# Patient Record
Sex: Female | Born: 1940
Health system: Southern US, Community
[De-identification: ages and names within clinical notes are randomized; demographics above are authoritative.]

## PROBLEM LIST (undated history)

## (undated) DIAGNOSIS — F32A Depression, unspecified: Secondary | ICD-10-CM

## (undated) DIAGNOSIS — Z973 Presence of spectacles and contact lenses: Secondary | ICD-10-CM

## (undated) DIAGNOSIS — D126 Benign neoplasm of colon, unspecified: Secondary | ICD-10-CM

## (undated) DIAGNOSIS — E210 Primary hyperparathyroidism: Secondary | ICD-10-CM

## (undated) DIAGNOSIS — K760 Fatty (change of) liver, not elsewhere classified: Secondary | ICD-10-CM

## (undated) DIAGNOSIS — T4145XA Adverse effect of unspecified anesthetic, initial encounter: Secondary | ICD-10-CM

## (undated) DIAGNOSIS — I10 Essential (primary) hypertension: Secondary | ICD-10-CM

## (undated) DIAGNOSIS — N189 Chronic kidney disease, unspecified: Secondary | ICD-10-CM

## (undated) DIAGNOSIS — Z972 Presence of dental prosthetic device (complete) (partial): Secondary | ICD-10-CM

## (undated) DIAGNOSIS — T8859XA Other complications of anesthesia, initial encounter: Secondary | ICD-10-CM

## (undated) DIAGNOSIS — M1990 Unspecified osteoarthritis, unspecified site: Secondary | ICD-10-CM

## (undated) DIAGNOSIS — K219 Gastro-esophageal reflux disease without esophagitis: Secondary | ICD-10-CM

## (undated) DIAGNOSIS — E785 Hyperlipidemia, unspecified: Secondary | ICD-10-CM

## (undated) DIAGNOSIS — E119 Type 2 diabetes mellitus without complications: Secondary | ICD-10-CM

## (undated) DIAGNOSIS — R55 Syncope and collapse: Secondary | ICD-10-CM

## (undated) DIAGNOSIS — K589 Irritable bowel syndrome without diarrhea: Secondary | ICD-10-CM

## (undated) DIAGNOSIS — K648 Other hemorrhoids: Secondary | ICD-10-CM

## (undated) DIAGNOSIS — F329 Major depressive disorder, single episode, unspecified: Secondary | ICD-10-CM

## (undated) DIAGNOSIS — F419 Anxiety disorder, unspecified: Secondary | ICD-10-CM

## (undated) HISTORY — PX: OTHER SURGICAL HISTORY: SHX169

## (undated) HISTORY — DX: Depression, unspecified: F32.A

## (undated) HISTORY — DX: Benign neoplasm of colon, unspecified: D12.6

## (undated) HISTORY — PX: ANKLE FRACTURE SURGERY: SHX122

## (undated) HISTORY — DX: Unspecified osteoarthritis, unspecified site: M19.90

## (undated) HISTORY — DX: Gastro-esophageal reflux disease without esophagitis: K21.9

## (undated) HISTORY — DX: Hyperlipidemia, unspecified: E78.5

## (undated) HISTORY — DX: Chronic kidney disease, unspecified: N18.9

## (undated) HISTORY — DX: Type 2 diabetes mellitus without complications: E11.9

## (undated) HISTORY — DX: Major depressive disorder, single episode, unspecified: F32.9

## (undated) HISTORY — PX: TONSILLECTOMY: SUR1361

## (undated) HISTORY — DX: Irritable bowel syndrome, unspecified: K58.9

## (undated) HISTORY — DX: Other hemorrhoids: K64.8

## (undated) HISTORY — DX: Essential (primary) hypertension: I10

## (undated) HISTORY — DX: Fatty (change of) liver, not elsewhere classified: K76.0

## (undated) HISTORY — DX: Anxiety disorder, unspecified: F41.9

## (undated) HISTORY — PX: COLONOSCOPY W/ BIOPSIES AND POLYPECTOMY: SHX1376

---

## 1962-08-08 HISTORY — PX: VAGINAL HYSTERECTOMY: SUR661

## 1997-08-08 DIAGNOSIS — D126 Benign neoplasm of colon, unspecified: Secondary | ICD-10-CM

## 1997-08-08 HISTORY — DX: Benign neoplasm of colon, unspecified: D12.6

## 1998-03-19 ENCOUNTER — Other Ambulatory Visit: Admission: RE | Admit: 1998-03-19 | Discharge: 1998-03-19 | Payer: Self-pay | Admitting: Internal Medicine

## 2005-05-11 ENCOUNTER — Ambulatory Visit: Payer: Self-pay | Admitting: Internal Medicine

## 2006-11-11 ENCOUNTER — Encounter: Admission: RE | Admit: 2006-11-11 | Discharge: 2006-11-11 | Payer: Self-pay | Admitting: Orthopedic Surgery

## 2006-12-07 ENCOUNTER — Encounter: Admission: RE | Admit: 2006-12-07 | Discharge: 2006-12-07 | Payer: Self-pay | Admitting: Orthopedic Surgery

## 2007-01-04 ENCOUNTER — Ambulatory Visit: Payer: Self-pay | Admitting: Internal Medicine

## 2007-01-18 ENCOUNTER — Ambulatory Visit: Payer: Self-pay | Admitting: Internal Medicine

## 2007-01-18 ENCOUNTER — Encounter: Payer: Self-pay | Admitting: Internal Medicine

## 2007-08-09 HISTORY — PX: LAMINECTOMY: SHX219

## 2007-10-29 ENCOUNTER — Encounter: Admission: RE | Admit: 2007-10-29 | Discharge: 2007-10-29 | Payer: Self-pay | Admitting: Orthopedic Surgery

## 2007-12-25 ENCOUNTER — Ambulatory Visit: Payer: Self-pay | Admitting: Cardiology

## 2008-03-19 ENCOUNTER — Inpatient Hospital Stay (HOSPITAL_COMMUNITY): Admission: RE | Admit: 2008-03-19 | Discharge: 2008-03-21 | Payer: Self-pay | Admitting: Orthopedic Surgery

## 2009-03-19 ENCOUNTER — Encounter: Admission: RE | Admit: 2009-03-19 | Discharge: 2009-03-19 | Payer: Self-pay | Admitting: Family Medicine

## 2010-04-19 ENCOUNTER — Encounter: Admission: RE | Admit: 2010-04-19 | Discharge: 2010-04-19 | Payer: Self-pay | Admitting: Family Medicine

## 2010-08-30 ENCOUNTER — Encounter: Payer: Self-pay | Admitting: Family Medicine

## 2010-12-21 NOTE — Op Note (Signed)
NAME:  Carolyn [name], Carolyn [name]                 ACCOUNT NO.:  [id number]   MEDICAL RECORD NO.:  [id number]          PATIENT TYPE:  INP   LOCATION:  0005                         FACILITY:  [facility]   PHYSICIAN:  Georges Lynch. Gioffre, M.D.DATE OF BIRTH:  [birth year]   DATE OF PROCEDURE:  03/19/2008  DATE OF DISCHARGE:                               OPERATIVE REPORT   SURGEON:  Georges Lynch. Darrelyn Hillock, M.D.   ASSISTANT:  Marlowe Kays, M.D.   PREOPERATIVE DIAGNOSES:  1. Severe spinal stenosis at L4-5.  2. Questionable herniated disk at L4-5 on the left.  3. Foraminal stenosis at L4-5 for the L4 root and L5 root bilaterally.   POSTOPERATIVE DIAGNOSES:  1. Severe spinal stenosis at L4-5.  2. Questionable herniated disk at L4-5 on the left.  3. Foraminal stenosis at L4-5 for the L4 root and L5 root bilaterally.   OPERATION:  1. Complete decompression of L4 spinous process and the lamina.  2. Hemilaminectomy bilaterally of L5.  3. Foraminotomies for the L5 root and the L4 root bilaterally.   PROCEDURE IN DETAIL:  Under general anesthesia with the patient on  spinal frame, routine orthopedic prep and draping of the lower back was  carried out.  She had 2 g of IV Ancef preop.  At this time two needles  were placed in the back for localization purposes.  X-ray was taken to  verify the position.  Incision then was made over the L4-5 interspace  and extended distally.  Also extended the incision somewhat proximal.  I  then stripped the muscle from the spinous process and lamina  bilaterally.  Another x-ray was taken to verify our position.  Several x-  rays were taken to verify the exact L4-5 position.  At this time I then  removed the spinous process of L4, part of L3.  I did a complete  decompression by removing the complete lamina of L4.  I then went down  and did a hemilaminectomy bilaterally of L5.  A microscope was brought  in at this time and we identified the ligamentum flavum.  We removed  that with  great care not to injure the underlying dura.  Note she had  rather severe thickening of the ligamentum flavum at the L4-5 space.  When the decompression was carried out proximally and distally we  utilized a hockey-stick to go proximally and distally to make sure there  was no further blockage proximally or distally.  We completed the  procedure and it was noted the dura now was free.  We then went out  laterally and decompressed the foramina bilaterally.  We then examined  the L4-5 space.  She had a pseudo spondylolisthesis at L4-5.  An  instrument was placed at the space and we took an x-ray to verify this  position.  Note there was no soft disk herniation.  This was hard thick  bone and ligamentum flavum that was causing the indentation on the left  side.  We had the roots decompressed thoroughly bilaterally.  The dura  now was thoroughly decompressed.  We thoroughly irrigated out  the area,  removed the fluid and then loosely applied some FloSeal.  Following that  I then utilized some thrombin-soaked  Gelfoam out laterally.  The wound then was closed in layers in the usual  fashion except I left a small deep distal part of the wound open for  drainage purposes.  Subcu was closed with 0 Vicryl, skin with metal  staples.  A sterile Neosporin dressing was applied.           ______________________________  Georges Lynch Darrelyn Hillock, M.D.     RAG/MEDQ  D:  03/19/2008  T:  03/19/2008  Job:  [id number]   cc:   Donzetta Sprung  Fax: [phone]   Jonelle Sidle, MD  [phone] [address]  [place], [place] [address]

## 2010-12-21 NOTE — Assessment & Plan Note (Signed)
[facility]                          [facility] OFFICE NOTE   Carolyn, [name]                        MRN:          [id number]  DATE:12/25/2007                            DOB:          03/09/1941    REFERRING PHYSICIAN:  Georges Lynch. Darrelyn Hillock, M.D.   REASON FOR VISIT:  Preoperative evaluation.   HISTORY OF PRESENT ILLNESS:  Carolyn [name] is a pleasant 70 year old woman  with a history of hypertension.  She has evidence of spinal stenosis  with bilateral leg pain and is being considered for surgical  decompression and microdiskectomy sometime in July.  She is referred for  a preoperative evaluation.  Speaking with her regarding symptoms, she  denies any typical exertional angina or limiting breathlessness beyond  NYHA class II.  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  She is mainly limited by back and leg pain.  She does  state that she has decreased energy at times, although no chest pain,  and notes that her heart rate has been relatively slow, down in the  40s.  She is on metoprolol 100 mg daily, and has been on this medicine  for many years.  She has had no sense of dizziness, palpitations or  syncope, however.  Her electrocardiogram today shows sinus bradycardia  at 45 beats per minute; otherwise normal.  There is no old tracing for  comparison.   ALLERGIES:  NO KNOWN DRUG ALLERGIES.   PRESENT MEDICATIONS:  1. Benicar HCT 40/12.5 mg p.o. daily.  2. Metoprolol 100 mg p.o. daily.  3. Citalopram 20 mg p.o. daily.  4. Celebrex 10 mg p.o. daily.  5. Nexium 40 mg p.o. daily.  6. Vitamin D daily.  7. Premarin 0.3 mg p.o. every other day.  8. Vicodin p.r.n.  9. Alprazolam p.r.n.   PAST MEDICAL HISTORY:  As outlined above.  No history of diabetes  mellitus or significant hyperlipidemia.  Thyroid status has been normal  with TSH of 1.42 back in 2005 based on available records.  She is status  post hysterectomy and  has history of previous ankle fracture.   SOCIAL HISTORY:  The patient is married.  She denies any recent tobacco  use.  She quit smoking greater than 20 years ago.  No history of regular  alcohol use.  No regular exercise.   FAMILY HISTORY:  Family history is reviewed.  Significant for type 2  diabetes mellitus.  No premature cardiovascular disease noted.   REVIEW OF SYSTEMS:  As outlined above.  Otherwise negative.   PHYSICAL EXAMINATION:  VITAL SIGNS:  Blood pressure is 135/78, heart  rate is 48, weight is 186 pounds.  GENERAL:  A normally nourished woman in no acute distress.  HEENT:  Conjunctivae normal.  Oropharynx is clear.  NECK:  Supple.  No elevated jugular venous pressure, no loud bruits, no  thyromegaly.  LUNGS:  Clear without labored breathing.  CARDIAC:  Regular rate and rhythm.  No significant murmur or S3 gallop.  No pericardial rub.  ABDOMEN:  Soft, nontender.  Normoactive  bowel sounds.  EXTREMITIES:  Significant for no frank pitting edema.  Distal pulses are  2+.  SKIN:  Warm and dry.  MUSCULOSKELETAL:  No kyphosis noted.  NEUROPSYCHIATRIC:  The patient is alert and oriented x3.  Affect is  appropriate.   IMPRESSION AND RECOMMENDATIONS:  Preoperative evaluation in a 66-year-  old woman with a history of hypertension and relative bradycardia.  She  has had no dizziness or syncope, but I suspect some of her decrease in  energy and exercise tolerance is related to this.  She is not reporting  any angina or heart failure symptoms, and her electrocardiogram  otherwise is reassuring.  I would anticipate that she should be able to  proceed with planned spine surgery under general anesthesia with an  acceptable perioperative cardiac risk.  I would like her to decrease her  metoprolol dose to 50 mg daily.  She plans to see Dr. Reuel Boom for a  routine visit also in the near future.  I have asked her to keep an eye  on her heart rate going forward in case she needs further  medication  adjustments.  We can certainly see her in the perioperative setting as  needed.     Jonelle Sidle, MD  Electronically Signed    SGM/MedQ  DD: 12/25/2007  DT: 12/25/2007  Job #: [id number]   cc:   Georges Lynch. Darrelyn Hillock, M.D.  Donzetta Sprung

## 2010-12-24 NOTE — H&P (Signed)
NAME:  Carolyn, [name]                 ACCOUNT NO.:  [id number]   MEDICAL RECORD NO.:  [id number]         PATIENT TYPE:  LINP   LOCATION:                               FACILITY:  [facility]   PHYSICIAN:  Georges Lynch. Gioffre, M.D.DATE OF BIRTH:  Jul 03, 1941   DATE OF ADMISSION:  03/19/2008  DATE OF DISCHARGE:                              HISTORY & PHYSICAL   CHIEF COMPLAINT:  Lower back, bilateral leg pain.   HISTORY OF PRESENT ILLNESS:  [name] is a 70 year old female here  today for history and physical for admission due to lower back and  bilateral leg pain.  Patient has had this issue going on for while, she  has had evaluations which include CT myelogram.  The patient was found  to have spinal stenosis L4-5 with a herniated disk at L4-5 on the left.  The patient has elected to proceed with central decompression at L4-5  with a microdiskectomy at L4-5 on the left.   ALLERGIES:  No known drug allergies.   CURRENT MEDICATIONS:  1. Benicar 40/12.5 mg once a day.  2. Nexium 40 mg once a day.  3. Celebrex 200 mg once a day.  4. Premarin 0.3 mg once a day.  5. Celexa 20 mg once a day.  6. Ultram p.r.n.  7. Alprazolam 1/2 tablet of a 0.25 mg p.r.n.   PAST MEDICAL HISTORY:  1. Hypertension.  2. Reflux disease.  3. Hemorrhoids.  4. Depression.  5. Spinal stenosis of her lumbar spine with an HNP L4-5.  6. Upper dentures.   PRIMARY CARE PHYSICIAN:  Dr. Donzetta Sprung has evaluated the patient and  cleared for this upcoming surgical procedure.  His office is in [place],  [place].   PAST SURGICAL HISTORY:  1. Broken ankle.  2. Hysterectomy without any complications from anesthesia.   FAMILY MEDICAL HISTORY:  Father is deceased at the age of 8 from  suicide.  Mother is deceased at 63 from Alzheimer's.   SOCIAL HISTORY:  The patient is married.  No history of alcohol or drug  use.  She has smoked in the past.  She has got three grown children.  She lives in a [place] house.   REVIEW OF SYSTEMS:  Negative for any other neurologic issues other than  some well-controlled depression.  She denies any respiratory issues.  No  cardiovascular issues.  No GI or GU issues.  Negative for endocrine.  Negative for hematologic.   PHYSICAL EXAMINATION:  VITALS:  Height is 5 feet 7.  Weight is 180  pounds.  Blood pressure is 120/78, pulse of 70 and regular, respirations  12 nonlabored, patient is afebrile.  GENERAL:  This is a healthy-appearing, well-developed female conscious,  alert and appropriately, appears to be a good historian.  Easily gets on  and off the exam table.  She is in no distress.  HEENT:  Head was normocephalic.  Pupils equal, round and reactive.  Gross hearing is intact.  NECK:  Supple.  No palpable lymphadenopathy.  Excellent range of motion.  CHEST:  Lung sounds clear and equal bilaterally.  HEART:  Regular rate and rhythm.  ABDOMEN:  Soft, nontender.  No CVA region tenderness.  EXTREMITIES:  Upper extremities had excellent range of motion with good  motor strength.  Lower extremities:  She had full range of motion of  both hips, knees and ankles today without any difficulty.  NEUROLOGIC:  Examination of her back, she had minimal hyperextension due  to increased lower back and buttocks and posterior thigh pain.  She was  able to flex forward with decreased pain to about 70 degrees, bending  right and left did cause lower back soreness.  She has intact light  touch sensation and motor strength in bilateral lower extremities.  PERIPHERAL VASCULAR:  Carotid pulses were 2+ no bruits.  Radial pulses  2+, dorsalis pedis pulses 2+.  She had no lower extremity dermatologic  changes or venostasis changes.  BREAST, RECTAL AND GU:  Exams were deferred at this time.   IMPRESSION:  1. Spinal stenosis L4-5 with a herniated disk L4-5 on the left.  2. Hypertension.  3. Depression.  4. Reflux disease.  5. Hemorrhoids.  6. Dentures, upper.   PLAN:  The patient  will undergo all routine labs and tests prior to  having a central decompression at the L4-5 level with L4-5 left  microdiskectomy.      Jamelle Rushing, P.A.    ______________________________  Georges Lynch Darrelyn Hillock, M.D.    RWK/MEDQ  D:  02/28/2008  T:  02/28/2008  Job:  [id number]

## 2010-12-24 NOTE — Discharge Summary (Signed)
NAMEMarland Kitchen  Carolyn [name], Carolyn [name]                 ACCOUNT NO.:  [id number]   MEDICAL RECORD NO.:  [id number]          PATIENT TYPE:  INP   LOCATION:  1535                         FACILITY:  [facility]   PHYSICIAN:  Georges Lynch. Gioffre, M.D.DATE OF BIRTH:  1941/07/13   DATE OF ADMISSION:  03/19/2008  DATE OF DISCHARGE:  03/21/2008                               DISCHARGE SUMMARY   ADMISSION DIAGNOSES:  1. Spinal stenosis of L4-L5 with herniated disk at L4-5 on the left.  2. Hypertension.  3. Depression.  4. Reflux disease.  5. Hemorrhoids.  6. Upper dentures.   DISCHARGE DIAGNOSES:  1. Decompression and lumbar laminectomy at L4, complete,      hemilaminectomy at L5 with foraminotomies bilaterally, two levels.  2. Hypertension.  3. Depression.  4. Reflux.  5. Hemorrhoids.  6. Dentures.   HISTORY OF PRESENT ILLNESS:  A 70 year old female with a history of  lower back and bilateral leg pain.  The patient was evaluated with a CT  myelogram that showed she had spinal stenosis at L5 and a herniated disk  at L5 on the left.  The patient elected to proceed with surgical  intervention.   ALLERGIES:  NO KNOWN DRUG ALLERGIES.   MEDICATIONS ON ADMISSION:  1. Benicar 40/12.5 mg once a day.  2. Nexium 40 mg a day.  3. Celebrex 200 mg a day.  4. Premarin 0.3 mg once a day.  5. Celexa 20 mg once a day.  6. __________ p.r.n.  7. Alprazolam 0.125 mg p.r.n.   SURGICAL PROCEDURE:  On March 19, 2008 the patient was taken to the OR  by Windy Fast A. Darrelyn Hillock, M.D. and assisted by Marlowe Kays, M.D.  Under  general anesthesia the patient underwent a complete decompressive lumbar  laminectomy at L4 with a hemilaminectomy at L5 with foraminotomies  bilaterally at two levels.  The patient tolerated the procedure well  without complications.  Estimated blood loss was 50 mL.  The patient was  transferred to the recovery room and then to the orthopedic floor in  good condition.   CONSULTS:  The following consults  were requested with physical therapy  and case management.   HOSPITAL COURSE:  On March 19, 2008 the patient was admitted to [facility] and taken to the operating room for the complete  decompressive lumbar laminectomy performed at L4 with a hemilaminectomy  at L5 with foraminotomies bilaterally at two levels.  The patient  tolerated the procedure and was transferred to the recovery room and  then to the orthopedic floor in good condition.  __________.  The  patient was admitted to __________ postoperatively __________ very well  and she had some lower back soreness when she was up and about.  Her  legs were neurologically intact.  She was able to void.  She was able to  participate in physical therapy.  On postop day #2, the patient was  orthopedically and medically stable and ready for discharge home.  The  patient was discharged home with the follow up instructions.   LABORATORY DATA:  CBC preop WBC at 4.2,  hemoglobin __________, platelets  258.  Discharge hemoglobin __________.  __________.  Glucose 156.  __________.  Urinalysis was normal.  EKG showed sinus rhythm, 60 beats  per minute.  Chest x-ray on admission showed no active disease.   DISCHARGE INSTRUCTIONS:  1. Diet:  No restrictions.  2. Activity:  The patient is to ambulate with the use of a walker.  3. Wound care:  The patient is to change her dressing daily.  4. Follow up:  The patient is to follow up with Windy Fast A. Gioffre,      M.D. in 2 weeks from date of discharge.  The patient is to follow      up with __________ follow up appointment.   MEDICATIONS:  1. Percocet 10/650 one tablet every 4-6 hours for pain as needed.  2. Robaxin 500 mg one tablet every 6 hours for muscle spasms if      needed.  3. Benicar/hydrochlorothiazide 40/12.5 mg once a day.  4. Nexium 40 mg a day.  5. Premarin 0.3 mg once a day.  6. Celebrex 200 mg once a day.  7. Citalopram 20 mg once a day.  8. Alprazolam 0.25 mg every 8  hours as needed.  9. Tramadol HCl 50 mg on hold until complete Percocet.  10.Vitamin D 1000 mg in the a.m. if needed.  11.Omega III capsule 2 tablets a day.   The patient's condition upon discharge home is improved and good.      Jamelle Rushing, P.A.    ______________________________  Georges Lynch Darrelyn Hillock, M.D.    RWK/MEDQ  D:  04/16/2008  T:  04/16/2008  Job:  [id number]

## 2011-04-04 ENCOUNTER — Other Ambulatory Visit: Payer: Self-pay | Admitting: Orthopedic Surgery

## 2011-04-04 ENCOUNTER — Ambulatory Visit (HOSPITAL_COMMUNITY)
Admission: RE | Admit: 2011-04-04 | Discharge: 2011-04-04 | Disposition: A | Discharge status: Home / Self Care | Payer: Medicare Other | Source: Ambulatory Visit | Attending: Orthopedic Surgery | Admitting: Orthopedic Surgery

## 2011-04-04 ENCOUNTER — Other Ambulatory Visit (HOSPITAL_COMMUNITY): Payer: Self-pay | Admitting: Orthopedic Surgery

## 2011-04-04 ENCOUNTER — Encounter (HOSPITAL_COMMUNITY): Payer: Medicare Other

## 2011-04-04 DIAGNOSIS — M5126 Other intervertebral disc displacement, lumbar region: Secondary | ICD-10-CM

## 2011-04-04 DIAGNOSIS — I498 Other specified cardiac arrhythmias: Secondary | ICD-10-CM | POA: Insufficient documentation

## 2011-04-04 DIAGNOSIS — Z01818 Encounter for other preprocedural examination: Secondary | ICD-10-CM | POA: Insufficient documentation

## 2011-04-04 DIAGNOSIS — Z01812 Encounter for preprocedural laboratory examination: Secondary | ICD-10-CM | POA: Insufficient documentation

## 2011-04-04 DIAGNOSIS — Q762 Congenital spondylolisthesis: Secondary | ICD-10-CM | POA: Insufficient documentation

## 2011-04-04 DIAGNOSIS — I4510 Unspecified right bundle-branch block: Secondary | ICD-10-CM | POA: Insufficient documentation

## 2011-04-04 DIAGNOSIS — M47817 Spondylosis without myelopathy or radiculopathy, lumbosacral region: Secondary | ICD-10-CM | POA: Insufficient documentation

## 2011-04-04 DIAGNOSIS — Z01810 Encounter for preprocedural cardiovascular examination: Secondary | ICD-10-CM | POA: Insufficient documentation

## 2011-04-04 DIAGNOSIS — I10 Essential (primary) hypertension: Secondary | ICD-10-CM | POA: Insufficient documentation

## 2011-04-04 LAB — CBC
HCT: 38.9 % (ref 36.0–46.0)
Hemoglobin: 13.2 g/dL (ref 12.0–15.0)
MCH: 30.3 pg (ref 26.0–34.0)
MCHC: 33.9 g/dL (ref 30.0–36.0)
MCV: 89.4 fL (ref 78.0–100.0)
Platelets: 227 10*3/uL (ref 150–400)
RBC: 4.35 MIL/uL (ref 3.87–5.11)
RDW: 12.2 % (ref 11.5–15.5)
WBC: 5.2 10*3/uL (ref 4.0–10.5)

## 2011-04-04 LAB — URINALYSIS, ROUTINE W REFLEX MICROSCOPIC
Bilirubin Urine: NEGATIVE
Glucose, UA: NEGATIVE mg/dL
Hgb urine dipstick: NEGATIVE
Ketones, ur: NEGATIVE mg/dL
Leukocytes, UA: NEGATIVE
Nitrite: NEGATIVE
Protein, ur: NEGATIVE mg/dL
Specific Gravity, Urine: 1.018 (ref 1.005–1.030)
Urobilinogen, UA: 0.2 mg/dL (ref 0.0–1.0)
pH: 6 (ref 5.0–8.0)

## 2011-04-04 LAB — COMPREHENSIVE METABOLIC PANEL
ALT: 35 U/L (ref 0–35)
AST: 30 U/L (ref 0–37)
Albumin: 3.7 g/dL (ref 3.5–5.2)
Alkaline Phosphatase: 86 U/L (ref 39–117)
BUN: 15 mg/dL (ref 6–23)
CO2: 30 mEq/L (ref 19–32)
Calcium: 11 mg/dL — ABNORMAL HIGH (ref 8.4–10.5)
Chloride: 97 mEq/L (ref 96–112)
Creatinine, Ser: 0.83 mg/dL (ref 0.50–1.10)
GFR calc Af Amer: >60 mL/min (ref >60)
GFR calc non Af Amer: >60 mL/min (ref >60)
Glucose, Bld: 199 mg/dL — ABNORMAL HIGH (ref 70–99)
Potassium: 4 mEq/L (ref 3.5–5.1)
Sodium: 132 mEq/L — ABNORMAL LOW (ref 135–145)
Total Bilirubin: 0.5 mg/dL (ref 0.3–1.2)
Total Protein: 7.1 g/dL (ref 6.0–8.3)

## 2011-04-04 LAB — SURGICAL PCR SCREEN
MRSA, PCR: NEGATIVE
Staphylococcus aureus: NEGATIVE

## 2011-04-04 LAB — DIFFERENTIAL
Basophils Absolute: 0 10*3/uL (ref 0.0–0.1)
Basophils Relative: 0 % (ref 0–1)
Eosinophils Absolute: 0.2 10*3/uL (ref 0.0–0.7)
Eosinophils Relative: 3 % (ref 0–5)
Lymphocytes Relative: 27 % (ref 12–46)
Lymphs Abs: 1.4 10*3/uL (ref 0.7–4.0)
Monocytes Absolute: 0.5 10*3/uL (ref 0.1–1.0)
Monocytes Relative: 10 % (ref 3–12)
Neutro Abs: 3.1 10*3/uL (ref 1.7–7.7)
Neutrophils Relative %: 60 % (ref 43–77)

## 2011-04-04 LAB — PROTIME-INR
INR: 0.95 (ref 0.00–1.49)
Prothrombin Time: 12.9 seconds (ref 11.6–15.2)

## 2011-04-04 LAB — APTT: aPTT: 29 seconds (ref 24–37)

## 2011-04-09 HISTORY — PX: BACK SURGERY: SHX140

## 2011-04-13 ENCOUNTER — Inpatient Hospital Stay (HOSPITAL_COMMUNITY): Payer: Medicare Other

## 2011-04-13 ENCOUNTER — Ambulatory Visit (HOSPITAL_COMMUNITY)
Admission: RE | Admit: 2011-04-13 | Discharge: 2011-04-14 | Disposition: A | Discharge status: Home / Self Care | Payer: Medicare Other | Source: Ambulatory Visit | Attending: Orthopedic Surgery | Admitting: Orthopedic Surgery

## 2011-04-13 ENCOUNTER — Other Ambulatory Visit: Payer: Self-pay | Admitting: Orthopedic Surgery

## 2011-04-13 DIAGNOSIS — Z79899 Other long term (current) drug therapy: Secondary | ICD-10-CM | POA: Insufficient documentation

## 2011-04-13 DIAGNOSIS — Z01818 Encounter for other preprocedural examination: Secondary | ICD-10-CM | POA: Insufficient documentation

## 2011-04-13 DIAGNOSIS — I10 Essential (primary) hypertension: Secondary | ICD-10-CM | POA: Insufficient documentation

## 2011-04-13 DIAGNOSIS — M5126 Other intervertebral disc displacement, lumbar region: Principal | ICD-10-CM | POA: Insufficient documentation

## 2011-04-13 DIAGNOSIS — Z01810 Encounter for preprocedural cardiovascular examination: Secondary | ICD-10-CM | POA: Insufficient documentation

## 2011-04-13 DIAGNOSIS — Z01812 Encounter for preprocedural laboratory examination: Secondary | ICD-10-CM | POA: Insufficient documentation

## 2011-04-13 DIAGNOSIS — K219 Gastro-esophageal reflux disease without esophagitis: Secondary | ICD-10-CM | POA: Insufficient documentation

## 2011-04-13 DIAGNOSIS — M79609 Pain in unspecified limb: Secondary | ICD-10-CM | POA: Insufficient documentation

## 2011-04-13 HISTORY — PX: LUMBAR DISC SURGERY: SHX700

## 2011-04-13 LAB — TYPE AND SCREEN
ABO/RH(D): O POS
Antibody Screen: NEGATIVE

## 2011-04-13 LAB — ABO/RH: ABO/RH(D): O POS

## 2011-04-15 NOTE — Discharge Summary (Signed)
  [name], Carolyn [name]                 ACCOUNT NO.:  [id number]  MEDICAL RECORD NO.:  [id number]  LOCATION:  1615                         FACILITY:  [facility]  PHYSICIAN:  Georges Lynch. Naome Brigandi, M.D.DATE OF BIRTH:  1940-12-22  DATE OF ADMISSION:  04/13/2011 DATE OF DISCHARGE:  04/14/2011                              DISCHARGE SUMMARY   The patient was taken to surgery on April 13, 2011, at which time I did a decompressive lumbar laminectomy and a microdiskectomy as explained in my operative note.  She had significant radicular pain preop.  Postop day 1, she had no complaints of back pain or leg pain and she felt totally free of her discomfort.  She has voided.  She can up ambulating.  PERTINENT LABORATORY STUDIES:  The screening test for MRSA and Staph were negative.  Platelet count 227, white count 5200, hemoglobin was 13.2, and hematocrit 38.9 with a normal differential.  Her sodium was 132; potassium 4; chloride 97; glucose 199, was elevated; BUN 15, creatinine 0.83; and calcium 11.0, which is slightly elevated.  The SGOT was normal, SGPT normal, alkaline phosphatase normal, and bilirubin 0.5. INR was 0.95.  The PT was 12.9, PTT was 29.  The urinalysis was within the normal limits.  She had a chest x-ray that shows no active disease. She had lumbar spine x-ray that showed flat lumbar vertebrae with a pseudospondylolisthesis of L4 and L5.  Her electrocardiogram showed a sinus bradycardia, incomplete right bundle-branch block.  DISCHARGE CONDITION:  Improved.  DISCHARGE DIET:  She will resume her preop discharge diet.  DISCHARGE MEDICATIONS:  All listed on the discharge manager.  DISCHARGE CONDITION:  I said was improved.  DISCHARGE DIAGNOSIS:  A spinal stenosis at L3-4 with a large herniated disk at L3-4 on the right.  All her pain was in the right lower preoperatively.  DISCHARGE INSTRUCTIONS:  See me office in 2 weeks or prior to any problem and also given the proper discharge  instructions on the care of the wound.          ______________________________ Georges Lynch Darrelyn Hillock, M.D.     RAG/MEDQ  D:  04/14/2011  T:  04/14/2011  Job:  [id number]  Electronically Signed by Ranee Gosselin M.D. on 04/15/2011 08:27:08 AM

## 2011-04-15 NOTE — Op Note (Signed)
Carolyn [name], Carolyn [name]                 ACCOUNT NO.:  [id number]  MEDICAL RECORD NO.:  [id number]  LOCATION:  1615                         FACILITY:  [facility]  PHYSICIAN:  Georges Lynch. Diamantina Edinger, M.D.DATE OF BIRTH:  1941/01/09  DATE OF PROCEDURE:  04/13/2011 DATE OF DISCHARGE:                              OPERATIVE REPORT   SURGEON:  Georges Lynch. Darrelyn Hillock, M.D.  ASSISTANT:  Jene Every, M.D.  PREOPERATIVE DIAGNOSES: 1. Severe spinal stenosis at L3-L4. 2. Large central into the right herniated disk at L3-L4.  POSTOPERATIVE DIAGNOSES: 1. Severe spinal stenosis at L3-L4. 2. Large central into the right herniated disk at L3-L4.  OPERATION: 1. A complete decompressive lumbar laminectomy at L3-L4 for severe     spinal stenosis. 2. Microdiskectomy at L3-L4 central into the right for a large     herniated disk.  Note preop, her main issue was severe pain in the right leg, but when she ambulated she had obvious intermittent claudication.  The procedure under general anesthesia, routine orthopedic prep and draping of the lower back was carried out.  Appropriate timeout was carried out.  Note, the timeout was carried out prior to inserting the needles or any incisions.  I also marked the appropriate right side of the back preop in the holding area because she would mainly have right leg pain.  PROCEDURE IN DETAIL:  Under general anesthesia, routine orthopedic prep and drape was carried out.  The patient was on spinal frame.  This time, 2 needles were placed at the back after the timeout was carried out.  X- ray was taken to verify the position.  Incision then was made over L3- L4, L4-L5 spaces.  Note, the bleeder was identified and cauterized.  The muscle was separated from the lamina and spinous process bilaterally. Self-retaining retractors were inserted, identified what I felt was the L3 spinous process.  Another x-ray was taken with the instrument in place.  Following that, I then carried  out my central decompressive lumbar laminectomy in the usual fashion.  She had previous surgery on April 5, she had a lot of scar tissue there distally, we had to dissect that off.  We then identified clearly the spinous process and lamina of L3.  I then went down in the usual fashion to do our decompressive lumbar laminectomy.  We went far out laterally in both recesses and decompressed both recesses.  We then went over more to the right side since the disk was central to the right, and identified the root on this side, the L4 root, gently retracted it, cauterized the lateral recess veins.  We then went up and did a foraminotomy for the three root above and a four root below as well.  At this particular time, we made sure, the root was free with a large herniated disk noted.  Cruciate incision was made at this space.  This was placed in the disk space, another x- ray was taken to verify the L3-L4 position.  We then removed multiple pieces of disk that mainly were subligamentous.  We utilized a nerve hook and the Epstein curettes to go across the midline, decompressed the disk into the disk space.  We went proximally and distally and laterally as well as medially, and removed all the disk fragments as well as from the space.  Space was narrow.  After the procedure was completed, we did send the disk material as a specimen to Pathology.  Also we noted the dura now was easily movable without any question at all as well as the nerve root of L4, as well as the foramina now was wide open for both the L3 and L4 root.  We thoroughly irrigated out the area.  We had a nice decompression.  We also went distally somewhat in the midline and removed the underlying scar tissue as well.  We then thoroughly irrigated out the wound.  I loosely applied some thrombin-soaked Gelfoam, closed the wound layers in usual fashion, except I left a small deep distal and proximal part of the wound open for drainage  purposes. Subcu was closed with 0 Vicryl.  The skin was closed with metal staples and sterile Neosporin dressing was applied.  She did have 2 g of IV Ancef preop.          ______________________________ Georges Lynch. Darrelyn Hillock, M.D.     RAG/MEDQ  D:  04/13/2011  T:  04/13/2011  Job:  [id number]  cc:   Jene Every, M.D. Fax: [phone]  Electronically Signed by Ranee Gosselin M.D. on 04/15/2011 08:27:06 AM

## 2011-04-23 NOTE — H&P (Signed)
NAMEMarland Kitchen  Carolyn, [name] NO.:  [id number]  MEDICAL RECORD NO.:  [id number]  LOCATION:                               FACILITY:  [facility]  PHYSICIAN:  Jaquelyn Bitter. Chabon, P.A.DATE OF BIRTH:  12-23-1940  DATE OF ADMISSION:  04/13/2011 DATE OF DISCHARGE:                             HISTORY & PHYSICAL   DATE OF SURGERY:  April 13, 2011  ADMISSION DIAGNOSIS:  Herniated disk and spinal stenosis, lumbar spine.  HISTORY OF PRESENT ILLNESS:  This is a 70 year old lady with a long time history of a bulging disk at L3-4 with previous decompression.  She has now an extrusion at L3-4 on the right with right-sided symptoms documented on MRI scan.  At this time, the patient has failed conservative management and is now scheduled for lumbar decompression. Surgery, risks, benefits, and aftercare were discussed with the patient with Dr. Darrelyn Hillock, questions invited and answered, and surgery to go ahead as scheduled. PAST MEDICAL HISTORY:  DRUG ALLERGY TO SULFA WITH HIVES.  CURRENT MEDICATIONS:  Omeprazole 20 mg daily, losartan and hydrochlorothiazide 100/12.5 one daily, citalopram HBr 20 mg daily, Celebrex 200 mg daily, alprazolam 0.25 mg p.r.n., glucosamine daily. She is finishing currently a prednisone dose pack and takes hydrocodone 5/325 one q.6 p.r.n. pain.  PREVIOUS SURGERIES:  Include hysterectomy, open reduction internal fixation of the left ankle, and previous lumbar surgery.  SERIOUS MEDICAL ILLNESSES:  Include hypertension and reflux.  FAMILY HISTORY:  Positive for cancer, diabetes, and coronary artery disease.  SOCIAL HISTORY:  The patient is married.  She is retired.  She lives at home.  She does not smoke and does not drink and is returning home after surgery.  REVIEW OF SYSTEMS:  CENTRAL NERVOUS SYSTEM:  Negative for headache, blurred vision, or dizziness.  PULMONARY: Negative for shortness breath, PND, orthopnea.  CARDIOVASCULAR:  Negative for chest pain,  palpitation. GI: Negative for ulcers, hepatitis.  GU: Negative for urinary tract difficulty.  MUSCULOSKELETAL: Positive as in HPI.  PHYSICAL EXAMINATION:  VITAL SIGNS:  BP 150/96, respirations 16, pulse 78 and regular. GENERAL APPEARANCE:  This is a well-developed well-nourished lady in no acute distress. HEENT: Head normocephalic.  Nose patent.  Ears patent.  Pupils equal, round, and reactive to light.  Throat without injection. NECK:  Supple without adenopathy.  Carotids 2+ without bruit. CHEST:  Clear to auscultation.  No rales or rhonchi. HEART:  Regular rate and rhythm at 78 beats per minute without murmur. ABDOMEN: Soft with active bowel sounds.  No masses or organomegaly. NEUROLOGIC:  The patient is alert and oriented to time, place, and person.  Cranial nerves II-XII grossly intact.  Her DTRs in lower extremity shows DTRs in the patella 2+ and symmetrical.  She has a trace right Achilles and 2+ left Achilles.  Neurovascular status is normal in lower extremity, otherwise negative straight leg raise, decreased LS range of motion with pain.  IMPRESSION:  Herniated nucleus pulposus with extrusion L3-4 lumbar spine.  PLAN:  Decompression LS spine.     Jaquelyn Bitter. Ernestene Kiel.     SJC/MEDQ  D:  03/22/2011  T:  03/23/2011  Job:  [id number]  Electronically Signed by  STEPHEN CHABON P.A. on 04/19/2011 08:57:04 AM Electronically Signed by Ranee Gosselin M.D. on 04/23/2011 07:57:37 AM

## 2011-05-04 ENCOUNTER — Ambulatory Visit: Payer: Medicare Other | Attending: Orthopedic Surgery | Admitting: Physical Therapy

## 2011-05-04 DIAGNOSIS — IMO0001 Reserved for inherently not codable concepts without codable children: Secondary | ICD-10-CM | POA: Insufficient documentation

## 2011-05-04 DIAGNOSIS — M545 Low back pain, unspecified: Secondary | ICD-10-CM | POA: Insufficient documentation

## 2011-05-04 DIAGNOSIS — R5381 Other malaise: Secondary | ICD-10-CM | POA: Insufficient documentation

## 2011-05-04 DIAGNOSIS — R293 Abnormal posture: Secondary | ICD-10-CM | POA: Insufficient documentation

## 2011-05-06 ENCOUNTER — Ambulatory Visit: Payer: Medicare Other | Admitting: *Deleted

## 2011-05-06 LAB — COMPREHENSIVE METABOLIC PANEL
ALT: 29
AST: 29
Albumin: 4
Alkaline Phosphatase: 65
BUN: 9
CO2: 31
Calcium: 10.4
Chloride: 100
Creatinine, Ser: 1.06
GFR calc Af Amer: >60
GFR calc non Af Amer: 52 — ABNORMAL LOW
Glucose, Bld: 136 — ABNORMAL HIGH
Potassium: 4.1
Sodium: 137
Total Bilirubin: 0.8
Total Protein: 6.8

## 2011-05-06 LAB — URINALYSIS, ROUTINE W REFLEX MICROSCOPIC
Bilirubin Urine: NEGATIVE
Glucose, UA: NEGATIVE
Hgb urine dipstick: NEGATIVE
Ketones, ur: NEGATIVE
Nitrite: NEGATIVE
Protein, ur: NEGATIVE
Specific Gravity, Urine: 1.01
Urobilinogen, UA: 0.2
pH: 5.5

## 2011-05-06 LAB — HEMOGLOBIN AND HEMATOCRIT, BLOOD
HCT: 31.7 — ABNORMAL LOW
HCT: 34.3 — ABNORMAL LOW
HCT: 35.2 — ABNORMAL LOW
Hemoglobin: 10.9 — ABNORMAL LOW
Hemoglobin: 11.8 — ABNORMAL LOW
Hemoglobin: 12.1

## 2011-05-06 LAB — CBC
HCT: 39.3
Hemoglobin: 13.6
MCHC: 34.6
MCV: 91.2
Platelets: 238
RBC: 4.3
RDW: 12.2
WBC: 4.2

## 2011-05-06 LAB — DIFFERENTIAL
Basophils Absolute: 0
Basophils Relative: 0
Eosinophils Absolute: 0.1
Eosinophils Relative: 3
Lymphocytes Relative: 29
Lymphs Abs: 1.2
Monocytes Absolute: 0.4
Monocytes Relative: 10
Neutro Abs: 2.5
Neutrophils Relative %: 59

## 2011-05-06 LAB — PROTIME-INR
INR: 1
Prothrombin Time: 13

## 2011-05-06 LAB — APTT: aPTT: 28

## 2011-05-16 ENCOUNTER — Ambulatory Visit: Payer: Medicare Other | Attending: Orthopedic Surgery | Admitting: Physical Therapy

## 2011-05-16 DIAGNOSIS — M545 Low back pain, unspecified: Secondary | ICD-10-CM | POA: Insufficient documentation

## 2011-05-16 DIAGNOSIS — R5381 Other malaise: Secondary | ICD-10-CM | POA: Insufficient documentation

## 2011-05-16 DIAGNOSIS — IMO0001 Reserved for inherently not codable concepts without codable children: Secondary | ICD-10-CM | POA: Insufficient documentation

## 2011-05-16 DIAGNOSIS — R293 Abnormal posture: Secondary | ICD-10-CM | POA: Insufficient documentation

## 2011-10-19 ENCOUNTER — Encounter: Payer: Self-pay | Admitting: Internal Medicine

## 2011-12-05 ENCOUNTER — Encounter: Payer: Self-pay | Admitting: Internal Medicine

## 2012-01-10 ENCOUNTER — Encounter: Payer: Self-pay | Admitting: Internal Medicine

## 2012-01-10 ENCOUNTER — Ambulatory Visit (AMBULATORY_SURGERY_CENTER): Payer: Medicare Other | Admitting: *Deleted

## 2012-01-10 VITALS — Ht 66.0 in | Wt 181.7 lb

## 2012-01-10 DIAGNOSIS — Z1211 Encounter for screening for malignant neoplasm of colon: Secondary | ICD-10-CM

## 2012-01-10 MED ORDER — PEG-KCL-NACL-NASULF-NA ASC-C 100 G PO SOLR: ORAL | Status: DC

## 2012-01-24 ENCOUNTER — Ambulatory Visit (AMBULATORY_SURGERY_CENTER): Payer: Medicare Other | Admitting: Internal Medicine

## 2012-01-24 ENCOUNTER — Encounter: Payer: Self-pay | Admitting: Internal Medicine

## 2012-01-24 VITALS — BP 154/93 | HR 63 | Temp 96.0°F | Resp 14 | Ht 66.0 in | Wt 181.0 lb

## 2012-01-24 DIAGNOSIS — Z860100 Personal history of colon polyps, unspecified: Secondary | ICD-10-CM

## 2012-01-24 DIAGNOSIS — Z1211 Encounter for screening for malignant neoplasm of colon: Secondary | ICD-10-CM

## 2012-01-24 MED ORDER — SODIUM CHLORIDE 0.9 % IV SOLN: 500.0000 mL | INTRAVENOUS | Status: DC

## 2012-01-24 NOTE — Progress Notes (Signed)
Patient did not have preoperative order for IV antibiotic SSI prophylaxis. (G8918)  Patient did not experience any of the following events: a burn prior to discharge; a fall within the facility; wrong site/side/patient/procedure/implant event; or a hospital transfer or hospital admission upon discharge from the facility. (G8907)  

## 2012-01-24 NOTE — Op Note (Signed)
[facility] [address] [place], [place]  [address]  COLONOSCOPY PROCEDURE REPORT  PATIENT:  Carolyn [name], Carolyn [name]  MR#:  [id number] BIRTHDATE:  May 22, 1941, 70 yrs. old  GENDER:  female ENDOSCOPIST:  Hedwig Morton. Juanda Chance, MD REF. BY:  Donzetta Sprung, M.D. PROCEDURE DATE:  01/24/2012 PROCEDURE:  Colonoscopy 19147 ASA CLASS:  Class II INDICATIONS:  history of pre-cancerous (adenomatous) colon polyps adenom. polyp in 1999, 2002 and 2008 MEDICATIONS:   MAC sedation, administered by CRNA, propofol (Diprivan) 150 mg  DESCRIPTION OF PROCEDURE:   After the risks and benefits and of the procedure were explained, informed consent was obtained. Digital rectal exam was performed and revealed no rectal masses. The LB CF-H180AL [id number] endoscope was introduced through the anus and advanced to the cecum, which was identified by the ileocecal valve.  The quality of the prep was good, using MoviPrep.  The instrument was then slowly withdrawn as the colon was fully examined. <<PROCEDUREIMAGES>>  FINDINGS:  Internal Hemorrhoids were found (see image5).  This was otherwise a normal examination of the colon (see image1, image2, image3, and image4).   Retroflexed views in the rectum revealed no abnormalities.    The scope was then withdrawn from the patient and the procedure completed.  COMPLICATIONS:  None ENDOSCOPIC IMPRESSION: 1) Internal hemorrhoids 2) Otherwise normal examination no recurrent polyps RECOMMENDATIONS: 1) High fiber diet.  REPEAT EXAM:  In 10 year(s) for.  ______________________________ Hedwig Morton. Juanda Chance, MD  CC:  n. eSIGNED:   Hedwig Morton. Timber Marshman at 01/24/2012 08:33 AM  Paulene Floor, [phone]

## 2012-01-24 NOTE — Progress Notes (Signed)
Per Shon Hough, CRNA clear mucous coming out of the pt's mouth.  Shon Hough, CRNA requested a towel to place under the pt's mouth.  Shon Hough, CRNA was holding the pt's airway. Maw

## 2012-01-24 NOTE — Progress Notes (Signed)
08:17 pt's heart rate dropped to 26.  Per Shon Hough, CRNA vagal reaction pt was warm and dry.  Dr. Juanda Chance was made aware.  Shon Hough, CRNA administered robinal 0.2 mg IV.  Heart rate gradually increased to 30's and 40's. Maw

## 2012-01-24 NOTE — Patient Instructions (Addendum)
YOU HAD AN ENDOSCOPIC PROCEDURE TODAY AT THE [facility]: Refer to the procedure report that was given to you for any specific questions about what was found during the examination.  If the procedure report does not answer your questions, please call your gastroenterologist to clarify.  If you requested that your care partner not be given the details of your procedure findings, then the procedure report has been included in a sealed envelope for you to review at your convenience later.  YOU SHOULD EXPECT: Some feelings of bloating in the abdomen. Passage of more gas than usual.  Walking can help get rid of the air that was put into your GI tract during the procedure and reduce the bloating. If you had a lower endoscopy (such as a colonoscopy or flexible sigmoidoscopy) you may notice spotting of blood in your stool or on the toilet paper. If you underwent a bowel prep for your procedure, then you may not have a normal bowel movement for a few days.  DIET: Your first meal following the procedure should be a light meal and then it is ok to progress to your normal diet.  A half-sandwich or bowl of soup is an example of a good first meal.  Heavy or fried foods are harder to digest and may make you feel nauseous or bloated.  Likewise meals heavy in dairy and vegetables can cause extra gas to form and this can also increase the bloating.  Drink plenty of fluids but you should avoid alcoholic beverages for 24 hours.  ACTIVITY: Your care partner should take you home directly after the procedure.  You should plan to take it easy, moving slowly for the rest of the day.  You can resume normal activity the day after the procedure however you should NOT DRIVE or use heavy machinery for 24 hours (because of the sedation medicines used during the test).    SYMPTOMS TO REPORT IMMEDIATELY: A gastroenterologist can be reached at any hour.  During normal business hours, 8:30 AM to 5:00 PM Monday through Friday,  call [phone].  After hours and on weekends, please call the GI answering service at [phone] who will take a message and have the physician on call contact you.   Following lower endoscopy (colonoscopy or flexible sigmoidoscopy):  Excessive amounts of blood in the stool  Significant tenderness or worsening of abdominal pains  Swelling of the abdomen that is new, acute  Fever of 100F or higher    FOLLOW UP: If any biopsies were taken you will be contacted by phone or by letter within the next 1-3 weeks.  Call your gastroenterologist if you have not heard about the biopsies in 3 weeks.  Our staff will call the home number listed on your records the next business day following your procedure to check on you and address any questions or concerns that you may have at that time regarding the information given to you following your procedure. This is a courtesy call and so if there is no answer at the home number and we have not heard from you through the emergency physician on call, we will assume that you have returned to your regular daily activities without incident.  SIGNATURES/CONFIDENTIALITY: You and/or your care partner have signed paperwork which will be entered into your electronic medical record.  These signatures attest to the fact that that the information above on your After Visit Summary has been reviewed and is understood.  Full responsibility of the confidentiality   of this discharge information lies with you and/or your care-partner.     

## 2012-01-25 ENCOUNTER — Telehealth: Payer: Self-pay

## 2012-01-25 NOTE — Telephone Encounter (Signed)
No answer

## 2012-02-07 ENCOUNTER — Other Ambulatory Visit: Payer: Self-pay | Admitting: Family Medicine

## 2012-02-07 DIAGNOSIS — N644 Mastodynia: Secondary | ICD-10-CM

## 2012-02-16 ENCOUNTER — Ambulatory Visit
Admission: RE | Admit: 2012-02-16 | Discharge: 2012-02-16 | Disposition: A | Discharge status: Home / Self Care | Payer: Medicare Other | Source: Ambulatory Visit | Attending: Family Medicine | Admitting: Family Medicine

## 2012-02-16 DIAGNOSIS — N644 Mastodynia: Secondary | ICD-10-CM

## 2012-04-12 ENCOUNTER — Other Ambulatory Visit: Payer: Self-pay | Admitting: Family Medicine

## 2012-04-12 DIAGNOSIS — R109 Unspecified abdominal pain: Secondary | ICD-10-CM

## 2012-04-12 DIAGNOSIS — R102 Pelvic and perineal pain: Secondary | ICD-10-CM

## 2012-04-16 ENCOUNTER — Ambulatory Visit
Admission: RE | Admit: 2012-04-16 | Discharge: 2012-04-16 | Disposition: A | Discharge status: Home / Self Care | Payer: Medicare Other | Source: Ambulatory Visit | Attending: Family Medicine | Admitting: Family Medicine

## 2012-04-16 DIAGNOSIS — R102 Pelvic and perineal pain: Secondary | ICD-10-CM

## 2012-04-16 DIAGNOSIS — R109 Unspecified abdominal pain: Secondary | ICD-10-CM

## 2012-04-16 MED ORDER — IOHEXOL 300 MG/ML  SOLN
100.0000 mL | Freq: Once | INTRAMUSCULAR | Status: AC | PRN
  Administered 2012-04-16: 100 mL via INTRAVENOUS

## 2012-05-03 ENCOUNTER — Encounter: Payer: Self-pay | Admitting: Internal Medicine

## 2012-05-03 ENCOUNTER — Telehealth: Payer: Self-pay | Admitting: Internal Medicine

## 2012-05-03 ENCOUNTER — Encounter: Payer: Self-pay | Admitting: *Deleted

## 2012-05-03 NOTE — Telephone Encounter (Signed)
I have reviewed my colonoscopy report. Please send her Anusol HC supp #12, insert 1 qhs till I see her in the office next week.

## 2012-05-03 NOTE — Telephone Encounter (Signed)
Patient calling to report for the last month, she has had a "sore stomach", States it is sore below her belly button. States when she has a bowel movement her stomach hurts. She reports she had a CT that was normal. She also reports her rectum itches and is sore. Last colonoscopy 01/24/12 - normal. Patient wants to see Dr. Juanda Chance. Scheduled on 05/08/12 at 3:45 PM.

## 2012-05-04 MED ORDER — HYDROCORTISONE ACETATE 25 MG RE SUPP: RECTAL | Status: DC

## 2012-05-04 NOTE — Telephone Encounter (Signed)
Patient notified of recommendation. Rx sent. 

## 2012-05-08 ENCOUNTER — Ambulatory Visit (INDEPENDENT_AMBULATORY_CARE_PROVIDER_SITE_OTHER): Payer: Medicare Other | Admitting: Internal Medicine

## 2012-05-08 ENCOUNTER — Encounter: Payer: Self-pay | Admitting: Internal Medicine

## 2012-05-08 VITALS — BP 146/82 | HR 64 | Ht 65.0 in | Wt 181.0 lb

## 2012-05-08 DIAGNOSIS — K648 Other hemorrhoids: Secondary | ICD-10-CM

## 2012-05-08 MED ORDER — HYDROCORTISONE ACETATE 25 MG RE SUPP: RECTAL | Status: DC

## 2012-05-08 NOTE — Patient Instructions (Addendum)
We have sent the following medications to your pharmacy for you to pick up at your convenience: Anusol CC: Dr Donzetta Sprung

## 2012-05-08 NOTE — Progress Notes (Signed)
Carolyn [name] April 27, 1941 MRN [id number]  History of Present Illness:  This is a 71 year old white female with symptomatic internal hemorrhoids. She had an exacerbation of rectal irritation recently and we put her on Anusol-HC suppositories. She had a normal colonoscopy in June 2013 except for tortuous colon and internal hemorrhoids. A CT scan of the abdomen showed her to be post hysterectomy . It also showed constipation, fatty liver and a 1.2 cm splenic artery aneurysm. She had numerous colonoscopies in the past starting in 1991, 1992, 1994, 1999, 2002, and 2008 with findings of adenomatous polyps. She has used the [facility] suppositories for the past week with complete resolution of her rectal irritation. She is not interested in hemorrhoidal band ligation. She denies diarrhea or constipation.  Past Medical History  Diagnosis Date  . Diabetes mellitus   . Hypertension   . Arthritis   . Anxiety   . Depression   . GERD (gastroesophageal reflux disease)   . Hyperlipidemia   . Hepatic steatosis   . Internal hemorrhoid   . Tubular adenoma of colon 1999    colonoscopy  . IBS (irritable bowel syndrome)    Past Surgical History  Procedure Date  . Back surgery 04/2011  . Vaginal hysterectomy 1964    reports that she has never smoked. She has never used smokeless tobacco. She reports that she does not drink alcohol or use illicit drugs. family history includes Colon polyps in her sister and Stomach cancer in her sister.  There is no history of Colon cancer. Allergies  Allergen Reactions  . Sulfa Antibiotics Rash        Review of Systems: Negative for abdominal pain or rectal bleeding The remainder of the 10 point ROS is negative except as outlined in H&P   Physical Exam: General appearance  Well developed, in no distress. Eyes- non icteric. HEENT nontraumatic, normocephalic. Mouth no lesions, tongue papillated, no cheilosis. Neck supple without adenopathy, thyroid not enlarged, no  carotid bruits, no JVD. Lungs Clear to auscultation bilaterally. Cor normal S1, normal S2, regular rhythm, no murmur,  quiet precordium. Abdomen: Soft nontender with normoactive bowel sounds. Rectal: First-degree external hemorrhoids. Normal anal canal. No prolapse of rectal tissue. No evidence of thrombosis. There is no pain or irritation on digital exam. Stool is Hemoccult negative. Extremities no pedal edema. Skin no lesions. Neurological alert and oriented x 3. Psychological normal mood and affect.  Assessment and Plan:  Problem #1 Symptomatic internal and external hemorrhoids treated medically with good response. Patient is interested in continuing conservative  rectal care and medicated suppositories. She does not to proceed  with band ligation or hemorrhoidectomy. She is satisfied with using Anusol-HC suppositories. We will refill her prescription. She will use sitz bath as necessary and stay on a high-fiber diet. Her recall colonoscopy would be in 10 years at the age of 33. She may not need a recall colonoscopy due to her age.   05/08/2012 Lina Sar

## 2012-06-29 ENCOUNTER — Ambulatory Visit: Payer: Medicare Other | Admitting: Internal Medicine

## 2012-07-26 ENCOUNTER — Other Ambulatory Visit: Payer: Self-pay | Admitting: Family Medicine

## 2012-07-26 DIAGNOSIS — Z1231 Encounter for screening mammogram for malignant neoplasm of breast: Secondary | ICD-10-CM

## 2012-08-02 ENCOUNTER — Ambulatory Visit
Admission: RE | Admit: 2012-08-02 | Discharge: 2012-08-02 | Disposition: A | Discharge status: Home / Self Care | Payer: Medicare Other | Source: Ambulatory Visit | Attending: Family Medicine | Admitting: Family Medicine

## 2012-08-02 DIAGNOSIS — Z1231 Encounter for screening mammogram for malignant neoplasm of breast: Secondary | ICD-10-CM

## 2013-08-28 ENCOUNTER — Other Ambulatory Visit: Payer: Self-pay

## 2013-08-28 DIAGNOSIS — Z1231 Encounter for screening mammogram for malignant neoplasm of breast: Secondary | ICD-10-CM

## 2013-09-19 ENCOUNTER — Ambulatory Visit: Payer: Medicare Other

## 2013-10-07 ENCOUNTER — Ambulatory Visit
Admission: RE | Admit: 2013-10-07 | Discharge: 2013-10-07 | Disposition: A | Discharge status: Home / Self Care | Payer: Self-pay | Source: Ambulatory Visit

## 2013-10-07 DIAGNOSIS — Z1231 Encounter for screening mammogram for malignant neoplasm of breast: Secondary | ICD-10-CM

## 2013-10-22 ENCOUNTER — Telehealth: Payer: Self-pay | Admitting: Internal Medicine

## 2013-10-22 NOTE — Telephone Encounter (Signed)
Spoke with patient and she states for the last few weeks, she has been having lower abdominal pain. States "it is the bottom of my stomach." Reports to pain occurs after she eats and then she has loose, urgent stools. Reports she did not make it to the bathroom on several occassions. Scheduled with Nicoletta Ba, PA on 10/23/13 at 3:00 PM.

## 2013-10-23 ENCOUNTER — Encounter: Payer: Self-pay | Admitting: Physician Assistant

## 2013-10-23 ENCOUNTER — Ambulatory Visit (INDEPENDENT_AMBULATORY_CARE_PROVIDER_SITE_OTHER): Payer: Medicare Other | Admitting: Physician Assistant

## 2013-10-23 ENCOUNTER — Other Ambulatory Visit (INDEPENDENT_AMBULATORY_CARE_PROVIDER_SITE_OTHER): Payer: Medicare Other

## 2013-10-23 VITALS — BP 142/88 | HR 68 | Ht 65.0 in | Wt 175.0 lb

## 2013-10-23 DIAGNOSIS — I10 Essential (primary) hypertension: Secondary | ICD-10-CM

## 2013-10-23 DIAGNOSIS — R109 Unspecified abdominal pain: Secondary | ICD-10-CM

## 2013-10-23 DIAGNOSIS — E119 Type 2 diabetes mellitus without complications: Secondary | ICD-10-CM

## 2013-10-23 DIAGNOSIS — R194 Change in bowel habit: Secondary | ICD-10-CM

## 2013-10-23 DIAGNOSIS — R198 Other specified symptoms and signs involving the digestive system and abdomen: Secondary | ICD-10-CM

## 2013-10-23 LAB — CBC WITH DIFFERENTIAL/PLATELET
Basophils Absolute: 0 10*3/uL (ref 0.0–0.1)
Basophils Relative: 0.3 % (ref 0.0–3.0)
Eosinophils Absolute: 0.3 10*3/uL (ref 0.0–0.7)
Eosinophils Relative: 5.6 % — ABNORMAL HIGH (ref 0.0–5.0)
HCT: 39.3 % (ref 36.0–46.0)
Hemoglobin: 13.2 g/dL (ref 12.0–15.0)
Lymphocytes Relative: 32 % (ref 12.0–46.0)
Lymphs Abs: 1.8 10*3/uL (ref 0.7–4.0)
MCHC: 33.5 g/dL (ref 30.0–36.0)
MCV: 89.6 fl (ref 78.0–100.0)
Monocytes Absolute: 0.6 10*3/uL (ref 0.1–1.0)
Monocytes Relative: 10 % (ref 3.0–12.0)
Neutro Abs: 2.9 10*3/uL (ref 1.4–7.7)
Neutrophils Relative %: 52.1 % (ref 43.0–77.0)
Platelets: 259 10*3/uL (ref 150.0–400.0)
RBC: 4.39 Mil/uL (ref 3.87–5.11)
RDW: 13.8 % (ref 11.5–14.6)
WBC: 5.5 10*3/uL (ref 4.5–10.5)

## 2013-10-23 LAB — BASIC METABOLIC PANEL
BUN: 18 mg/dL (ref 6–23)
CO2: 29 mEq/L (ref 19–32)
Calcium: 10.7 mg/dL — ABNORMAL HIGH (ref 8.4–10.5)
Chloride: 98 mEq/L (ref 96–112)
Creatinine, Ser: 1 mg/dL (ref 0.4–1.2)
GFR: 57.67 mL/min — ABNORMAL LOW (ref >60.00)
Glucose, Bld: 95 mg/dL (ref 70–99)
Potassium: 4.4 mEq/L (ref 3.5–5.1)
Sodium: 134 mEq/L — ABNORMAL LOW (ref 135–145)

## 2013-10-23 MED ORDER — LIDOCAINE-HYDROCORTISONE ACE 3-0.5 % EX CREA: 1.0000 "application " | TOPICAL_CREAM | Freq: Three times a day (TID) | CUTANEOUS | Status: DC

## 2013-10-23 NOTE — Progress Notes (Signed)
Subjective:    Patient ID: Carolyn [name], female    DOB: 1941/03/21, 73 y.o.   MRN: [id number]  HPI  Carolyn [name] is a pleasant 73 year old white female known to Dr. Delfin Edis. She last had colonoscopy in June of 2013 which was a normal exam with the exception of internal hemorrhoids. Patient has history of hypertension and adult onset diabetes mellitus otherwise has been fairly healthy. She comes in today with new complaints of lower abdominal pain which has been present over the past month. She says she has noticed the discomfort increases postprandially and she has had associated bloating. She says she has always had problems with constipation and over the past month she has now had difficulty with urgency and loose stools. Generally she just having one to 2 bowel movements per day no melena or hematochezia. No fever or chills. Appetite has been okay weight has been stable is no nausea or vomiting. She also mentions that her hemorrhoids are irritated and she has had itching and burning. She says she feels "raw".  On further history she did take 2 courses of antibiotics in February and also a course of an antiviral for shingles.    Review of Systems  Constitutional: Negative.   HENT: Negative.   Eyes: Negative.   Respiratory: Negative.   Cardiovascular: Negative.   Gastrointestinal: Positive for abdominal pain and diarrhea.  Endocrine: Negative.   Genitourinary: Negative.   Musculoskeletal: Negative.   Allergic/Immunologic: Negative.   Neurological: Negative.   Hematological: Negative.   Psychiatric/Behavioral: Negative.    Outpatient Prescriptions Prior to Visit  Medication Sig Dispense Refill  . CELEBREX 200 MG capsule Take 200 mg by mouth daily.       . citalopram (CELEXA) 20 MG tablet Take 20 mg by mouth daily.       Marland Kitchen losartan-hydrochlorothiazide (HYZAAR) 100-12.5 MG per tablet Take 1 tablet by mouth daily.       . metFORMIN (GLUCOPHAGE-XR) 500 MG 24 hr tablet Take 500 mg by mouth  3 (three) times daily.       Marland Kitchen omeprazole (PRILOSEC) 20 MG capsule Take 20 mg by mouth daily.       Angelia Mould TEST test strip       . GLUCOSAMINE HCL PO Take by mouth 2 (two) times daily.      . hydrocortisone (ANUSOL-HC) 25 MG suppository Insert one rectally every night  12 suppository  4   No facility-administered medications prior to visit.     Allergies  Allergen Reactions  . Sulfa Antibiotics Rash       Patient Active Problem List   Diagnosis Date Noted  . Diabetes 10/23/2013  . HTN (hypertension) 10/23/2013   History  Substance Use Topics  . Smoking status: Never Smoker   . Smokeless tobacco: Never Used  . Alcohol Use: No   family history includes Colon polyps in her sister; Stomach cancer in her sister. There is no history of Colon cancer.  Objective:   Physical Exam well-developed older white female in no acute distress, pleasant blood pressure 142/88 pulse 68 height 5 foot 5 weight 175. HEENT; nontraumatic normocephalic EOMI PERRLA sclera anicteric, Supple; no JVD, Cardiovascular ;regular rate and rhythm with S1-S2 no murmur or gallop, Pulmonary; clear bilaterally, Abdomen ;soft nondistended bowel sounds are active she is tender in the suprapubic area and mildly bilaterally in the lower quadrants there is no guarding or rebound no palpable mass or hepatosplenomegaly, Rectal; exam she has large external hemorrhoids which  aren't not edematous she does have some eructation of the perianal skin. Digital exam negative stool Hemoccult negative Extremities ;no clubbing cyanosis or edema skin warm and dry, Psych; mood and affect appropriate        Assessment & Plan:  #41  73 year old white female with one month history of mid lower abdominal  discomfort exacerbated by po   intake and change in bowel habits with urgency and loose stools. Patient did have 2 recent courses of antibiotics and will rule out C. difficile, rule out postinfectious IBS, rule out occult inflammatory  process #2 status post hysterectomy and bilateral move rectum he #3 adult-onset diabetes mellitus #4 GERD #5 hypertension  Plan; start a daily probiotic, patient was given samples of restora   today Start Robinul Forte 2 mg twice daily as needed for cramping and urgency Check stool for C. difficile by PCR CBC and BMET Schedule for CT scan of the abdomen and pelvis with contrast Further plans pending results of above

## 2013-10-23 NOTE — Patient Instructions (Addendum)
Please go to the basement level to have your labs drawn and a stool study.  We have given you samples of Restora probioitc.  You can get this over the counter. Take for 1 month.   Also get Balmex cream for external skin irritation.  We sent a prescription to [facility] in [place] , [place] for Licocaine cream. Use as directed,  Rectal area 3-4 times daily.    You have been scheduled for a CT scan of the abdomen and pelvis at [place] ([address]---this is in the same building as Press photographer).   You are scheduled on 10-28-2013 at 2:30 PM . You should arrive at 2:15 PM  prior to your appointment time for registration. Please follow the written instructions below on the day of your exam:  WARNING: IF YOU ARE ALLERGIC TO IODINE/X-RAY DYE, PLEASE NOTIFY RADIOLOGY IMMEDIATELY AT [phone]! YOU WILL BE GIVEN A 13 HOUR PREMEDICATION PREP.  1) Do not eat or drink anything after 10:30 am (4 hours prior to your test) 2) You have been given 2 bottles of oral contrast to drink. The solution may taste better if refrigerated, but do NOT add ice or any other liquid to this solution. Shake well before drinking.    Drink 1 bottle of contrast @ 12:30 PM  (2 hours prior to your exam)  Drink 1 bottle of contrast @ 1:30 PM  (1 hour prior to your exam)  You may take any medications as prescribed with a small amount of water except for the following: Metformin, Glucophage, Glucovance, Avandamet, Riomet, Fortamet, Actoplus Met, Janumet, Glumetza or Metaglip. The above medications must be held the day of the exam AND 48 hours after the exam.  The purpose of you drinking the oral contrast is to aid in the visualization of your intestinal tract. The contrast solution may cause some diarrhea. Before your exam is started, you will be given a small amount of fluid to drink. Depending on your individual set of symptoms, you may also receive an intravenous injection of x-ray contrast/dye. Plan  on being at [facility] for 30 minutes or long, depending on the type of exam you are having performed.  If you have any questions regarding your exam or if you need to reschedule, you may call the CT department at [phone] between the hours of 8:00 am and 5:00 pm, Monday-Friday.  ________________________________________________________________________

## 2013-10-23 NOTE — Progress Notes (Signed)
Reviewed and agree.

## 2013-10-25 ENCOUNTER — Other Ambulatory Visit: Payer: Medicare Other

## 2013-10-25 DIAGNOSIS — R109 Unspecified abdominal pain: Secondary | ICD-10-CM

## 2013-10-25 DIAGNOSIS — R194 Change in bowel habit: Secondary | ICD-10-CM

## 2013-10-28 ENCOUNTER — Ambulatory Visit (INDEPENDENT_AMBULATORY_CARE_PROVIDER_SITE_OTHER)
Admission: RE | Admit: 2013-10-28 | Discharge: 2013-10-28 | Disposition: A | Discharge status: Home / Self Care | Payer: Medicare Other | Source: Ambulatory Visit | Attending: Physician Assistant | Admitting: Physician Assistant

## 2013-10-28 DIAGNOSIS — R198 Other specified symptoms and signs involving the digestive system and abdomen: Secondary | ICD-10-CM

## 2013-10-28 DIAGNOSIS — R194 Change in bowel habit: Secondary | ICD-10-CM

## 2013-10-28 DIAGNOSIS — R109 Unspecified abdominal pain: Secondary | ICD-10-CM

## 2013-10-28 LAB — CLOSTRIDIUM DIFFICILE BY PCR: Toxigenic C. Difficile by PCR: NOT DETECTED

## 2013-10-28 MED ORDER — IOHEXOL 300 MG/ML  SOLN
100.0000 mL | Freq: Once | INTRAMUSCULAR | Status: AC | PRN
  Administered 2013-10-28: 100 mL via INTRAVENOUS

## 2014-09-10 ENCOUNTER — Other Ambulatory Visit: Payer: Self-pay

## 2014-09-10 DIAGNOSIS — Z1231 Encounter for screening mammogram for malignant neoplasm of breast: Secondary | ICD-10-CM

## 2014-09-25 DIAGNOSIS — I10 Essential (primary) hypertension: Secondary | ICD-10-CM | POA: Diagnosis not present

## 2014-09-25 DIAGNOSIS — E782 Mixed hyperlipidemia: Secondary | ICD-10-CM | POA: Diagnosis not present

## 2014-09-25 DIAGNOSIS — E1129 Type 2 diabetes mellitus with other diabetic kidney complication: Secondary | ICD-10-CM | POA: Diagnosis not present

## 2014-09-25 DIAGNOSIS — N183 Chronic kidney disease, stage 3 (moderate): Secondary | ICD-10-CM | POA: Diagnosis not present

## 2014-10-03 DIAGNOSIS — Z0001 Encounter for general adult medical examination with abnormal findings: Secondary | ICD-10-CM | POA: Diagnosis not present

## 2014-10-03 DIAGNOSIS — N183 Chronic kidney disease, stage 3 (moderate): Secondary | ICD-10-CM | POA: Diagnosis not present

## 2014-10-03 DIAGNOSIS — Z1389 Encounter for screening for other disorder: Secondary | ICD-10-CM | POA: Diagnosis not present

## 2014-10-09 ENCOUNTER — Ambulatory Visit
Admission: RE | Admit: 2014-10-09 | Discharge: 2014-10-09 | Disposition: A | Discharge status: Home / Self Care | Payer: Medicare Other | Source: Ambulatory Visit

## 2014-10-09 DIAGNOSIS — Z1231 Encounter for screening mammogram for malignant neoplasm of breast: Secondary | ICD-10-CM | POA: Diagnosis not present

## 2014-10-16 DIAGNOSIS — H00029 Hordeolum internum unspecified eye, unspecified eyelid: Secondary | ICD-10-CM | POA: Diagnosis not present

## 2014-10-16 DIAGNOSIS — E119 Type 2 diabetes mellitus without complications: Secondary | ICD-10-CM | POA: Diagnosis not present

## 2014-10-16 DIAGNOSIS — H2513 Age-related nuclear cataract, bilateral: Secondary | ICD-10-CM | POA: Diagnosis not present

## 2014-10-16 DIAGNOSIS — H5203 Hypermetropia, bilateral: Secondary | ICD-10-CM | POA: Diagnosis not present

## 2014-10-20 DIAGNOSIS — M1711 Unilateral primary osteoarthritis, right knee: Secondary | ICD-10-CM | POA: Diagnosis not present

## 2014-10-20 DIAGNOSIS — J301 Allergic rhinitis due to pollen: Secondary | ICD-10-CM | POA: Diagnosis not present

## 2014-11-12 DIAGNOSIS — R05 Cough: Secondary | ICD-10-CM | POA: Diagnosis not present

## 2014-12-17 DIAGNOSIS — M1712 Unilateral primary osteoarthritis, left knee: Secondary | ICD-10-CM | POA: Diagnosis not present

## 2014-12-17 DIAGNOSIS — M1711 Unilateral primary osteoarthritis, right knee: Secondary | ICD-10-CM | POA: Diagnosis not present

## 2014-12-23 ENCOUNTER — Encounter (HOSPITAL_COMMUNITY): Payer: Self-pay

## 2014-12-23 ENCOUNTER — Emergency Department (HOSPITAL_COMMUNITY): Payer: Medicare Other

## 2014-12-23 ENCOUNTER — Observation Stay (HOSPITAL_COMMUNITY)
Admission: EM | Admit: 2014-12-23 | Discharge: 2014-12-24 | Disposition: A | Discharge status: Home / Self Care | Payer: Medicare Other | Attending: Internal Medicine | Admitting: Internal Medicine

## 2014-12-23 DIAGNOSIS — E119 Type 2 diabetes mellitus without complications: Secondary | ICD-10-CM | POA: Diagnosis not present

## 2014-12-23 DIAGNOSIS — R079 Chest pain, unspecified: Secondary | ICD-10-CM | POA: Diagnosis not present

## 2014-12-23 DIAGNOSIS — R0789 Other chest pain: Secondary | ICD-10-CM | POA: Diagnosis not present

## 2014-12-23 DIAGNOSIS — K219 Gastro-esophageal reflux disease without esophagitis: Secondary | ICD-10-CM | POA: Diagnosis not present

## 2014-12-23 DIAGNOSIS — Z86018 Personal history of other benign neoplasm: Secondary | ICD-10-CM | POA: Diagnosis not present

## 2014-12-23 DIAGNOSIS — R072 Precordial pain: Principal | ICD-10-CM | POA: Diagnosis present

## 2014-12-23 DIAGNOSIS — K648 Other hemorrhoids: Secondary | ICD-10-CM | POA: Insufficient documentation

## 2014-12-23 DIAGNOSIS — R05 Cough: Secondary | ICD-10-CM | POA: Diagnosis not present

## 2014-12-23 DIAGNOSIS — F329 Major depressive disorder, single episode, unspecified: Secondary | ICD-10-CM | POA: Insufficient documentation

## 2014-12-23 DIAGNOSIS — I10 Essential (primary) hypertension: Secondary | ICD-10-CM | POA: Diagnosis not present

## 2014-12-23 DIAGNOSIS — M1990 Unspecified osteoarthritis, unspecified site: Secondary | ICD-10-CM | POA: Insufficient documentation

## 2014-12-23 DIAGNOSIS — E785 Hyperlipidemia, unspecified: Secondary | ICD-10-CM | POA: Diagnosis not present

## 2014-12-23 DIAGNOSIS — K589 Irritable bowel syndrome without diarrhea: Secondary | ICD-10-CM | POA: Insufficient documentation

## 2014-12-23 DIAGNOSIS — K760 Fatty (change of) liver, not elsewhere classified: Secondary | ICD-10-CM | POA: Insufficient documentation

## 2014-12-23 DIAGNOSIS — Z79899 Other long term (current) drug therapy: Secondary | ICD-10-CM | POA: Insufficient documentation

## 2014-12-23 DIAGNOSIS — F419 Anxiety disorder, unspecified: Secondary | ICD-10-CM | POA: Diagnosis not present

## 2014-12-23 LAB — BASIC METABOLIC PANEL
Anion gap: 8 (ref 5–15)
BUN: 13 mg/dL (ref 6–20)
CO2: 29 mmol/L (ref 22–32)
Calcium: 10.4 mg/dL — ABNORMAL HIGH (ref 8.9–10.3)
Chloride: 100 mmol/L — ABNORMAL LOW (ref 101–111)
Creatinine, Ser: 0.92 mg/dL (ref 0.44–1.00)
GFR calc Af Amer: >60 mL/min (ref >60)
GFR calc non Af Amer: 60 mL/min — ABNORMAL LOW (ref >60)
Glucose, Bld: 118 mg/dL — ABNORMAL HIGH (ref 65–99)
Potassium: 4.3 mmol/L (ref 3.5–5.1)
Sodium: 137 mmol/L (ref 135–145)

## 2014-12-23 LAB — CBC
HCT: 38.9 % (ref 36.0–46.0)
Hemoglobin: 12.8 g/dL (ref 12.0–15.0)
MCH: 29 pg (ref 26.0–34.0)
MCHC: 32.9 g/dL (ref 30.0–36.0)
MCV: 88.2 fL (ref 78.0–100.0)
Platelets: 261 10*3/uL (ref 150–400)
RBC: 4.41 MIL/uL (ref 3.87–5.11)
RDW: 12.6 % (ref 11.5–15.5)
WBC: 6.8 10*3/uL (ref 4.0–10.5)

## 2014-12-23 LAB — TROPONIN I
Troponin I: <0.03 ng/mL (ref <0.031)
Troponin I: <0.03 ng/mL (ref <0.031)

## 2014-12-23 LAB — HEPATIC FUNCTION PANEL
ALT: 22 U/L (ref 14–54)
AST: 24 U/L (ref 15–41)
Albumin: 4 g/dL (ref 3.5–5.0)
Alkaline Phosphatase: 68 U/L (ref 38–126)
Bilirubin, Direct: 0.2 mg/dL (ref 0.1–0.5)
Indirect Bilirubin: 0.4 mg/dL (ref 0.3–0.9)
Total Bilirubin: 0.6 mg/dL (ref 0.3–1.2)
Total Protein: 6.7 g/dL (ref 6.5–8.1)

## 2014-12-23 LAB — I-STAT TROPONIN, ED: Troponin i, poc: 0 ng/mL (ref 0.00–0.08)

## 2014-12-23 MED ORDER — ONDANSETRON HCL 4 MG/2ML IJ SOLN: 4.0000 mg | Freq: Four times a day (QID) | INTRAMUSCULAR | Status: DC | PRN

## 2014-12-23 MED ORDER — HYDROCHLOROTHIAZIDE 12.5 MG PO CAPS
12.5000 mg | ORAL_CAPSULE | Freq: Every day | ORAL | Status: DC
  Administered 2014-12-24: 12.5 mg via ORAL
  Filled 2014-12-23: qty 1

## 2014-12-23 MED ORDER — ENOXAPARIN SODIUM 40 MG/0.4ML ~~LOC~~ SOLN
40.0000 mg | SUBCUTANEOUS | Status: DC
  Administered 2014-12-23: 40 mg via SUBCUTANEOUS
  Filled 2014-12-23: qty 0.4

## 2014-12-23 MED ORDER — ONDANSETRON HCL 4 MG PO TABS: 4.0000 mg | ORAL_TABLET | Freq: Four times a day (QID) | ORAL | Status: DC | PRN

## 2014-12-23 MED ORDER — CITALOPRAM HYDROBROMIDE 20 MG PO TABS
20.0000 mg | ORAL_TABLET | Freq: Every day | ORAL | Status: DC
  Administered 2014-12-24: 20 mg via ORAL
  Filled 2014-12-23: qty 1

## 2014-12-23 MED ORDER — PANTOPRAZOLE SODIUM 40 MG PO TBEC
40.0000 mg | DELAYED_RELEASE_TABLET | Freq: Every day | ORAL | Status: DC
  Administered 2014-12-24: 40 mg via ORAL
  Filled 2014-12-23: qty 1

## 2014-12-23 MED ORDER — ASPIRIN EC 325 MG PO TBEC
325.0000 mg | DELAYED_RELEASE_TABLET | Freq: Every day | ORAL | Status: DC
  Administered 2014-12-24: 325 mg via ORAL
  Filled 2014-12-23: qty 1

## 2014-12-23 MED ORDER — SODIUM CHLORIDE 0.9 % IJ SOLN
3.0000 mL | Freq: Two times a day (BID) | INTRAMUSCULAR | Status: DC
  Administered 2014-12-24: 3 mL via INTRAVENOUS

## 2014-12-23 MED ORDER — ACETAMINOPHEN 650 MG RE SUPP: 650.0000 mg | Freq: Four times a day (QID) | RECTAL | Status: DC | PRN

## 2014-12-23 MED ORDER — LOSARTAN POTASSIUM 50 MG PO TABS
100.0000 mg | ORAL_TABLET | Freq: Every day | ORAL | Status: DC
  Administered 2014-12-24: 100 mg via ORAL
  Filled 2014-12-23: qty 2

## 2014-12-23 MED ORDER — ALPRAZOLAM 0.25 MG PO TABS
0.2500 mg | ORAL_TABLET | Freq: Every evening | ORAL | Status: DC | PRN
  Administered 2014-12-23: 0.25 mg via ORAL
  Filled 2014-12-23: qty 1

## 2014-12-23 MED ORDER — NITROGLYCERIN 0.4 MG SL SUBL: 0.4000 mg | SUBLINGUAL_TABLET | SUBLINGUAL | Status: DC | PRN

## 2014-12-23 MED ORDER — LOSARTAN POTASSIUM-HCTZ 100-12.5 MG PO TABS: 1.0000 | ORAL_TABLET | Freq: Every day | ORAL | Status: DC

## 2014-12-23 MED ORDER — ACETAMINOPHEN 325 MG PO TABS: 650.0000 mg | ORAL_TABLET | Freq: Four times a day (QID) | ORAL | Status: DC | PRN

## 2014-12-23 NOTE — ED Provider Notes (Signed)
CSN: [id number]     Arrival date & time 12/23/14  1032 History   First MD Initiated Contact with Patient 12/23/14 1042     Chief Complaint  Patient presents with  . Chest Pain     (Consider location/radiation/quality/duration/timing/severity/associated sxs/prior Treatment) Patient is a 74 y.o. female presenting with chest pain. The history is provided by the patient.  Chest Pain Pain location:  Substernal area and L chest Radiates to: right chest. Pain radiates to the back: no   Pain severity:  Moderate Onset quality:  Gradual Duration:  3 hours Timing:  Constant Progression:  Improving Chronicity:  New Context: at rest   Relieved by:  None tried Worsened by:  Exertion Ineffective treatments:  None tried Associated symptoms: no abdominal pain, no back pain, no claudication, no cough, no diaphoresis, no dizziness, no fatigue, no fever, no headache, no lower extremity edema, no nausea, no near-syncope, no numbness, no palpitations, no shortness of breath, not vomiting and no weakness   Risk factors: diabetes mellitus, high cholesterol and hypertension     Past Medical History  Diagnosis Date  . Diabetes mellitus   . Hypertension   . Arthritis   . Anxiety   . Depression   . GERD (gastroesophageal reflux disease)   . Hyperlipidemia   . Hepatic steatosis   . Internal hemorrhoid   . Tubular adenoma of colon 1999    colonoscopy  . IBS (irritable bowel syndrome)    Past Surgical History  Procedure Laterality Date  . Back surgery  04/2011  . Vaginal hysterectomy  1964  . Ankle fracture surgery Left    Family History  Problem Relation Age of Onset  . Stomach cancer Sister   . Colon cancer Neg Hx   . Colon polyps Sister     hyperplastic   History  Substance Use Topics  . Smoking status: Never Smoker   . Smokeless tobacco: Never Used  . Alcohol Use: No   OB History    No data available     Review of Systems  Constitutional: Negative for fever, chills,  diaphoresis, appetite change and fatigue.  Eyes: Negative for visual disturbance.  Respiratory: Negative for cough, chest tightness, shortness of breath and wheezing.   Cardiovascular: Positive for chest pain. Negative for palpitations, claudication, leg swelling and near-syncope.  Gastrointestinal: Negative for nausea, vomiting, abdominal pain, diarrhea and abdominal distention.  Musculoskeletal: Negative for back pain, neck pain and neck stiffness.  Skin: Negative for color change, pallor and rash.  Neurological: Positive for light-headedness. Negative for dizziness, syncope, weakness, numbness and headaches.  All other systems reviewed and are negative.     Allergies  Sulfa antibiotics  Home Medications   Prior to Admission medications   Medication Sig Start Date End Date Taking? Authorizing Provider  CELEBREX 200 MG capsule Take 200 mg by mouth daily.  01/05/12  Yes Historical Provider, MD  citalopram (CELEXA) 20 MG tablet Take 20 mg by mouth daily.  01/05/12  Yes Historical Provider, MD  KRILL OIL PO Take 1 tablet by mouth daily.   Yes Historical Provider, MD  Lidocaine-Hydrocortisone Ace 3-0.5 % CREA Apply 1 application topically 3 (three) times daily. Patient not taking: Reported on 12/23/2014 10/23/13   Amy S Esterwood, PA-C  losartan-hydrochlorothiazide (HYZAAR) 100-12.5 MG per tablet Take 1 tablet by mouth daily.  12/16/11  Yes Historical Provider, MD  metFORMIN (GLUCOPHAGE-XR) 500 MG 24 hr tablet Take 500-1,000 mg by mouth 2 (two) times daily. Take 2 tablets in the  morning, and 1 tablet in the evening. 11/23/11  Yes Historical Provider, MD  Misc Natural Products (TART CHERRY ADVANCED PO) Take 1 tablet by mouth daily.   Yes Historical Provider, MD  Nutritional Supplements (RA MELATONIN/B-6 PO) Take 1 mg by mouth at bedtime and may repeat dose one time if needed.   Yes Historical Provider, MD  omeprazole (PRILOSEC) 20 MG capsule Take 20 mg by mouth daily.  11/12/11  Yes Historical  Provider, MD  TRUETRACK TEST test strip  01/10/12   Historical Provider, MD   BP 139/91 mmHg  Pulse 56  Temp(Src) 97.8 F (36.6 C) (Oral)  Resp 14  Ht 5\' 7"  (1.702 m)  Wt 177 lb 4.8 oz (80.423 kg)  BMI 27.76 kg/m2  SpO2 98% Physical Exam  Constitutional: She is oriented to person, place, and time. She appears well-developed and well-nourished. No distress.  HENT:  Head: Normocephalic and atraumatic.  Mouth/Throat: Oropharynx is clear and moist.  Eyes: Conjunctivae and EOM are normal. Pupils are equal, round, and reactive to light.  Neck: Normal range of motion. Neck supple.  Cardiovascular: Normal rate, regular rhythm, normal heart sounds and intact distal pulses.  Exam reveals no gallop and no friction rub.   No murmur heard. Pulmonary/Chest: Effort normal and breath sounds normal. No respiratory distress. She has no wheezes. She has no rales. She exhibits no tenderness.  Abdominal: Soft. Bowel sounds are normal. She exhibits no distension. There is no tenderness. There is no rebound and no guarding.  Musculoskeletal: Normal range of motion. She exhibits no edema or tenderness.  Neurological: She is alert and oriented to person, place, and time. She has normal reflexes. GCS eye subscore is 4. GCS verbal subscore is 5. GCS motor subscore is 6.  Skin: Skin is warm and dry. No rash noted. She is not diaphoretic. No erythema. No pallor.  Nursing note and vitals reviewed.   ED Course  Procedures (including critical care time) Labs Review Labs Reviewed  BASIC METABOLIC PANEL - Abnormal; Notable for the following:    Chloride 100 (*)    Glucose, Bld 118 (*)    Calcium 10.4 (*)    GFR calc non Af Amer 60 (*)    All other components within normal limits  CBC  HEPATIC FUNCTION PANEL  TROPONIN I  TROPONIN I  TROPONIN I  Randolm Idol, ED    Imaging Review Dg Chest 2 View  12/23/2014   CLINICAL DATA:  74 year old female with left side chest pain. Cough for 3 weeks. Initial  encounter.  EXAM: CHEST  2 VIEW  COMPARISON:  04/04/2011.  FINDINGS: Lung volumes are stable and within normal limits. Normal cardiac size and mediastinal contours. Visualized tracheal air column is within normal limits. No pneumothorax, pulmonary edema, pleural effusion or acute pulmonary opacity. No acute osseous abnormality identified.  IMPRESSION: No acute cardiopulmonary abnormality.   Electronically Signed   By: Genevie Ann M.D.   On: 12/23/2014 11:18     EKG Interpretation   Date/Time:  Tuesday Dec 23 2014 10:38:11 EDT Ventricular Rate:  66 PR Interval:  177 QRS Duration: 102 QT Interval:  388 QTC Calculation: 406 R Axis:   46 Text Interpretation:  Sinus rhythm No significant change since last  tracing Confirmed by KNAPP  MD-J, JON ([id number]) on 12/23/2014 10:42:57 AM      MDM   Final diagnoses:  Precordial chest pain    74 yo F with PMH of HTN, DM, HPL, presenting from [facility] urgent care  with chest pain.  Onset this morning while at rest.  Pt reports substernal and L-sided chest pain, sharp in nature, radiating to right side.  +associated tightness in throat, lightheadedness.  No SOB, N/V, diaphoresis.  Went to urgent care, given ASA 324 mg and sent to ED for eval.  Pt reports pain now significantly improved.    On presentation, pt AFVSS, alert, in NAD.  CV and lung exam WNL.  Abdomen soft, non-tender.  No LE edema or s/sx of DVT.  No other acute findings. EKG as above, NSR with no acute ischemic changes.  R/o ACS, HEAR score 4.  Doubt PE with no dyspnea, hypoxia, tachycardia, lack of risk factors.   Lab results and CXR unremarkable.  Pt reports pain now resolved.  Will admit for ACS rule out.  Stable during my care.  Discussed with attending Dr. Tomi Bamberger.   Ellwood Dense, MD 12/23/14 1524

## 2014-12-23 NOTE — H&P (Signed)
History and Physical  Carolyn [name] NOI:[id number] DOB: 1940/11/26 DOA: 12/23/2014   PCP: Gar Ponto, MD  Referring Physician: ED/ Dr. Tomi Bamberger  Chief Complaint: chest pain  HPI:  74 year old female with a history of hypertension, diabetes mellitus, osteoarthritis, and GERD presented with chest discomfort that began at 8 AM on the day of admission. The patient was sitting in her chair at home +1 hour after drinking coffee and eating breakfast when she developed the chest discomfort that was midsternal. She has some transient feeling of weakness but denied any dizziness, nausea, vomiting, diaphoresis, shortness of breath. The patient continued emergency department when her symptoms persisted. She has been otherwise been in her usual state of health. She denies any fevers, chills, nausea, vomiting, diarrhea, abdominal pain, dysuria, hematuria, hematochezia, melena. She denies any headaches or visual disturbance. The patient has never smoked previously. There is no family history of premature coronary artery disease. In the emergency department, the patient is essentially pain-free at this time. In the emergency department, the patient was afebrile and hemodynamically stable. CBC and BMP were unremarkable except for mildly elevated uncorrected calcium of 10.4. Assessment/Plan: Atypical chest pain -EKG without any concerning ischemic changes -Initial point of care troponin negative -Cycle troponins -HEART score 3 -Place on telemetry Hypertension -Stable -Continue lisinopril HCTZ Hypercalcemia -Check hepatic panel to fully correct for calcium -Suspect this is due to the patient's HCTZ Diabetes mellitus type 2 -Hemoglobin A1c -Discontinue metformin -NovoLog sliding scale Depression  -Continue Celexa   GERD -Continue PPI       Past Medical History  Diagnosis Date  . Diabetes mellitus   . Hypertension   . Arthritis   . Anxiety   . Depression   . GERD (gastroesophageal  reflux disease)   . Hyperlipidemia   . Hepatic steatosis   . Internal hemorrhoid   . Tubular adenoma of colon 1999    colonoscopy  . IBS (irritable bowel syndrome)    Past Surgical History  Procedure Laterality Date  . Back surgery  04/2011  . Vaginal hysterectomy  1964   Social History:  reports that she has never smoked. She has never used smokeless tobacco. She reports that she does not drink alcohol or use illicit drugs.   Family History  Problem Relation Age of Onset  . Stomach cancer Sister   . Colon cancer Neg Hx   . Colon polyps Sister     hyperplastic     Allergies  Allergen Reactions  . Sulfa Antibiotics Rash      Prior to Admission medications   Medication Sig Start Date End Date Taking? Authorizing Provider  CELEBREX 200 MG capsule Take 200 mg by mouth daily.  01/05/12  Yes Historical Provider, MD  citalopram (CELEXA) 20 MG tablet Take 20 mg by mouth daily.  01/05/12  Yes Historical Provider, MD  KRILL OIL PO Take 1 tablet by mouth daily.   Yes Historical Provider, MD  Lidocaine-Hydrocortisone Ace 3-0.5 % CREA Apply 1 application topically 3 (three) times daily. Patient not taking: Reported on 12/23/2014 10/23/13   Amy S Esterwood, PA-C  losartan-hydrochlorothiazide (HYZAAR) 100-12.5 MG per tablet Take 1 tablet by mouth daily.  12/16/11  Yes Historical Provider, MD  metFORMIN (GLUCOPHAGE-XR) 500 MG 24 hr tablet Take 500-1,000 mg by mouth 2 (two) times daily. Take 2 tablets in the morning, and 1 tablet in the evening. 11/23/11  Yes Historical Provider, MD  Misc Natural Products (TART CHERRY ADVANCED PO) Take 1 tablet by  mouth daily.   Yes Historical Provider, MD  Nutritional Supplements (RA MELATONIN/B-6 PO) Take 1 mg by mouth at bedtime and may repeat dose one time if needed.   Yes Historical Provider, MD  omeprazole (PRILOSEC) 20 MG capsule Take 20 mg by mouth daily.  11/12/11  Yes Historical Provider, MD  [place] TEST test strip  01/10/12   Historical Provider, MD     Review of Systems:  Constitutional:  No weight loss, night sweats, Fevers, chills, fatigue.  Head&Eyes: No headache.  No vision loss.  No eye pain or scotoma ENT:  No Difficulty swallowing,Tooth/dental problems,Sore throat,  No ear ache, post nasal drip,  Cardio-vascular:  No  Orthopnea, PND, swelling in lower extremities,  dizziness, palpitations  GI:  No  abdominal pain, nausea, vomiting, diarrhea, loss of appetite, hematochezia, melena, heartburn, indigestion, Resp:  No shortness of breath with exertion or at rest. No cough. No coughing up of blood .No wheezing.No chest wall deformity  Skin:  no rash or lesions.  GU:  no dysuria, change in color of urine, no urgency or frequency. No flank pain.  Musculoskeletal:  No joint pain or swelling. No decreased range of motion. No back pain.  Psych:  No change in mood or affect. No depression or anxiety. Neurologic: No headache, no dysesthesia, no focal weakness, no vision loss. No syncope  Physical Exam: Filed Vitals:   12/23/14 1157 12/23/14 1200 12/23/14 1215 12/23/14 1300  BP: 147/81 149/80 143/76 149/79  Pulse: 57 62 58 56  Temp:      TempSrc:      Resp: 15     Height:      Weight:      SpO2: 96% 96% 97% 97%   General:  A&O x 3, NAD, nontoxic, pleasant/cooperative Head/Eye: No conjunctival hemorrhage, no icterus, [place]/AT, No nystagmus ENT:  No icterus,  No thrush, good dentition, no pharyngeal exudate Neck:  No masses, no lymphadenpathy, no bruits CV:  RRR, no rub, no gallop, no S3 Lung:  CTAB, good air movement, no wheeze, no rhonchi Abdomen: soft/NT, +BS, nondistended, no peritoneal signs Ext: No cyanosis, No rashes, No petechiae, No lymphangitis, No edema Neuro: CNII-XII intact, strength 4/5 in bilateral upper and lower extremities, no dysmetria  Labs on Admission:  Basic Metabolic Panel:  Recent Labs Lab 12/23/14 1050  NA 137  K 4.3  CL 100*  CO2 29  GLUCOSE 118*  BUN 13  CREATININE 0.92  CALCIUM  10.4*   Liver Function Tests: No results for input(s): AST, ALT, ALKPHOS, BILITOT, PROT, ALBUMIN in the last 168 hours. No results for input(s): LIPASE, AMYLASE in the last 168 hours. No results for input(s): AMMONIA in the last 168 hours. CBC:  Recent Labs Lab 12/23/14 1050  WBC 6.8  HGB 12.8  HCT 38.9  MCV 88.2  PLT 261   Cardiac Enzymes: No results for input(s): CKTOTAL, CKMB, CKMBINDEX, TROPONINI in the last 168 hours. BNP: Invalid input(s): POCBNP CBG: No results for input(s): GLUCAP in the last 168 hours.  Radiological Exams on Admission: Dg Chest 2 View  12/23/2014   CLINICAL DATA:  74 year old female with left side chest pain. Cough for 3 weeks. Initial encounter.  EXAM: CHEST  2 VIEW  COMPARISON:  04/04/2011.  FINDINGS: Lung volumes are stable and within normal limits. Normal cardiac size and mediastinal contours. Visualized tracheal air column is within normal limits. No pneumothorax, pulmonary edema, pleural effusion or acute pulmonary opacity. No acute osseous abnormality identified.  IMPRESSION: No acute  cardiopulmonary abnormality.   Electronically Signed   By: Genevie Ann M.D.   On: 12/23/2014 11:18    EKG: Independently reviewed. Sinus rhythm without ST-T wave change    Time spent:60 minutes Code Status:   FULL Family Communication:   No Family at bedside   Jinnie Onley, DO  [facility] Pager [phone]  If 7PM-7AM, please contact night-coverage [email or web address] Password [email or web address] 12/23/2014, 1:16 PM

## 2014-12-23 NOTE — ED Provider Notes (Signed)
Pt presented to the ED with left sided chest pain.  Felt a tightness in her throat.  Some radiation to the back.  Took asa and some improvement. Physical Exam  BP 178/94 mmHg  Pulse 65  Temp(Src) 97 F (36.1 C) (Oral)  Resp 13  Ht 5\' 7"  (1.702 m)  Wt 180 lb (81.647 kg)  BMI 28.19 kg/m2  SpO2 97%  Physical Exam  Constitutional: She appears well-developed and well-nourished. No distress.  HENT:  Head: Normocephalic and atraumatic.  Right Ear: External ear normal.  Left Ear: External ear normal.  Eyes: Conjunctivae are normal. Right eye exhibits no discharge. Left eye exhibits no discharge. No scleral icterus.  Neck: Neck supple. No tracheal deviation present.  Cardiovascular: Normal rate.   Pulmonary/Chest: Effort normal. No stridor. No respiratory distress.  Musculoskeletal: She exhibits no edema.  Neurological: She is alert. Cranial nerve deficit: no gross deficits.  Skin: Skin is warm and dry. No rash noted.  Psychiatric: She has a normal mood and affect.  Nursing note and vitals reviewed.   ED Course  Procedures  EKG Interpretation  Date/Time:  Tuesday Dec 23 2014 10:38:11 EDT Ventricular Rate:  66 PR Interval:  177 QRS Duration: 102 QT Interval:  388 QTC Calculation: 406 R Axis:   46 Text Interpretation:  Sinus rhythm No significant change since last tracing Confirmed by Chancellor Vanderloop  MD-J, Roylene Heaton ([id number]) on 12/23/2014 10:42:57 AM       MDM  Pt presents with new chest pain.  Right now pain free.  No STEMI by ECG.  Heart score of 4.  ?ACS.  Will admit for further evaluation and treatment     Dorie Rank, MD 12/23/14 1153

## 2014-12-23 NOTE — ED Notes (Addendum)
Pt started having left sided cp this morning at 0800 and got tightness in her throat. She thought maybe that could've been anxiety but wasn't sure. Felt a little dizzy when it started. The pain has eased up some. Went to [facility] and they gave her 324 mg ASA and then came here.

## 2014-12-24 ENCOUNTER — Other Ambulatory Visit: Payer: Self-pay | Admitting: Physician Assistant

## 2014-12-24 DIAGNOSIS — I10 Essential (primary) hypertension: Secondary | ICD-10-CM | POA: Diagnosis not present

## 2014-12-24 DIAGNOSIS — R0789 Other chest pain: Secondary | ICD-10-CM

## 2014-12-24 DIAGNOSIS — E785 Hyperlipidemia, unspecified: Secondary | ICD-10-CM

## 2014-12-24 DIAGNOSIS — R072 Precordial pain: Secondary | ICD-10-CM | POA: Diagnosis not present

## 2014-12-24 LAB — COMPREHENSIVE METABOLIC PANEL
ALT: 21 U/L (ref 14–54)
AST: 20 U/L (ref 15–41)
Albumin: 3.5 g/dL (ref 3.5–5.0)
Alkaline Phosphatase: 65 U/L (ref 38–126)
Anion gap: 7 (ref 5–15)
BUN: 17 mg/dL (ref 6–20)
CO2: 29 mmol/L (ref 22–32)
Calcium: 10.1 mg/dL (ref 8.9–10.3)
Chloride: 101 mmol/L (ref 101–111)
Creatinine, Ser: 1.01 mg/dL — ABNORMAL HIGH (ref 0.44–1.00)
GFR calc Af Amer: >60 mL/min (ref >60)
GFR calc non Af Amer: 53 mL/min — ABNORMAL LOW (ref >60)
Glucose, Bld: 145 mg/dL — ABNORMAL HIGH (ref 65–99)
Potassium: 3.8 mmol/L (ref 3.5–5.1)
Sodium: 137 mmol/L (ref 135–145)
Total Bilirubin: 0.6 mg/dL (ref 0.3–1.2)
Total Protein: 6.3 g/dL — ABNORMAL LOW (ref 6.5–8.1)

## 2014-12-24 LAB — LIPID PANEL
Cholesterol: 190 mg/dL (ref 0–200)
HDL: 41 mg/dL (ref >40)
LDL Cholesterol: 118 mg/dL — ABNORMAL HIGH (ref 0–99)
Total CHOL/HDL Ratio: 4.6 RATIO
Triglycerides: 157 mg/dL — ABNORMAL HIGH (ref <150)
VLDL: 31 mg/dL (ref 0–40)

## 2014-12-24 LAB — TROPONIN I: Troponin I: <0.03 ng/mL (ref <0.031)

## 2014-12-24 NOTE — Progress Notes (Signed)
Pt discharged via W/C, condition stable, accompanied by family.

## 2014-12-24 NOTE — Discharge Summary (Signed)
Physician Discharge Summary  DARIANNY MOMON SWF:[id number] DOB: 1940-11-19 DOA: 12/23/2014  PCP: Gar Ponto, MD  Admit date: 12/23/2014 Discharge date: 12/24/2014  Time spent: 55 minutes  Recommendations for Outpatient Follow-up:  1. Stress test has been scheduled for the patient 2. Follow lipid panel and consider starting statin as outpatient if improvement is not noted  Discharge Condition: Stable Diet recommendation: Low sodium heart healthy  Discharge Diagnoses:  Active Problems:   HTN (hypertension)   Precordial chest pain   Hypercalcemia  dyslipidemia  History of present illness:  74 year old female with a history of hypertension, diabetes mellitus, osteoarthritis, and GERD presented with chest discomfort that began at 8 AM on the day of admission. The patient was sitting in her chair at home +1 hour after drinking coffee and eating breakfast when she developed the chest discomfort that was midsternal. She has some transient feeling of weakness but denied any dizziness, nausea, vomiting, diaphoresis, shortness of breath.   Hospital Course:  Chest pain - EKG was unrevealing. Cardiac enzymes have been negative. -A cardiology consult was requested and she has been evaluated by Dr. Mare Ferrari this morning-he is recommending an outpatient treadmill stress test which has been arranged for her-  she is in agreement with this plan and will be discharged today  Hypertension - Continue lisinopril/HCTZ  Hypercalcemia -Mildly elevated calcium of 10.4 on admission (our cutoff is 10.3) -rechecked today - it is 10.1  Diabetes mellitus type 2 -Continue metformin  Dyslipidemia -LDL is 118-cholesterol level 190-triglycerides 157 -He is advised to take a low-fat diet-consider starting statin as outpatient if LDL does not improve    Consultations:  Cardiology  Discharge Exam: Filed Weights   12/23/14 1040 12/23/14 1427 12/24/14 0421  Weight: 81.647 kg (180 lb) 80.423 kg (177 lb  4.8 oz) 80.151 kg (176 lb 11.2 oz)   Filed Vitals:   12/24/14 0421  BP: 106/55  Pulse: 67  Temp: 98.1 F (36.7 C)  Resp:     General: AAO x 3, no distress Cardiovascular: RRR, no murmurs  Respiratory: clear to auscultation bilaterally GI: soft, non-tender, non-distended, bowel sound positive  Discharge Instructions You were cared for by a hospitalist during your hospital stay. If you have any questions about your discharge medications or the care you received while you were in the hospital after you are discharged, you can call the unit and asked to speak with the hospitalist on call if the hospitalist that took care of you is not available. Once you are discharged, your primary care physician will handle any further medical issues. Please note that NO REFILLS for any discharge medications will be authorized once you are discharged, as it is imperative that you return to your primary care physician (or establish a relationship with a primary care physician if you do not have one) for your aftercare needs so that they can reassess your need for medications and monitor your lab values.      Discharge Instructions    Diet - low sodium heart healthy    Complete by:  As directed      Increase activity slowly    Complete by:  As directed             Medication List    TAKE these medications        CELEBREX 200 MG capsule  Generic drug:  celecoxib  Take 200 mg by mouth daily.     citalopram 20 MG tablet  Commonly known as:  CELEXA  Take 20 mg by mouth daily.     KRILL OIL PO  Take 1 tablet by mouth daily.     losartan-hydrochlorothiazide 100-12.5 MG per tablet  Commonly known as:  HYZAAR  Take 1 tablet by mouth daily.     metFORMIN 500 MG 24 hr tablet  Commonly known as:  GLUCOPHAGE-XR  Take 500-1,000 mg by mouth 2 (two) times daily. Take 2 tablets in the morning, and 1 tablet in the evening.     omeprazole 20 MG capsule  Commonly known as:  PRILOSEC  Take 20 mg by  mouth daily.     RA MELATONIN/B-6 PO  Take 1 mg by mouth at bedtime and may repeat dose one time if needed.     TART CHERRY ADVANCED PO  Take 1 tablet by mouth daily.     TRUETRACK TEST test strip  Generic drug:  glucose blood       Allergies  Allergen Reactions  . Sulfa Antibiotics Rash   Follow-up Information    Follow up with [facility] On 01/15/2015.   Specialty:  Cardiology   Why:  3:30pm. Nothing by mouth (food or drink) after 9am in the morning of stress test, sips of water with pills ok.   Contact information:   [address] [place] [phone]       The results of significant diagnostics from this hospitalization (including imaging, microbiology, ancillary and laboratory) are listed below for reference.    Significant Diagnostic Studies: Dg Chest 2 View  12/23/2014   CLINICAL DATA:  74 year old female with left side chest pain. Cough for 3 weeks. Initial encounter.  EXAM: CHEST  2 VIEW  COMPARISON:  04/04/2011.  FINDINGS: Lung volumes are stable and within normal limits. Normal cardiac size and mediastinal contours. Visualized tracheal air column is within normal limits. No pneumothorax, pulmonary edema, pleural effusion or acute pulmonary opacity. No acute osseous abnormality identified.  IMPRESSION: No acute cardiopulmonary abnormality.   Electronically Signed   By: Genevie Ann M.D.   On: 12/23/2014 11:18    Microbiology: No results found for this or any previous visit (from the past 240 hour(s)).   Labs: Basic Metabolic Panel:  Recent Labs Lab 12/23/14 1050 12/24/14 0219  NA 137 137  K 4.3 3.8  CL 100* 101  CO2 29 29  GLUCOSE 118* 145*  BUN 13 17  CREATININE 0.92 1.01*  CALCIUM 10.4* 10.1   Liver Function Tests:  Recent Labs Lab 12/23/14 1050 12/24/14 0219  AST 24 20  ALT 22 21  ALKPHOS 68 65  BILITOT 0.6 0.6  PROT 6.7 6.3*  ALBUMIN 4.0 3.5   No results for input(s): LIPASE, AMYLASE in the  last 168 hours. No results for input(s): AMMONIA in the last 168 hours. CBC:  Recent Labs Lab 12/23/14 1050  WBC 6.8  HGB 12.8  HCT 38.9  MCV 88.2  PLT 261   Cardiac Enzymes:  Recent Labs Lab 12/23/14 1605 12/23/14 2006 12/24/14 0219  TROPONINI <0.03 <0.03 <0.03   BNP: BNP (last 3 results) No results for input(s): BNP in the last 8760 hours.  ProBNP (last 3 results) No results for input(s): PROBNP in the last 8760 hours.  CBG: No results for input(s): GLUCAP in the last 168 hours.     SignedDebbe Odea, MD [facility] 12/24/2014, 1:52 PM

## 2014-12-24 NOTE — Consult Note (Signed)
CARDIOLOGY CONSULT NOTE   Patient ID: Carolyn [name] MRN: [id number], DOB/AGE: 05/04/1941   Admit date: 12/23/2014 Date of Consult: 12/24/2014   Primary Physician: Gar Ponto, MD Primary Cardiologist: None  Pt. Profile  74 year old woman admitted with chest discomfort.  Problem List  Past Medical History  Diagnosis Date  . Diabetes mellitus   . Hypertension   . Arthritis   . Anxiety   . Depression   . GERD (gastroesophageal reflux disease)   . Hyperlipidemia   . Hepatic steatosis   . Internal hemorrhoid   . Tubular adenoma of colon 1999    colonoscopy  . IBS (irritable bowel syndrome)     Past Surgical History  Procedure Laterality Date  . Back surgery  04/2011  . Vaginal hysterectomy  1964  . Ankle fracture surgery Left      Allergies  Allergies  Allergen Reactions  . Sulfa Antibiotics Rash    HPI   This 74 year old woman has a history of high blood pressure diabetes and elevated cholesterol.  She also has a history of GERD.  She was admitted with chest discomfort which began yesterday morning after eating breakfast and drinking coffee.  The discomfort was substernal and did not radiate.  There were no associated symptoms of nausea or vomiting.  There was no diaphoresis. Her family history is negative for premature coronary disease. She was pain-free on arrival and has had no recurrent pain. Her troponins are negative 3.  Her electrocardiogram is normal at rest without ischemic changes. She walks regularly for exercise and does not have any history of exertional chest discomfort.  Inpatient Medications  . aspirin EC  325 mg Oral Daily  . citalopram  20 mg Oral Daily  . enoxaparin (LOVENOX) injection  40 mg Subcutaneous Q24H  . losartan  100 mg Oral Daily   And  . hydrochlorothiazide  12.5 mg Oral Daily  . pantoprazole  40 mg Oral Daily  . sodium chloride  3 mL Intravenous Q12H    Family History Family History  Problem Relation Age of Onset  .  Stomach cancer Sister   . Colon cancer Neg Hx   . Colon polyps Sister     hyperplastic     Social History History   Social History  . Marital Status: Married    Spouse Name: N/A  . Number of Children: N/A  . Years of Education: N/A   Occupational History  . Not on file.   Social History Main Topics  . Smoking status: Never Smoker   . Smokeless tobacco: Never Used  . Alcohol Use: No  . Drug Use: No  . Sexual Activity: Not on file   Other Topics Concern  . Not on file   Social History Narrative   Daily caffeine     Review of Systems  General:  No chills, fever, night sweats or weight changes.  Cardiovascular:  No chest pain, dyspnea on exertion, edema, orthopnea, palpitations, paroxysmal nocturnal dyspnea. Dermatological: No rash, lesions/masses Respiratory: No cough, dyspnea Urologic: No hematuria, dysuria Abdominal:   No nausea, vomiting, diarrhea, bright red blood per rectum, melena, or hematemesis Neurologic:  No visual changes, wkns, changes in mental status. All other systems reviewed and are otherwise negative except as noted above.  Physical Exam  Blood pressure 106/55, pulse 67, temperature 98.1 F (36.7 C), temperature source Oral, resp. rate 14, height 5\' 7"  (1.702 m), weight 176 lb 11.2 oz (80.151 kg), SpO2 95 %.  General: Pleasant, NAD Psych:  Normal affect. Neuro: Alert and oriented X 3. Moves all extremities spontaneously. HEENT: Normal  Neck: Supple without bruits or JVD. Lungs:  Resp regular and unlabored, CTA. Heart: RRR no s3, s4, or murmurs. Abdomen: Soft, non-tender, non-distended, BS + x 4.  Extremities: No clubbing, cyanosis or edema. DP/PT/Radials 2+ and equal bilaterally.  Labs   Recent Labs  12/23/14 1605 12/23/14 2006 12/24/14 0219  TROPONINI <0.03 <0.03 <0.03   Lab Results  Component Value Date   WBC 6.8 12/23/2014   HGB 12.8 12/23/2014   HCT 38.9 12/23/2014   MCV 88.2 12/23/2014   PLT 261 12/23/2014     Recent  Labs Lab 12/24/14 0219  NA 137  K 3.8  CL 101  CO2 29  BUN 17  CREATININE 1.01*  CALCIUM 10.1  PROT 6.3*  BILITOT 0.6  ALKPHOS 65  ALT 21  AST 20  GLUCOSE 145*   Lab Results  Component Value Date   CHOL 190 12/24/2014   HDL 41 12/24/2014   LDLCALC 118* 12/24/2014   TRIG 157* 12/24/2014   No results found for: DDIMER  Radiology/Studies  Dg Chest 2 View  12/23/2014   CLINICAL DATA:  74 year old female with left side chest pain. Cough for 3 weeks. Initial encounter.  EXAM: CHEST  2 VIEW  COMPARISON:  04/04/2011.  FINDINGS: Lung volumes are stable and within normal limits. Normal cardiac size and mediastinal contours. Visualized tracheal air column is within normal limits. No pneumothorax, pulmonary edema, pleural effusion or acute pulmonary opacity. No acute osseous abnormality identified.  IMPRESSION: No acute cardiopulmonary abnormality.   Electronically Signed   By: Genevie Ann M.D.   On: 12/23/2014 11:18    ECG  Normal sinus rhythm.  Within normal limits.  Personally reviewed  ASSESSMENT AND PLAN  1.  Chest pain with low suspicion for coronary ischemia.  Suspect possible chest pain secondary to GERD 2.  Multiple risk factors including hypertension and diabetes and hypercholesterolemia  Recommendation: From the cardiac standpoint the patient could be safely discharged home today.  I would recommend that she have a Bruce protocol treadmill stress test which could be done as an outpatient at [facility].   Signed, Darlin Coco MD [facility]  12/24/2014, 7:54 AM

## 2014-12-25 NOTE — Progress Notes (Signed)
UR Completed Salah Burlison Graves-Bigelow, RN,BSN [phone]  

## 2015-01-13 ENCOUNTER — Telehealth (HOSPITAL_COMMUNITY): Payer: Self-pay

## 2015-01-13 NOTE — Telephone Encounter (Signed)
Encounter complete. 

## 2015-01-15 ENCOUNTER — Ambulatory Visit (HOSPITAL_COMMUNITY)
Admission: RE | Admit: 2015-01-15 | Discharge: 2015-01-15 | Disposition: A | Discharge status: Home / Self Care | Payer: Medicare Other | Source: Ambulatory Visit | Attending: Physician Assistant | Admitting: Physician Assistant

## 2015-01-15 ENCOUNTER — Other Ambulatory Visit: Payer: Self-pay | Admitting: *Deleted

## 2015-01-15 DIAGNOSIS — R079 Chest pain, unspecified: Secondary | ICD-10-CM | POA: Diagnosis not present

## 2015-01-15 DIAGNOSIS — R0789 Other chest pain: Secondary | ICD-10-CM

## 2015-01-15 LAB — EXERCISE TOLERANCE TEST
Estimated workload: 6.5 METS
Exercise duration (min): 4 min
Exercise duration (sec): 36 s
MPHR: 146 {beats}/min
Peak HR: 155 {beats}/min
Percent HR: 106 %
RPE: 21352
Rest HR: 60 {beats}/min

## 2015-02-06 DIAGNOSIS — E6609 Other obesity due to excess calories: Secondary | ICD-10-CM | POA: Diagnosis not present

## 2015-02-06 DIAGNOSIS — K219 Gastro-esophageal reflux disease without esophagitis: Secondary | ICD-10-CM | POA: Diagnosis not present

## 2015-02-06 DIAGNOSIS — E782 Mixed hyperlipidemia: Secondary | ICD-10-CM | POA: Diagnosis not present

## 2015-02-06 DIAGNOSIS — E1129 Type 2 diabetes mellitus with other diabetic kidney complication: Secondary | ICD-10-CM | POA: Diagnosis not present

## 2015-02-13 DIAGNOSIS — E6609 Other obesity due to excess calories: Secondary | ICD-10-CM | POA: Diagnosis not present

## 2015-02-13 DIAGNOSIS — K219 Gastro-esophageal reflux disease without esophagitis: Secondary | ICD-10-CM | POA: Diagnosis not present

## 2015-02-13 DIAGNOSIS — E1129 Type 2 diabetes mellitus with other diabetic kidney complication: Secondary | ICD-10-CM | POA: Diagnosis not present

## 2015-02-13 DIAGNOSIS — I10 Essential (primary) hypertension: Secondary | ICD-10-CM | POA: Diagnosis not present

## 2015-02-13 DIAGNOSIS — E782 Mixed hyperlipidemia: Secondary | ICD-10-CM | POA: Diagnosis not present

## 2015-02-16 DIAGNOSIS — T22231A Burn of second degree of right upper arm, initial encounter: Secondary | ICD-10-CM | POA: Diagnosis not present

## 2015-03-20 DIAGNOSIS — J019 Acute sinusitis, unspecified: Secondary | ICD-10-CM | POA: Diagnosis not present

## 2015-06-16 DIAGNOSIS — M545 Low back pain: Secondary | ICD-10-CM | POA: Diagnosis not present

## 2015-06-16 DIAGNOSIS — M1712 Unilateral primary osteoarthritis, left knee: Secondary | ICD-10-CM | POA: Diagnosis not present

## 2015-06-18 ENCOUNTER — Encounter: Payer: Self-pay | Admitting: Internal Medicine

## 2015-07-30 DIAGNOSIS — K219 Gastro-esophageal reflux disease without esophagitis: Secondary | ICD-10-CM | POA: Diagnosis not present

## 2015-07-30 DIAGNOSIS — E782 Mixed hyperlipidemia: Secondary | ICD-10-CM | POA: Diagnosis not present

## 2015-07-30 DIAGNOSIS — E1122 Type 2 diabetes mellitus with diabetic chronic kidney disease: Secondary | ICD-10-CM | POA: Diagnosis not present

## 2015-07-30 DIAGNOSIS — I10 Essential (primary) hypertension: Secondary | ICD-10-CM | POA: Diagnosis not present

## 2015-08-06 DIAGNOSIS — E782 Mixed hyperlipidemia: Secondary | ICD-10-CM | POA: Diagnosis not present

## 2015-08-06 DIAGNOSIS — J301 Allergic rhinitis due to pollen: Secondary | ICD-10-CM | POA: Diagnosis not present

## 2015-08-06 DIAGNOSIS — E1122 Type 2 diabetes mellitus with diabetic chronic kidney disease: Secondary | ICD-10-CM | POA: Diagnosis not present

## 2015-08-06 DIAGNOSIS — E6609 Other obesity due to excess calories: Secondary | ICD-10-CM | POA: Diagnosis not present

## 2015-08-06 DIAGNOSIS — I10 Essential (primary) hypertension: Secondary | ICD-10-CM | POA: Diagnosis not present

## 2015-10-02 DIAGNOSIS — R42 Dizziness and giddiness: Secondary | ICD-10-CM | POA: Diagnosis not present

## 2015-10-07 ENCOUNTER — Other Ambulatory Visit: Payer: Self-pay

## 2015-10-07 DIAGNOSIS — Z1231 Encounter for screening mammogram for malignant neoplasm of breast: Secondary | ICD-10-CM

## 2015-10-15 DIAGNOSIS — I10 Essential (primary) hypertension: Secondary | ICD-10-CM | POA: Diagnosis not present

## 2015-10-15 DIAGNOSIS — K219 Gastro-esophageal reflux disease without esophagitis: Secondary | ICD-10-CM | POA: Diagnosis not present

## 2015-10-15 DIAGNOSIS — E782 Mixed hyperlipidemia: Secondary | ICD-10-CM | POA: Diagnosis not present

## 2015-10-15 DIAGNOSIS — N183 Chronic kidney disease, stage 3 (moderate): Secondary | ICD-10-CM | POA: Diagnosis not present

## 2015-10-15 DIAGNOSIS — E1165 Type 2 diabetes mellitus with hyperglycemia: Secondary | ICD-10-CM | POA: Diagnosis not present

## 2015-10-22 DIAGNOSIS — I10 Essential (primary) hypertension: Secondary | ICD-10-CM | POA: Diagnosis not present

## 2015-10-22 DIAGNOSIS — M1712 Unilateral primary osteoarthritis, left knee: Secondary | ICD-10-CM | POA: Diagnosis not present

## 2015-10-22 DIAGNOSIS — E1122 Type 2 diabetes mellitus with diabetic chronic kidney disease: Secondary | ICD-10-CM | POA: Diagnosis not present

## 2015-10-22 DIAGNOSIS — E782 Mixed hyperlipidemia: Secondary | ICD-10-CM | POA: Diagnosis not present

## 2015-10-22 DIAGNOSIS — Z1212 Encounter for screening for malignant neoplasm of rectum: Secondary | ICD-10-CM | POA: Diagnosis not present

## 2015-10-22 DIAGNOSIS — Z0001 Encounter for general adult medical examination with abnormal findings: Secondary | ICD-10-CM | POA: Diagnosis not present

## 2015-10-23 ENCOUNTER — Ambulatory Visit
Admission: RE | Admit: 2015-10-23 | Discharge: 2015-10-23 | Disposition: A | Discharge status: Home / Self Care | Payer: Medicare Other | Source: Ambulatory Visit

## 2015-10-23 DIAGNOSIS — Z1231 Encounter for screening mammogram for malignant neoplasm of breast: Secondary | ICD-10-CM

## 2015-12-15 DIAGNOSIS — M1712 Unilateral primary osteoarthritis, left knee: Secondary | ICD-10-CM | POA: Diagnosis not present

## 2015-12-15 DIAGNOSIS — F5101 Primary insomnia: Secondary | ICD-10-CM | POA: Diagnosis not present

## 2015-12-15 DIAGNOSIS — M1711 Unilateral primary osteoarthritis, right knee: Secondary | ICD-10-CM | POA: Diagnosis not present

## 2016-02-01 DIAGNOSIS — Z1212 Encounter for screening for malignant neoplasm of rectum: Secondary | ICD-10-CM | POA: Diagnosis not present

## 2016-02-01 DIAGNOSIS — R197 Diarrhea, unspecified: Secondary | ICD-10-CM | POA: Diagnosis not present

## 2016-02-18 DIAGNOSIS — E782 Mixed hyperlipidemia: Secondary | ICD-10-CM | POA: Diagnosis not present

## 2016-02-18 DIAGNOSIS — K219 Gastro-esophageal reflux disease without esophagitis: Secondary | ICD-10-CM | POA: Diagnosis not present

## 2016-02-18 DIAGNOSIS — N183 Chronic kidney disease, stage 3 (moderate): Secondary | ICD-10-CM | POA: Diagnosis not present

## 2016-02-18 DIAGNOSIS — E1165 Type 2 diabetes mellitus with hyperglycemia: Secondary | ICD-10-CM | POA: Diagnosis not present

## 2016-02-18 DIAGNOSIS — I10 Essential (primary) hypertension: Secondary | ICD-10-CM | POA: Diagnosis not present

## 2016-02-23 DIAGNOSIS — K219 Gastro-esophageal reflux disease without esophagitis: Secondary | ICD-10-CM | POA: Diagnosis not present

## 2016-02-23 DIAGNOSIS — I10 Essential (primary) hypertension: Secondary | ICD-10-CM | POA: Diagnosis not present

## 2016-02-23 DIAGNOSIS — E1122 Type 2 diabetes mellitus with diabetic chronic kidney disease: Secondary | ICD-10-CM | POA: Diagnosis not present

## 2016-02-23 DIAGNOSIS — E782 Mixed hyperlipidemia: Secondary | ICD-10-CM | POA: Diagnosis not present

## 2016-03-07 ENCOUNTER — Ambulatory Visit: Payer: Self-pay | Admitting: Gastroenterology

## 2016-03-14 DIAGNOSIS — M170 Bilateral primary osteoarthritis of knee: Secondary | ICD-10-CM | POA: Diagnosis not present

## 2016-04-01 DIAGNOSIS — H43813 Vitreous degeneration, bilateral: Secondary | ICD-10-CM | POA: Diagnosis not present

## 2016-04-01 DIAGNOSIS — H5203 Hypermetropia, bilateral: Secondary | ICD-10-CM | POA: Diagnosis not present

## 2016-04-01 DIAGNOSIS — E119 Type 2 diabetes mellitus without complications: Secondary | ICD-10-CM | POA: Diagnosis not present

## 2016-04-01 DIAGNOSIS — H25813 Combined forms of age-related cataract, bilateral: Secondary | ICD-10-CM | POA: Diagnosis not present

## 2016-04-04 DIAGNOSIS — M170 Bilateral primary osteoarthritis of knee: Secondary | ICD-10-CM | POA: Diagnosis not present

## 2016-04-12 DIAGNOSIS — M170 Bilateral primary osteoarthritis of knee: Secondary | ICD-10-CM | POA: Diagnosis not present

## 2016-04-15 DIAGNOSIS — J209 Acute bronchitis, unspecified: Secondary | ICD-10-CM | POA: Diagnosis not present

## 2016-04-18 DIAGNOSIS — M170 Bilateral primary osteoarthritis of knee: Secondary | ICD-10-CM | POA: Diagnosis not present

## 2016-04-19 ENCOUNTER — Other Ambulatory Visit (HOSPITAL_COMMUNITY): Payer: Self-pay | Admitting: Pharmacist

## 2016-05-03 DIAGNOSIS — R260 Ataxic gait: Secondary | ICD-10-CM | POA: Diagnosis not present

## 2016-05-03 DIAGNOSIS — R0602 Shortness of breath: Secondary | ICD-10-CM | POA: Diagnosis not present

## 2016-05-03 DIAGNOSIS — I10 Essential (primary) hypertension: Secondary | ICD-10-CM | POA: Diagnosis not present

## 2016-05-05 ENCOUNTER — Other Ambulatory Visit: Payer: Self-pay | Admitting: Family Medicine

## 2016-05-05 DIAGNOSIS — R269 Unspecified abnormalities of gait and mobility: Secondary | ICD-10-CM

## 2016-05-14 ENCOUNTER — Ambulatory Visit
Admission: RE | Admit: 2016-05-14 | Discharge: 2016-05-14 | Disposition: A | Discharge status: Home / Self Care | Payer: Medicare Other | Source: Ambulatory Visit | Attending: Family Medicine | Admitting: Family Medicine

## 2016-05-14 DIAGNOSIS — R269 Unspecified abnormalities of gait and mobility: Secondary | ICD-10-CM

## 2016-05-14 DIAGNOSIS — R2689 Other abnormalities of gait and mobility: Secondary | ICD-10-CM | POA: Diagnosis not present

## 2016-05-17 DIAGNOSIS — I10 Essential (primary) hypertension: Secondary | ICD-10-CM | POA: Diagnosis not present

## 2016-05-23 DIAGNOSIS — N183 Chronic kidney disease, stage 3 (moderate): Secondary | ICD-10-CM | POA: Diagnosis not present

## 2016-05-23 DIAGNOSIS — R260 Ataxic gait: Secondary | ICD-10-CM | POA: Diagnosis not present

## 2016-05-23 DIAGNOSIS — I10 Essential (primary) hypertension: Secondary | ICD-10-CM | POA: Diagnosis not present

## 2016-05-23 DIAGNOSIS — E1122 Type 2 diabetes mellitus with diabetic chronic kidney disease: Secondary | ICD-10-CM | POA: Diagnosis not present

## 2016-05-25 ENCOUNTER — Emergency Department (HOSPITAL_COMMUNITY): Payer: Medicare Other

## 2016-05-25 ENCOUNTER — Encounter (HOSPITAL_COMMUNITY): Payer: Self-pay

## 2016-05-25 ENCOUNTER — Emergency Department (HOSPITAL_COMMUNITY)
Admission: EM | Admit: 2016-05-25 | Discharge: 2016-05-25 | Disposition: A | Discharge status: Home / Self Care | Payer: Medicare Other | Attending: Physician Assistant | Admitting: Physician Assistant

## 2016-05-25 DIAGNOSIS — Z7984 Long term (current) use of oral hypoglycemic drugs: Secondary | ICD-10-CM | POA: Insufficient documentation

## 2016-05-25 DIAGNOSIS — E119 Type 2 diabetes mellitus without complications: Secondary | ICD-10-CM | POA: Insufficient documentation

## 2016-05-25 DIAGNOSIS — I10 Essential (primary) hypertension: Secondary | ICD-10-CM | POA: Insufficient documentation

## 2016-05-25 DIAGNOSIS — R42 Dizziness and giddiness: Secondary | ICD-10-CM

## 2016-05-25 DIAGNOSIS — H5500 Unspecified nystagmus: Secondary | ICD-10-CM | POA: Insufficient documentation

## 2016-05-25 DIAGNOSIS — Z79899 Other long term (current) drug therapy: Secondary | ICD-10-CM | POA: Insufficient documentation

## 2016-05-25 LAB — CBC
HCT: 38.5 % (ref 36.0–46.0)
Hemoglobin: 12.7 g/dL (ref 12.0–15.0)
MCH: 29.5 pg (ref 26.0–34.0)
MCHC: 33 g/dL (ref 30.0–36.0)
MCV: 89.3 fL (ref 78.0–100.0)
Platelets: 263 10*3/uL (ref 150–400)
RBC: 4.31 MIL/uL (ref 3.87–5.11)
RDW: 12.6 % (ref 11.5–15.5)
WBC: 4.2 10*3/uL (ref 4.0–10.5)

## 2016-05-25 LAB — I-STAT TROPONIN, ED: Troponin i, poc: 0 ng/mL (ref 0.00–0.08)

## 2016-05-25 LAB — URINALYSIS, ROUTINE W REFLEX MICROSCOPIC
Bilirubin Urine: NEGATIVE
Glucose, UA: NEGATIVE mg/dL
Hgb urine dipstick: NEGATIVE
Ketones, ur: NEGATIVE mg/dL
Leukocytes, UA: NEGATIVE
Nitrite: NEGATIVE
Protein, ur: NEGATIVE mg/dL
Specific Gravity, Urine: 1.01 (ref 1.005–1.030)
pH: 7.5 (ref 5.0–8.0)

## 2016-05-25 LAB — BASIC METABOLIC PANEL
Anion gap: 8 (ref 5–15)
BUN: 14 mg/dL (ref 6–20)
CO2: 26 mmol/L (ref 22–32)
Calcium: 10.6 mg/dL — ABNORMAL HIGH (ref 8.9–10.3)
Chloride: 100 mmol/L — ABNORMAL LOW (ref 101–111)
Creatinine, Ser: 0.9 mg/dL (ref 0.44–1.00)
GFR calc Af Amer: >60 mL/min (ref >60)
GFR calc non Af Amer: >60 mL/min (ref >60)
Glucose, Bld: 132 mg/dL — ABNORMAL HIGH (ref 65–99)
Potassium: 4 mmol/L (ref 3.5–5.1)
Sodium: 134 mmol/L — ABNORMAL LOW (ref 135–145)

## 2016-05-25 LAB — CBG MONITORING, ED: Glucose-Capillary: 134 mg/dL — ABNORMAL HIGH (ref 65–99)

## 2016-05-25 MED ORDER — MECLIZINE HCL 12.5 MG PO TABS: 12.5000 mg | ORAL_TABLET | Freq: Three times a day (TID) | ORAL | 0 refills | Status: DC | PRN

## 2016-05-25 MED ORDER — MECLIZINE HCL 25 MG PO TABS
25.0000 mg | ORAL_TABLET | Freq: Once | ORAL | Status: AC
  Administered 2016-05-25: 25 mg via ORAL
  Filled 2016-05-25: qty 1

## 2016-05-25 NOTE — ED Provider Notes (Signed)
[facility] DEPT Provider Note   CSN: [id number] Arrival date & time: 05/25/16  1024     History   Chief Complaint Chief Complaint  Patient presents with  . Weakness  . Dizziness    HPI Carolyn [name] is a 75 y.o. female.  HPI   Patient is a 74 her old female with 3 month history of dizziness. Patient reports it's worse with position change. Patient's had extensive workup done for this as an outpatient including MRI lab work. Patient seen multiple physicians but is here for a third opinion. MRI was normal and was completed 2 weeks ago. Lab work is all been normal. Patient states she is unable to tell when it is worse or better however lying down and sitting up seems to induce it occasionally. Not associated with exertion. No chest pain.  Patient denies any headache, shortness breath,  Past Medical History:  Diagnosis Date  . Anxiety   . Arthritis   . Depression   . Diabetes mellitus ([place])   . GERD (gastroesophageal reflux disease)   . Hepatic steatosis   . Hyperlipidemia   . Hypertension   . IBS (irritable bowel syndrome)   . Internal hemorrhoid   . Tubular adenoma of colon 1999   colonoscopy    Patient Active Problem List   Diagnosis Date Noted  . Precordial chest pain 12/23/2014  . Hypercalcemia 12/23/2014  . Diabetes ([place]) 10/23/2013  . HTN (hypertension) 10/23/2013    Past Surgical History:  Procedure Laterality Date  . ANKLE FRACTURE SURGERY Left   . BACK SURGERY  04/2011  . VAGINAL HYSTERECTOMY  1964    OB History    No data available       Home Medications    Prior to Admission medications   Medication Sig Start Date End Date Taking? Authorizing Provider  CELEBREX 200 MG capsule Take 200 mg by mouth daily.  01/05/12  Yes Historical Provider, MD  citalopram (CELEXA) 20 MG tablet Take 20 mg by mouth daily.  01/05/12  Yes Historical Provider, MD  losartan-hydrochlorothiazide (HYZAAR) 100-12.5 MG per tablet Take 1 tablet by mouth daily.  12/16/11   Yes Historical Provider, MD  metFORMIN (GLUCOPHAGE-XR) 500 MG 24 hr tablet Take 500-1,000 mg by mouth 2 (two) times daily. Take 2 tablets in the morning, and 1 tablet in the evening. 11/23/11  Yes Historical Provider, MD  omeprazole (PRILOSEC) 20 MG capsule Take 20 mg by mouth daily.  11/12/11  Yes Historical Provider, MD    Family History Family History  Problem Relation Age of Onset  . Stomach cancer Sister   . Colon polyps Sister     hyperplastic  . Colon cancer Neg Hx     Social History Social History  Substance Use Topics  . Smoking status: Never Smoker  . Smokeless tobacco: Never Used  . Alcohol use No     Allergies   Sulfa antibiotics   Review of Systems Review of Systems  Constitutional: Negative for activity change.  HENT: Negative for congestion, ear pain, hearing loss and nosebleeds.   Eyes: Negative for visual disturbance.  Respiratory: Negative for shortness of breath.   Cardiovascular: Negative for chest pain.  Gastrointestinal: Negative for abdominal pain.  Genitourinary: Negative for dysuria.  Neurological: Positive for dizziness and light-headedness.  All other systems reviewed and are negative.    Physical Exam Updated Vital Signs BP 183/100 (BP Location: Right Arm)   Pulse 61   Temp 98.1 F (36.7 C) (Oral)  Resp 18   SpO2 100%   Physical Exam  Constitutional: She is oriented to person, place, and time. She appears well-developed and well-nourished.  HENT:  Head: Normocephalic and atraumatic.  Right Ear: External ear normal.  Left Ear: External ear normal.  Mild right gaze nystagmus  Eyes: EOM are normal. Pupils are equal, round, and reactive to light. Right eye exhibits no discharge. Left eye exhibits no discharge.  Neck: Normal range of motion.  Cardiovascular: Normal rate.   Pulmonary/Chest: Effort normal.  Neurological: She is oriented to person, place, and time. No cranial nerve deficit. Coordination normal.  Skin: Skin is warm and  dry. She is not diaphoretic.  Psychiatric: She has a normal mood and affect.  Nursing note and vitals reviewed.    ED Treatments / Results  Labs (all labs ordered are listed, but only abnormal results are displayed) Labs Reviewed  BASIC METABOLIC PANEL - Abnormal; Notable for the following:       Result Value   Sodium 134 (*)    Chloride 100 (*)    Glucose, Bld 132 (*)    Calcium 10.6 (*)    All other components within normal limits  CBG MONITORING, ED - Abnormal; Notable for the following:    Glucose-Capillary 134 (*)    All other components within normal limits  URINE CULTURE  CBC  URINALYSIS, ROUTINE W REFLEX MICROSCOPIC (NOT AT [facility])  I-STAT TROPOININ, ED    EKG  EKG Interpretation  Date/Time:  Wednesday May 25 2016 10:28:05 EDT Ventricular Rate:  74 PR Interval:  180 QRS Duration: 86 QT Interval:  368 QTC Calculation: 408 R Axis:   43 Text Interpretation:  Sinus rhythm with occasional Premature ventricular complexes Cannot rule out Anterior infarct , age undetermined Abnormal ECG Normal sinus rhythm Confirmed by Gerald Leitz ([id number]) on 05/25/2016 11:07:30 AM       Radiology Dg Chest 2 View  Result Date: 05/25/2016 CLINICAL DATA:  Dizziness.  Hypertension and diabetes EXAM: CHEST  2 VIEW COMPARISON:  04/15/2016 FINDINGS: The heart size and mediastinal contours are within normal limits. Both lungs are clear. The visualized skeletal structures are unremarkable. IMPRESSION: No active cardiopulmonary disease. Electronically Signed   By: Kerby Moors M.D.   On: 05/25/2016 11:43    Procedures Procedures (including critical care time)  Medications Ordered in ED Medications  meclizine (ANTIVERT) tablet 25 mg (25 mg Oral Given 05/25/16 1301)     Initial Impression / Assessment and Plan / ED Course  I have reviewed the triage vital signs and the nursing notes.  Pertinent labs & imaging results that were available during my care of the patient were  reviewed by me and considered in my medical decision making (see chart for details).  Clinical Course   Patient 75 year old female presenting with positional dizziness. Patient has had extensive workup including MRI done for this recently. We will do repeat of labs urine and chest x-ray to make sure she is no occult infection. If negative we will try patient on meclizine. I think this likely peripheral vertigo given that the MRI is negative. We will have her follow-up with ENT. On exam to my [place] could not elicit dizziness. However she does have some dizziness with moving from sitting to lying down position. I'm concerned about some type of inner ear issue and we'll have ENT follow-up patient.   Given this is been going on for several months, I do not see any further need for emergent workup at this  time.  1:48 PM  Patient ambulaed with out issue. Only when changing positiion does she have symtpoms. Likely BPPV.  Will educate, and send home with follow up with ENT.    Final Clinical Impressions(s) / ED Diagnoses   Final diagnoses:  None    New Prescriptions New Prescriptions   No medications on file     Courteney Julio Alm, MD 05/25/16 1348

## 2016-05-25 NOTE — ED Notes (Signed)
Patient transported to X-ray 

## 2016-05-25 NOTE — Discharge Instructions (Signed)
We are unsure what is causing your vertigo however we think it could be benign positional vertigo. Please use the prescription prescribed at it and then follow up with an ENT provider soon as possible.

## 2016-05-25 NOTE — ED Notes (Addendum)
Patient has returned from being out of the department; patient made aware of need of urine sample; patient is at this time up ambulatory without any difficulty or distress to the bathroom to attempt an urine specimen; visitor at bedside

## 2016-05-25 NOTE — ED Notes (Signed)
Pt has some dizziness with position change , pt has a hx of vertigo but that was around 20 yrs ago

## 2016-05-25 NOTE — ED Triage Notes (Signed)
Patient here with ongoing dizziness, weakness and unsteady gait. Has had MRI, blood work, Social research officer, government. That have all been negative. Patient alert and oriented on arrival, NAD

## 2016-05-25 NOTE — ED Notes (Signed)
Patient undressed, in gown, on monitor, continuous pulse oximetry and blood pressure cuff; visitor at bedside 

## 2016-05-25 NOTE — ED Notes (Signed)
Patient has returned back from the bathroom; patient placed back on monitor, continuous pulse oximetry and blood pressure cuff; visitor at bedside

## 2016-05-25 NOTE — ED Notes (Signed)
Pt able to ambulate in halls with no problems

## 2016-05-26 LAB — URINE CULTURE: Culture: NO GROWTH

## 2016-06-14 DIAGNOSIS — R42 Dizziness and giddiness: Secondary | ICD-10-CM | POA: Diagnosis not present

## 2016-06-20 DIAGNOSIS — E1129 Type 2 diabetes mellitus with other diabetic kidney complication: Secondary | ICD-10-CM | POA: Diagnosis not present

## 2016-06-20 DIAGNOSIS — I10 Essential (primary) hypertension: Secondary | ICD-10-CM | POA: Diagnosis not present

## 2016-06-20 DIAGNOSIS — E1122 Type 2 diabetes mellitus with diabetic chronic kidney disease: Secondary | ICD-10-CM | POA: Diagnosis not present

## 2016-06-20 DIAGNOSIS — E782 Mixed hyperlipidemia: Secondary | ICD-10-CM | POA: Diagnosis not present

## 2016-06-22 DIAGNOSIS — I10 Essential (primary) hypertension: Secondary | ICD-10-CM | POA: Diagnosis not present

## 2016-06-22 DIAGNOSIS — E782 Mixed hyperlipidemia: Secondary | ICD-10-CM | POA: Diagnosis not present

## 2016-06-22 DIAGNOSIS — E1122 Type 2 diabetes mellitus with diabetic chronic kidney disease: Secondary | ICD-10-CM | POA: Diagnosis not present

## 2016-06-22 DIAGNOSIS — K219 Gastro-esophageal reflux disease without esophagitis: Secondary | ICD-10-CM | POA: Diagnosis not present

## 2016-07-19 ENCOUNTER — Ambulatory Visit (INDEPENDENT_AMBULATORY_CARE_PROVIDER_SITE_OTHER): Payer: Medicare Other | Admitting: Neurology

## 2016-07-19 ENCOUNTER — Encounter: Payer: Self-pay | Admitting: Neurology

## 2016-07-19 VITALS — BP 173/95 | HR 65 | Ht 67.0 in | Wt 181.8 lb

## 2016-07-19 DIAGNOSIS — R29898 Other symptoms and signs involving the musculoskeletal system: Secondary | ICD-10-CM | POA: Diagnosis not present

## 2016-07-19 DIAGNOSIS — M48062 Spinal stenosis, lumbar region with neurogenic claudication: Secondary | ICD-10-CM

## 2016-07-19 DIAGNOSIS — R270 Ataxia, unspecified: Secondary | ICD-10-CM | POA: Diagnosis not present

## 2016-07-19 DIAGNOSIS — W19XXXA Unspecified fall, initial encounter: Secondary | ICD-10-CM

## 2016-07-19 NOTE — Patient Instructions (Addendum)
Remember to drink plenty of fluid, eat healthy meals and do not skip any meals. Try to eat protein with a every meal and eat a healthy snack such as fruit or nuts in between meals. Try to keep a regular sleep-wake schedule and try to exercise daily, particularly in the form of walking, 20-30 minutes a day, if you can.   As far as diagnostic testing: MRI lumbar spine, physical therapy  I would like to see you back as needed, sooner if we need to. Please call us with any interim questions, concerns, problems, updates or refill requests.   Our phone number is [phone]. We also have an after hours call service for urgent matters and there is a physician on-call for urgent questions. For any emergencies you know to call 911 or go to the nearest emergency room

## 2016-07-19 NOTE — Progress Notes (Signed)
[facility]    Provider:  Dr Jaynee Eagles Referring Provider: Caryl Bis, MD Primary Care Physician:  Gar Ponto, MD  CC:  Dizziness  HPI:  Carolyn [name] is a 75 y.o. female here as a referral from Dr. Quillian Quince for impaired balance. Medical history of hyperlipidemia, hypertension, gastroesophageal reflux disease, obesity, diabetes, osteoarthritis, chronic renal failure, depression, insomnia. The peripheral vertigo is better. It is balance now, she has fallen 4 times. Started after or in conjunction with the dizziness. Always when she bends over. She has fallen 4 times. When she bends at the waist she falls. The last time she was taking her dog out front and she just lost her balance, she has never this issue before. Unclear if dizzines sis involved. If she gets up she has to hold on get going, she denies any lightheadedness or dizziness or CP or shortness of breath. She has been having problems with the back of her left leg and gives way. But that is not causing the falls. One fall she was bending over and fell, the other time going downs stairs, the other times out in the garden. No alteration of awareness, no loss of consciousness or confusion. No dizziness, no lightheadedness. If she gets up quickly she feels wobbly. She can;t walk a straight line. She is worsening even since October since the last MRI. Ni new numbness or tingling. She feels she is generally weak. She is sleeping more, more fatigue. She works as a Char[employer] fundraiser. If she does a lot, tomorrow she is very tired and exhausted. She has difficulty walking because of feeling off balance. Shuflfing feet a little more. She has low back pain but no radicular symptoms. Mostly weakness in the legs. She has chronic low back pain, feels better with a cart in the store,   Reviewed notes, labs and imaging from outside physicians, which showed:   Hemoglobin A1c 6.6, TSH 1.66. BUN 16, creatinine 1.01 06/21/2016. LDL 136 06/21/2016.  CMP unremarkable.  Patient was seen in the emergency room on 05/25/2016 with a 3 month history of dizziness. Worsening with position change. Extensive workup done for this outpatient including MRI lab work, she seen multiple physicians, MRI was normal, lab work normal. Not associated with exertion no chest pain. Explained that it was more positional. Labs urine and chest x-ray were unremarkable. A started her meclizine.  She presented to [facility] for dizziness. She had a hearing evaluation and a Dix-Hallpike assessment. Thresholds of 15 dB were obtained for the right and left ear. Speech clearly testing revealed word recognition scores of 80% and 96% for the right and left ears respectively. The hearing evaluation revealed normal hearing sensitivity in both ears from [phone] Hz. In the right ear, thresholds for 6000 and 8000 were normal in the left ear mild to moderate loss was present at those frequencies. Recommendations included annual hearing evaluations. Patient was evaluated for dizziness, feeling like she was off balance especially with walking, following several times when squatting down, no fainting, did not injure herself, being active makes her more dizzy, no true vertigo at any time, no headache, she has pressure in both ears but no real change in hearing, change in vision, she was seen in the emergency room 2 weeks ago with an MRI scan showing age-related changes but no specific lesions. She had laboratory studies obtained in her primary physician's office with no significant abnormalities. She is diabetic but reportedly well controlled. No relation to meals. She does not  have any trouble lying back in the hairdresser sitting. She denies arrhythmias. No trouble driving but is uneasy walking up stairs. No history of neuropathy. Never been to a neurologist.  Personally reviewed images of the brain MRI 05/2016 and agree with the following:  COMPARISON:  None.  FINDINGS: Brain: No evidence  for acute infarction, hemorrhage, mass lesion, or extra-axial fluid. Global atrophy. Hydrocephalus ex vacuo. Extensive white matter disease, favored to represent chronic microvascular ischemic change.  Vascular: Flow voids are maintained throughout the carotid, basilar, and vertebral arteries. There are no areas of chronic hemorrhage.  Skull and upper cervical spine: Normal marrow signal.  Sinuses/Orbits: No orbital masses or proptosis. Globes appear symmetric. Sinuses appear well aerated, without evidence for air-fluid level.  Other: No nasopharyngeal pathology or mastoid fluid. Scalp and other visualized extracranial soft tissues grossly unremarkable.  IMPRESSION: Atrophy and small vessel disease.  No acute intracranial abnormality.  Review of Systems Patient complains of symptoms per HPI as well as the following symptoms: No CP, no SOB. Pertinent negatives per HPI. All others negative.   Social History   Social History  . Marital status: Widowed    Spouse name: N/A  . Number of children: 3  . Years of education: 10   Occupational History  . retired    Social History Main Topics  . Smoking status: Never Smoker  . Smokeless tobacco: Never Used  . Alcohol use No  . Drug use: No  . Sexual activity: Not on file   Other Topics Concern  . Not on file   Social History Narrative   Lives alone   Daily caffeine    Family History  Problem Relation Age of Onset  . Stomach cancer Sister   . Colon polyps Sister     hyperplastic  . Colon cancer Neg Hx     Past Medical History:  Diagnosis Date  . Anxiety   . Arthritis   . Depression   . Diabetes mellitus ([place])   . GERD (gastroesophageal reflux disease)   . Hepatic steatosis   . Hyperlipidemia   . Hypertension   . IBS (irritable bowel syndrome)   . Internal hemorrhoid   . Tubular adenoma of colon 1999   colonoscopy    Past Surgical History:  Procedure Laterality Date  . ANKLE FRACTURE SURGERY Left    . BACK SURGERY  04/2011  . VAGINAL HYSTERECTOMY  1964    Current Outpatient Prescriptions  Medication Sig Dispense Refill  . CELEBREX 200 MG capsule Take 200 mg by mouth daily.     . citalopram (CELEXA) 20 MG tablet Take 20 mg by mouth daily.     Marland Kitchen loratadine (ALLERGY) 10 MG tablet Take 10 mg by mouth daily.    Marland Kitchen losartan-hydrochlorothiazide (HYZAAR) 100-12.5 MG per tablet Take 1 tablet by mouth daily.     . Melatonin 3 MG CAPS Take 1 capsule by mouth daily.    . metFORMIN (GLUCOPHAGE-XR) 500 MG 24 hr tablet Take 500-1,000 mg by mouth 3 (three) times daily. Take 2 tablets in the morning, and 1 tablet in the evening.    Marland Kitchen omeprazole (PRILOSEC) 20 MG capsule Take 20 mg by mouth daily.      No current facility-administered medications for this visit.     Allergies as of 07/19/2016 - Review Complete 07/19/2016  Allergen Reaction Noted  . Sulfa antibiotics Rash 01/10/2012    Vitals: BP (!) 173/95 (BP Location: Right Arm, Patient Position: Sitting, Cuff Size: Normal)   Pulse  65   Ht 5\' 7"  (1.702 m)   Wt 181 lb 12.8 oz (82.5 kg)   BMI 28.47 kg/m  Last Weight:  Wt Readings from Last 1 Encounters:  07/19/16 181 lb 12.8 oz (82.5 kg)   Last Height:   Ht Readings from Last 1 Encounters:  07/19/16 5\' 7"  (1.702 m)     Physical exam: Exam: Gen: NAD, conversant, well nourised, obese, well groomed                     CV: RRR, no MRG. No Carotid Bruits. No peripheral edema, warm, nontender Eyes: Conjunctivae clear without exudates or hemorrhage  Neuro: Detailed Neurologic Exam  Speech:    Speech is normal; fluent and spontaneous with normal comprehension.  Cognition:    The patient is oriented to person, place, and time;     recent and remote memory intact;     language fluent;     normal attention, concentration,     fund of knowledge Cranial Nerves:    The pupils are equal, round, and reactive to light. The fundi are normal and spontaneous venous pulsations are present.  Visual fields are full to finger confrontation. Extraocular movements are intact. Trigeminal sensation is intact and the muscles of mastication are normal. The face is symmetric. The palate elevates in the midline. Hearing intact. Voice is normal. Shoulder shrug is normal. The tongue has normal motion without fasciculations.   Coordination:    Normal finger to nose and heel to shin. Normal rapid alternating movements.   Gait: Difficulty getting out of seat without using hands. Mild shuffling and wide based. Heel-toe and tandem gait intact but with imbalance  Motor Observation:    No asymmetry, no atrophy, and no involuntary movements noted. Tone:    Normal muscle tone.    Posture:    Posture is normal. normal erect    Strength: proximal weakness otherwise    Strength is V/V in the upper and lower limbs.      Sensation: intact to LT     Reflex Exam:  DTR's:    Absent AJs otherwise deep tendon reflexes in the upper and lower extremities are very brisk bilaterally.   Toes:    The toes are equivocal bilaterally.   Clonus:    Clonus is absent.     Assessment/Plan:  75 year old with weakness of the legs, neurogenic claudication, falls. Rule out spinal stenosis.  Physical therapy in [place], [phone] through [facility], patient requests [facility]. If spinal stenosis will send to Dr. Matilde Haymaker who has performed her surgeries in the past, otherwise call patient for further workup and schedule emg/ncs and labwork for other causes of weakness and possible mri cervical spine. In the meantime discussed fall risks and fall prevention,and the need for physical therapy. Should have an emergency necklace or have someone with her closeby at home in case she falls.   Cc: Gar Ponto, MD   Sarina Ill, MD  [facility] [address] [place], [place] [address]  Phone [phone] Fax [phone]

## 2016-07-21 DIAGNOSIS — S60212A Contusion of left wrist, initial encounter: Secondary | ICD-10-CM | POA: Diagnosis not present

## 2016-07-21 DIAGNOSIS — S60222A Contusion of left hand, initial encounter: Secondary | ICD-10-CM | POA: Diagnosis not present

## 2016-07-25 DIAGNOSIS — J069 Acute upper respiratory infection, unspecified: Secondary | ICD-10-CM | POA: Diagnosis not present

## 2016-07-25 DIAGNOSIS — R05 Cough: Secondary | ICD-10-CM | POA: Diagnosis not present

## 2016-08-03 ENCOUNTER — Ambulatory Visit
Admission: RE | Admit: 2016-08-03 | Discharge: 2016-08-03 | Disposition: A | Discharge status: Home / Self Care | Payer: Medicare Other | Source: Ambulatory Visit | Attending: Neurology | Admitting: Neurology

## 2016-08-03 DIAGNOSIS — M48062 Spinal stenosis, lumbar region with neurogenic claudication: Secondary | ICD-10-CM | POA: Diagnosis not present

## 2016-08-03 DIAGNOSIS — M48061 Spinal stenosis, lumbar region without neurogenic claudication: Secondary | ICD-10-CM | POA: Diagnosis not present

## 2016-08-03 DIAGNOSIS — W19XXXA Unspecified fall, initial encounter: Secondary | ICD-10-CM

## 2016-08-03 DIAGNOSIS — R270 Ataxia, unspecified: Secondary | ICD-10-CM

## 2016-08-03 DIAGNOSIS — R29898 Other symptoms and signs involving the musculoskeletal system: Secondary | ICD-10-CM

## 2016-08-05 ENCOUNTER — Telehealth: Payer: Self-pay | Admitting: Neurology

## 2016-08-05 NOTE — Telephone Encounter (Signed)
Per Dr Leta Baptist, spoke with patient and informed her that her MRI lumbar spine showed several areas of degenerative changes and "pinched nerves". Advised she may follow up with ortho/spine clinic (Dr. Gladstone Lighter), as Dr Cathren Laine note indicated. Advised patient this note will be sent to Dr Jaynee Eagles who returns to the office next week. She verbalized understanding, appeciation .

## 2016-08-05 NOTE — Telephone Encounter (Signed)
Thanks. I'll call patient next week as well.

## 2016-08-05 NOTE — Telephone Encounter (Signed)
Multi-level degenerative changes and "pinched nerves". May follow up with ortho/spine clinic (Dr. Gladstone Lighter). -VRP

## 2016-08-05 NOTE — Telephone Encounter (Signed)
Patient calling requesting MRI results.

## 2016-08-10 DIAGNOSIS — M1711 Unilateral primary osteoarthritis, right knee: Secondary | ICD-10-CM | POA: Diagnosis not present

## 2016-08-10 DIAGNOSIS — M545 Low back pain: Secondary | ICD-10-CM | POA: Diagnosis not present

## 2016-08-10 DIAGNOSIS — M1712 Unilateral primary osteoarthritis, left knee: Secondary | ICD-10-CM | POA: Diagnosis not present

## 2016-08-15 ENCOUNTER — Ambulatory Visit: Payer: Medicare Other | Admitting: Physical Therapy

## 2016-08-16 DIAGNOSIS — R1030 Lower abdominal pain, unspecified: Secondary | ICD-10-CM | POA: Diagnosis not present

## 2016-08-16 DIAGNOSIS — R300 Dysuria: Secondary | ICD-10-CM | POA: Diagnosis not present

## 2016-08-19 ENCOUNTER — Ambulatory Visit: Payer: Medicare Other | Admitting: Rehabilitation

## 2016-08-29 DIAGNOSIS — M5442 Lumbago with sciatica, left side: Secondary | ICD-10-CM | POA: Diagnosis not present

## 2016-08-29 DIAGNOSIS — M5441 Lumbago with sciatica, right side: Secondary | ICD-10-CM | POA: Diagnosis not present

## 2016-08-30 NOTE — Telephone Encounter (Signed)
Dr Ahern- please advise 

## 2016-08-30 NOTE — Telephone Encounter (Signed)
Patient calling to discuss her MRI results.

## 2016-08-31 ENCOUNTER — Telehealth: Payer: Self-pay | Admitting: Neurology

## 2016-08-31 NOTE — Telephone Encounter (Signed)
error 

## 2016-08-31 NOTE — Telephone Encounter (Addendum)
Called and spoke to pt. Relayed Dr Cathren Laine message below. She states that she wants to think about her options and she will call back and let us know. She did not do physical therapy because it was too expensive for her. Advised Dr Jaynee Eagles out of office until Monday. If she calls back this week, we can route to Dr Jaynee Eagles how she wants to proceed.  She states she saw Dr Gladstone Lighter who could not see MRI results in system. Advised results are in EPIC. He should be able to see them. She is going back to see him on 09/12/16.

## 2016-08-31 NOTE — Telephone Encounter (Signed)
Carolyn [name], I have tried calling patient but voicemail picks up and unfortunately I am traveling today. Can you call her back and discuss what I have below and see what she wants to do or if she wants a call back from me still or possibly an appt for emg/ncs?   The MRi of the lumbar spine showed some degenerative (arthritic) changes and possible a nerve pinching on the left L5 which may be why her left leg gives out but it doesn;t entirely explain her weakness and imbalance as there was no significant central spinal canal stenosis. MRI of the brain was negative for etiology as well. I referred her to physical therapy but I don;t see any notes out there tht she went - is she in physical therapy? And if not, why not? I do highly recommend PT and think she should go especially since she is a fall risk and her balance will improve. There are a few more tests we can do for her including looking at her cervical spine to make sure there is no spinal issues there(unfortunately again another MRI) and we could also perform an emg/ncs and some labs to make sure her muscles are not affected by muscle disease causing her weakness.  I suggest we schedule an emg/ncs of one arm and one leg. If that is negative we can perform some labs and review her MRI when she is here. Let me know what she says or if she still wants a call back from me. You can placer her in any appointment for emg/ncs (not just Thursday, any day and any type of appt will work) if [employer] and Edwena Felty can do it. Alternatively she can complete physical therapy and if she does not feel improved then we can proceed with the above additional workup.   Thanks Carolyn [name].

## 2016-08-31 NOTE — Telephone Encounter (Signed)
Called and spoke to pt. She requested I call back in 5 min or so because she was at the car wash and could not hear me. Advised I will call her back. She verbalized understanding.

## 2016-09-01 NOTE — Telephone Encounter (Signed)
Called and LVM for pt relaying per AA,MD for her to pick up CD of MRI from [facility]. She can call [phone] to get a copy made to bring with her to Dr Charlestine Night appt. Told her to call back if she has any more questions.

## 2016-09-01 NOTE — Telephone Encounter (Signed)
She should bring Dr. Gladstone Lighter a disk with her imaging on it so he can see it. Give her instructions how to do that thanks.  thanls

## 2016-09-01 NOTE — Telephone Encounter (Signed)
Dr Ahern- FYI 

## 2016-09-12 DIAGNOSIS — M5441 Lumbago with sciatica, right side: Secondary | ICD-10-CM | POA: Diagnosis not present

## 2016-09-12 DIAGNOSIS — M5442 Lumbago with sciatica, left side: Secondary | ICD-10-CM | POA: Diagnosis not present

## 2016-09-14 ENCOUNTER — Telehealth: Payer: Self-pay | Admitting: Neurology

## 2016-09-14 NOTE — Telephone Encounter (Signed)
Patient is calling to discuss the appointment she had with Dr. Matilde Haymaker.

## 2016-09-15 NOTE — Telephone Encounter (Signed)
Returned call and spoke to pt. Said that she continues to have balance issues. Reports that she did see Dr. Gladstone Lighter and he did not feel that her instability is related to her back. Also told her that her back problems are not bad enough for additional surgery and that she should follow-up again w/ Dr. Jaynee Eagles. States that she's not had any additional falls but still does not feel steady. Return appt scheduled for 10/04/16. Pt verbalized understanding and appreciation for call.

## 2016-10-04 ENCOUNTER — Ambulatory Visit (INDEPENDENT_AMBULATORY_CARE_PROVIDER_SITE_OTHER): Payer: Medicare Other | Admitting: Neurology

## 2016-10-04 ENCOUNTER — Encounter: Payer: Self-pay | Admitting: Neurology

## 2016-10-04 VITALS — BP 162/97 | HR 62 | Ht 67.0 in | Wt 180.4 lb

## 2016-10-04 DIAGNOSIS — E538 Deficiency of other specified B group vitamins: Secondary | ICD-10-CM

## 2016-10-04 DIAGNOSIS — R531 Weakness: Secondary | ICD-10-CM | POA: Diagnosis not present

## 2016-10-04 DIAGNOSIS — R7309 Other abnormal glucose: Secondary | ICD-10-CM

## 2016-10-04 DIAGNOSIS — R269 Unspecified abnormalities of gait and mobility: Secondary | ICD-10-CM | POA: Diagnosis not present

## 2016-10-04 DIAGNOSIS — W19XXXA Unspecified fall, initial encounter: Secondary | ICD-10-CM | POA: Diagnosis not present

## 2016-10-04 DIAGNOSIS — R270 Ataxia, unspecified: Secondary | ICD-10-CM

## 2016-10-04 DIAGNOSIS — G629 Polyneuropathy, unspecified: Secondary | ICD-10-CM | POA: Diagnosis not present

## 2016-10-04 DIAGNOSIS — W19XXXD Unspecified fall, subsequent encounter: Secondary | ICD-10-CM

## 2016-10-04 DIAGNOSIS — E519 Thiamine deficiency, unspecified: Secondary | ICD-10-CM

## 2016-10-04 DIAGNOSIS — E531 Pyridoxine deficiency: Secondary | ICD-10-CM

## 2016-10-04 DIAGNOSIS — G63 Polyneuropathy in diseases classified elsewhere: Secondary | ICD-10-CM

## 2016-10-04 NOTE — Progress Notes (Addendum)
[id number]    Provider:  Dr Jaynee Eagles Referring Provider: Caryl Bis, MD Primary Care Physician:  Gar Ponto, MD  CC:  Dizziness  Addendum 10/15/2016: Called patient regarding labs and MRI cervical spine.  IFE showed IgM monoclonal protein with kappa light chain specificity. Needs referral to hematology.  CK slightly elevated may not be clinically relevant HgbA1c 6.5  ANA + and DSDNA positive maybe a false positive need ANA titer and IFA MRI cervical spine shows moderate cervical stenosis unclear if this is symptomatic and cause for her ataxia and falls, refer to [facility] Emg/ncs pending   Interval history: She is back for imbalance. She can't afford physical therapy. She has had one fall. She feels generally weak. She has spells of weakness.She does not participate in any exercise, no balance exercise. She can't walk. She has to "get her feet going" sometimes.  Symptoms started last August and walking impairment is progressive. She falls over due to imbalance. She needs to hold onto things. She feels her body goes faster than her legs. She is active, goes into the yard. Friend is here and there may be some mild memory changes however she is independent. Bills are pain, she performs all her own ADLs and IADLs. She has fallen since being seen.   HPI:  Carolyn [name] is a 76 y.o. female here as a referral from Dr. Quillian Quince for impaired balance. Medical history of hyperlipidemia, hypertension, gastroesophageal reflux disease, obesity, diabetes, osteoarthritis, chronic renal failure, depression, insomnia. The peripheral vertigo is better. It is balance now, she has fallen 4 times. Started after or in conjunction with the dizziness. Always when she bends over. She has fallen 4 times. When she bends at the waist she falls. The last time she was taking her dog out front and she just lost her balance, she has never this issue before. Unclear if dizzines sis involved. If she gets up  she has to hold on get going, she denies any lightheadedness or dizziness or CP or shortness of breath. She has been having problems with the back of her left leg and gives way. But that is not causing the falls. One fall she was bending over and fell, the other time going downs stairs, the other times out in the garden. No alteration of awareness, no loss of consciousness or confusion. No dizziness, no lightheadedness. If she gets up quickly she feels wobbly. She can;t walk a straight line. She is worsening even since October since the last MRI. Ni new numbness or tingling. She feels she is generally weak. She is sleeping more, more fatigue. She works as a Char[employer] fundraiser. If she does a lot, tomorrow she is very tired and exhausted. She has difficulty walking because of feeling off balance. Shuflfing feet a little more. She has low back pain but no radicular symptoms. Mostly weakness in the legs. She has chronic low back pain, feels better with a cart in the store,   Reviewed notes, labs and imaging from outside physicians, which showed:   Hemoglobin A1c 6.6, TSH 1.66. BUN 16, creatinine 1.01 06/21/2016. LDL 136 06/21/2016. CMP unremarkable.  Patient was seen in the emergency room on 05/25/2016 with a 3 month history of dizziness. Worsening with position change. Extensive workup done for this outpatient including MRI lab work, she seen multiple physicians, MRI was normal, lab work normal. Not associated with exertion no chest pain. Explained that it was more positional. Labs urine and chest x-ray were unremarkable. A started  her meclizine.  She presented to [facility] for dizziness. She had a hearing evaluation and a Dix-Hallpike assessment. Thresholds of 15 dB were obtained for the right and left ear. Speech clearly testing revealed word recognition scores of 80% and 96% for the right and left ears respectively. The hearing evaluation revealed normal hearing sensitivity in both ears from [phone] Hz.  In the right ear, thresholds for 6000 and 8000 were normal in the left ear mild to moderate loss was present at those frequencies. Recommendations included annual hearing evaluations. Patient was evaluated for dizziness, feeling like she was off balance especially with walking, following several times when squatting down, no fainting, did not injure herself, being active makes her more dizzy, no true vertigo at any time, no headache, she has pressure in both ears but no real change in hearing, change in vision, she was seen in the emergency room 2 weeks ago with an MRI scan showing age-related changes but no specific lesions. She had laboratory studies obtained in her primary physician's office with no significant abnormalities. She is diabetic but reportedly well controlled. No relation to meals. She does not have any trouble lying back in the hairdresser sitting. She denies arrhythmias. No trouble driving but is uneasy walking up stairs. No history of neuropathy. Never been to a neurologist.  Personally reviewed images of the brain MRI 05/2016 and agree with the following:  COMPARISON: None.  FINDINGS: Brain: No evidence for acute infarction, hemorrhage, mass lesion, or extra-axial fluid. Global atrophy. Hydrocephalus ex vacuo. Extensive white matter disease, favored to represent chronic microvascular ischemic change.  Vascular: Flow voids are maintained throughout the carotid, basilar, and vertebral arteries. There are no areas of chronic hemorrhage.  Skull and upper cervical spine: Normal marrow signal.  Sinuses/Orbits: No orbital masses or proptosis. Globes appear symmetric. Sinuses appear well aerated, without evidence for air-fluid level.  Other: No nasopharyngeal pathology or mastoid fluid. Scalp and other visualized extracranial soft tissues grossly unremarkable.  IMPRESSION: Atrophy and small vessel disease.  No acute intracranial abnormality.  Review of  Systems Patient complains of symptoms per HPI as well as the following symptoms: No CP, no SOB. Pertinent negatives per HPI. All others negative.   Social History   Social History  . Marital status: Widowed    Spouse name: N/A  . Number of children: 3  . Years of education: 10   Occupational History  . retired    Social History Main Topics  . Smoking status: Never Smoker  . Smokeless tobacco: Never Used  . Alcohol use No  . Drug use: No  . Sexual activity: Not on file   Other Topics Concern  . Not on file   Social History Narrative   Lives alone   Daily caffeine   Left-handed       Family History  Problem Relation Age of Onset  . Stomach cancer Sister   . Diabetes Sister   . Colon polyps Sister     hyperplastic  . High blood pressure Sister   . Colon cancer Neg Hx     Past Medical History:  Diagnosis Date  . Anxiety   . Arthritis   . Depression   . Diabetes mellitus ([place])   . GERD (gastroesophageal reflux disease)   . Hepatic steatosis   . Hyperlipidemia   . Hypertension   . IBS (irritable bowel syndrome)   . Internal hemorrhoid   . Tubular adenoma of colon 1999   colonoscopy    Past  Surgical History:  Procedure Laterality Date  . ANKLE FRACTURE SURGERY Left   . BACK SURGERY  04/2011  . VAGINAL HYSTERECTOMY  1964    Current Outpatient Prescriptions  Medication Sig Dispense Refill  . CELEBREX 200 MG capsule Take 200 mg by mouth daily.     . citalopram (CELEXA) 20 MG tablet Take 20 mg by mouth daily.     Marland Kitchen glucosamine-chondroitin 500-400 MG tablet Take 1 tablet by mouth daily.    Marland Kitchen loratadine (ALLERGY) 10 MG tablet Take 10 mg by mouth daily.    Marland Kitchen losartan-hydrochlorothiazide (HYZAAR) 100-12.5 MG per tablet Take 1 tablet by mouth daily.     . Melatonin 3 MG CAPS Take 1 capsule by mouth daily.    . metFORMIN (GLUCOPHAGE-XR) 500 MG 24 hr tablet Take 500-1,000 mg by mouth 3 (three) times daily. Take 2 tablets in the morning, and 1 tablet in the  evening.    Marland Kitchen omeprazole (PRILOSEC) 20 MG capsule Take 20 mg by mouth daily.      No current facility-administered medications for this visit.     Allergies as of 10/04/2016 - Review Complete 10/04/2016  Allergen Reaction Noted  . Sulfa antibiotics Rash 01/10/2012    Vitals: BP (!) 162/97   Pulse 62   Ht 5\' 7"  (1.702 m)   Wt 180 lb 6.4 oz (81.8 kg)   BMI 28.25 kg/m  Last Weight:  Wt Readings from Last 1 Encounters:  10/04/16 180 lb 6.4 oz (81.8 kg)   Last Height:   Ht Readings from Last 1 Encounters:  10/04/16 5\' 7"  (1.702 m)   Physical exam: Exam: Gen: NAD, conversant, well nourised, obese, well groomed                     CV: RRR, no MRG. No Carotid Bruits. No peripheral edema, warm, nontender Eyes: Conjunctivae clear without exudates or hemorrhage  Neuro: Detailed Neurologic Exam  Speech:    Speech is normal; fluent and spontaneous with normal comprehension.  Cognition:  MMSE - Mini Mental State Exam 10/04/2016  Orientation to time 5  Orientation to Place 5  Registration 3  Attention/ Calculation 5  Recall 3  Language- name 2 objects 2  Language- repeat 1  Language- follow 3 step command 3  Language- read & follow direction 1  Write a sentence 1  Copy design 1  Total score 30      The patient is oriented to person, place, and time;     recent and remote memory intact;     language fluent;     normal attention, concentration,     fund of knowledge Cranial Nerves:    The pupils are equal, round, and reactive to light. Visual fields are full to finger confrontation. Extraocular movements are intact. Trigeminal sensation is intact and the muscles of mastication are normal. The face is symmetric. The palate elevates in the midline. Hearing intact. Voice is normal. Shoulder shrug is normal. The tongue has normal motion without fasciculations.   Coordination:    Normal finger to nose and heel to shin. Normal rapid alternating movements.   Gait:  Difficulty getting out of seat without using hands. Mild shuffling and bradykinetic. Heel-toe and tandem gait intact but with imbalance. +Romberg. +push test, falls on slight push  Motor Observation:    No asymmetry, no atrophy, and no involuntary movements noted. Tone:    Normal muscle tone.  No cogwheeling.   Posture:    Posture is  normal. normal erect    Strength: Minimal proximal weakness otherwise strength is V/V in the upper and lower limbs.      Sensation: intact to LT, pin prick, temp, vibration distally      Reflex Exam:  DTR's:    Absent AJs otherwise deep tendon reflexes in the upper and lower extremities are very brisk bilaterally.   Toes:   Left upgpoing.   Clonus:    Clonus is absent.  +push test, falls backwards easily with slight push    Assessment/Plan:  76 year old with progressive gait abnormality, weakness of the legs,falls. She is mildly shuffling and bradykinetic with imbalance and she complains of shuffling, and what sounds like freezing and festinating gait  however no other signs on exam of parkinsonism (no tremor, no increased tone). Could consider vascular parkinsonism due to white matter changes but the white matter changes don't appear extensive enough.Unclear etiology. MMSE 30/30.   Physical therapy to the home, she cannot ambulate out of the home alone due to imbalance also nursing to check medication management MRI lumbar spine completed without etiology. Will check MRI cervical spine as left toe is upgoing. In the meantime discussed fall risks and fall prevention,and the need for physical therapy. Should have an emergency necklace or have someone with her closeby at home in case she falls.  Extensive labs today Emg/ncs of one arm and one leg  Addendum 10/15/2016: Called patient regarding labs and MRI cervical spine. IFE showed IgM monoclonal protein with kappa light chain specificity. Needs referral to hematology.  CK slightly elevated may  not be clinically relevant HgbA1c 6.5  ANA + and DSDNA positive maybe a false positive need ANA titer and IFA MRI cervical spine shows moderate cervical stenosis unclear if this is symptomatic and cause for her ataxia and falls, refer to [place] Emg/ncs pending    Cc: Gar Ponto, MD   Sarina Ill, MD  [facility] [address] [place], [place] [address]  Phone [phone] Fax [phone] A total of 40 minutes was spent face-to-face with this patient. Over half this time was spent on counseling patient on the abnormality of gait, falls, imbalance  diagnosis and different diagnostic and therapeutic options available.

## 2016-10-04 NOTE — Patient Instructions (Signed)
Remember to drink plenty of fluid, eat healthy meals and do not skip any meals. Try to eat protein with a every meal and eat a healthy snack such as fruit or nuts in between meals. Try to keep a regular sleep-wake schedule and try to exercise daily, particularly in the form of walking, 20-30 minutes a day, if you can.   As far as diagnostic testing: MRI cervical spine, emg/ncs and labs  I would like to see you back for emg/ncs, sooner if we need to. Please call us with any interim questions, concerns, problems, updates or refill requests.   Our phone number is [phone]. We also have an after hours call service for urgent matters and there is a physician on-call for urgent questions. For any emergencies you know to call 911 or go to the nearest emergency room

## 2016-10-06 ENCOUNTER — Telehealth: Payer: Self-pay | Admitting: Neurology

## 2016-10-06 NOTE — Telephone Encounter (Signed)
Sure. Thanks 

## 2016-10-06 NOTE — Telephone Encounter (Signed)
Gave ok to [employer] . Thanks Hinton Dyer

## 2016-10-06 NOTE — Telephone Encounter (Signed)
Carolyn [name] wants to know if its ok to just go with PT and her insurance will cover medication mgt. Please advise ?

## 2016-10-11 DIAGNOSIS — E119 Type 2 diabetes mellitus without complications: Secondary | ICD-10-CM | POA: Diagnosis not present

## 2016-10-11 DIAGNOSIS — R270 Ataxia, unspecified: Secondary | ICD-10-CM | POA: Diagnosis not present

## 2016-10-11 DIAGNOSIS — M1990 Unspecified osteoarthritis, unspecified site: Secondary | ICD-10-CM | POA: Diagnosis not present

## 2016-10-11 DIAGNOSIS — Z85038 Personal history of other malignant neoplasm of large intestine: Secondary | ICD-10-CM | POA: Diagnosis not present

## 2016-10-11 LAB — ENA+DNA/DS+SJORGEN'S
ENA RNP Ab: <0.2 AI (ref 0.0–0.9)
ENA SM Ab Ser-aCnc: <0.2 AI (ref 0.0–0.9)
dsDNA Ab: 76 IU/mL — ABNORMAL HIGH (ref 0–9)

## 2016-10-11 LAB — MULTIPLE MYELOMA PANEL, SERUM
Albumin SerPl Elph-Mcnc: 4.2 g/dL (ref 2.9–4.4)
Albumin/Glob SerPl: 1.6 (ref 0.7–1.7)
Alpha 1: 0.2 g/dL (ref 0.0–0.4)
Alpha2 Glob SerPl Elph-Mcnc: 0.6 g/dL (ref 0.4–1.0)
B-Globulin SerPl Elph-Mcnc: 1.5 g/dL — ABNORMAL HIGH (ref 0.7–1.3)
Gamma Glob SerPl Elph-Mcnc: 0.7 g/dL (ref 0.4–1.8)
Globulin, Total: 2.8 g/dL (ref 2.2–3.9)
IgA/Immunoglobulin A, Serum: 93 mg/dL (ref 64–422)
IgG (Immunoglobin G), Serum: 790 mg/dL (ref 700–1600)
IgM (Immunoglobulin M), Srm: 565 mg/dL — ABNORMAL HIGH (ref 26–217)
M Protein SerPl Elph-Mcnc: 0.4 g/dL — ABNORMAL HIGH
Total Protein: 7 g/dL (ref 6.0–8.5)

## 2016-10-11 LAB — B12 AND FOLATE PANEL
Folate: 17.9 ng/mL (ref >3.0)
Vitamin B-12: 669 pg/mL (ref 232–1245)

## 2016-10-11 LAB — SEDIMENTATION RATE: Sed Rate: 4 mm/hr (ref 0–40)

## 2016-10-11 LAB — SJOGREN'S SYNDROME ANTIBODS(SSA + SSB)
ENA SSA (RO) Ab: <0.2 AI (ref 0.0–0.9)
ENA SSB (LA) Ab: <0.2 AI (ref 0.0–0.9)

## 2016-10-11 LAB — B. BURGDORFI ANTIBODIES: Lyme IgG/IgM Ab: <0.91 {ISR} (ref 0.00–0.90)

## 2016-10-11 LAB — HEAVY METALS, BLOOD
Arsenic: 9 ug/L (ref 2–23)
Lead, Blood: NOT DETECTED ug/dL (ref 0–19)
Mercury: NOT DETECTED ug/L (ref 0.0–14.9)

## 2016-10-11 LAB — HEMOGLOBIN A1C
Est. average glucose Bld gHb Est-mCnc: 140 mg/dL
Hgb A1c MFr Bld: 6.5 % — ABNORMAL HIGH (ref 4.8–5.6)

## 2016-10-11 LAB — ANA W/REFLEX: Anti Nuclear Antibody(ANA): POSITIVE — AB

## 2016-10-11 LAB — METHYLMALONIC ACID, SERUM: Methylmalonic Acid: 169 nmol/L (ref 0–378)

## 2016-10-11 LAB — ACETYLCHOLINE RECEPTOR, BINDING: AChR Binding Ab, Serum: <0.03 nmol/L (ref 0.00–0.24)

## 2016-10-11 LAB — VITAMIN B6: Vitamin B6: 9.5 ug/L (ref 2.0–32.8)

## 2016-10-11 LAB — VITAMIN B1: Thiamine: 117 nmol/L (ref 66.5–200.0)

## 2016-10-11 LAB — ACETYLCHOLINE RECEPTOR, MODULATING: Acetylcholine Modulat Ab: <12 % (ref 0–20)

## 2016-10-11 LAB — ACETYLCHOLINE RECEPTOR, BLOCKING: Acetylchol Block Ab: 22 % (ref 0–25)

## 2016-10-11 LAB — CK: Total CK: 203 U/L — ABNORMAL HIGH (ref 24–173)

## 2016-10-11 NOTE — Telephone Encounter (Signed)
Urvi with [facility] called to receive verbal orders for PT reading 2 times for 3 weeks and 1 time for 2 weeks.  Please call

## 2016-10-13 DIAGNOSIS — Z85038 Personal history of other malignant neoplasm of large intestine: Secondary | ICD-10-CM | POA: Diagnosis not present

## 2016-10-13 DIAGNOSIS — E119 Type 2 diabetes mellitus without complications: Secondary | ICD-10-CM | POA: Diagnosis not present

## 2016-10-13 DIAGNOSIS — M1990 Unspecified osteoarthritis, unspecified site: Secondary | ICD-10-CM | POA: Diagnosis not present

## 2016-10-13 DIAGNOSIS — R270 Ataxia, unspecified: Secondary | ICD-10-CM | POA: Diagnosis not present

## 2016-10-13 NOTE — Telephone Encounter (Signed)
Returned call w/ VO for PT 2x/wk x 3 wks, then 1x/wk x 2 wks.

## 2016-10-14 ENCOUNTER — Ambulatory Visit
Admission: RE | Admit: 2016-10-14 | Discharge: 2016-10-14 | Disposition: A | Discharge status: Home / Self Care | Payer: Medicare Other | Source: Ambulatory Visit | Attending: Neurology | Admitting: Neurology

## 2016-10-14 DIAGNOSIS — R270 Ataxia, unspecified: Secondary | ICD-10-CM | POA: Diagnosis not present

## 2016-10-14 DIAGNOSIS — R531 Weakness: Secondary | ICD-10-CM

## 2016-10-14 DIAGNOSIS — R260 Ataxic gait: Secondary | ICD-10-CM | POA: Diagnosis not present

## 2016-10-14 DIAGNOSIS — R269 Unspecified abnormalities of gait and mobility: Secondary | ICD-10-CM

## 2016-10-14 DIAGNOSIS — W19XXXA Unspecified fall, initial encounter: Secondary | ICD-10-CM

## 2016-10-14 DIAGNOSIS — M4802 Spinal stenosis, cervical region: Secondary | ICD-10-CM | POA: Diagnosis not present

## 2016-10-14 DIAGNOSIS — W19XXXD Unspecified fall, subsequent encounter: Secondary | ICD-10-CM

## 2016-10-14 DIAGNOSIS — G629 Polyneuropathy, unspecified: Secondary | ICD-10-CM

## 2016-10-15 ENCOUNTER — Other Ambulatory Visit: Payer: Self-pay | Admitting: Neurology

## 2016-10-15 ENCOUNTER — Telehealth: Payer: Self-pay | Admitting: Neurology

## 2016-10-15 DIAGNOSIS — M4802 Spinal stenosis, cervical region: Secondary | ICD-10-CM

## 2016-10-15 DIAGNOSIS — R768 Other specified abnormal immunological findings in serum: Secondary | ICD-10-CM

## 2016-10-15 DIAGNOSIS — D472 Monoclonal gammopathy: Secondary | ICD-10-CM

## 2016-10-15 NOTE — Telephone Encounter (Signed)
Called patient regarding labs and MRI cervical spine.  IFE showed IgM monoclonal protein with kappa light chain specificity. Needs referral to hematology.   CK slightly elevated may not be clinically relevant  HgbA1c 6.5   ANA + and DSDNA positive maybe a false positive need ANA titer and IFA  MRI cervical spine shows moderate cervical stenosis unclear if this is symptomatic.   After emg/ncs may refer to [facility] for evaluation.

## 2016-10-18 DIAGNOSIS — R270 Ataxia, unspecified: Secondary | ICD-10-CM | POA: Diagnosis not present

## 2016-10-18 DIAGNOSIS — E119 Type 2 diabetes mellitus without complications: Secondary | ICD-10-CM | POA: Diagnosis not present

## 2016-10-18 DIAGNOSIS — Z85038 Personal history of other malignant neoplasm of large intestine: Secondary | ICD-10-CM | POA: Diagnosis not present

## 2016-10-18 DIAGNOSIS — M1990 Unspecified osteoarthritis, unspecified site: Secondary | ICD-10-CM | POA: Diagnosis not present

## 2016-10-20 DIAGNOSIS — Z85038 Personal history of other malignant neoplasm of large intestine: Secondary | ICD-10-CM | POA: Diagnosis not present

## 2016-10-20 DIAGNOSIS — M1990 Unspecified osteoarthritis, unspecified site: Secondary | ICD-10-CM | POA: Diagnosis not present

## 2016-10-20 DIAGNOSIS — R270 Ataxia, unspecified: Secondary | ICD-10-CM | POA: Diagnosis not present

## 2016-10-20 DIAGNOSIS — E119 Type 2 diabetes mellitus without complications: Secondary | ICD-10-CM | POA: Diagnosis not present

## 2016-10-24 ENCOUNTER — Telehealth: Payer: Self-pay | Admitting: Neurology

## 2016-10-24 DIAGNOSIS — E119 Type 2 diabetes mellitus without complications: Secondary | ICD-10-CM | POA: Diagnosis not present

## 2016-10-24 DIAGNOSIS — R270 Ataxia, unspecified: Secondary | ICD-10-CM | POA: Diagnosis not present

## 2016-10-24 DIAGNOSIS — M1990 Unspecified osteoarthritis, unspecified site: Secondary | ICD-10-CM | POA: Diagnosis not present

## 2016-10-24 DIAGNOSIS — Z85038 Personal history of other malignant neoplasm of large intestine: Secondary | ICD-10-CM | POA: Diagnosis not present

## 2016-10-24 NOTE — Telephone Encounter (Signed)
Dot Lanes Physical Therapist with [facility] called office in reference to patient.  Dot Lanes states yesterday patient felt drunk, unsteady, having to hold on to surfaces to get around.  Dot Lanes said she is working on balance with patient, but feels its not really improving.  Patient advised Dot Lanes she had labs done with out office and has been referred to Oncology has not received a call yet for an appointment.  Per Urvi patient states a Neurosurgery office called patient and asked her if she wanted to do an injection or surgery leaving patient confused.  Please call

## 2016-10-25 ENCOUNTER — Encounter: Payer: Self-pay | Admitting: Hematology

## 2016-10-25 ENCOUNTER — Telehealth: Payer: Self-pay | Admitting: Hematology

## 2016-10-25 NOTE — Telephone Encounter (Signed)
Delsa Sale, I wanted patient evaluated for her cervical stenosis. Can yo find out what was said to her? She should have evaluation for surgical options as well as shots. Maybe we can then call [place] [facility] and see where the disconnect is. Let me know, she should have an appy with [place].

## 2016-10-25 NOTE — Telephone Encounter (Signed)
Pt cld to schedule appt to see Dr. Irene Limbo on 4/24 at 1pm. Pt aware to arrive 30 minutes early. Demographics verified. Letter mailed.

## 2016-10-25 NOTE — Telephone Encounter (Signed)
I called the [facility] to make sure they had received the referral. I called the patient to let her know the referral had been sent over. I gave her the number to [facility] so she could call and schedule.

## 2016-10-26 DIAGNOSIS — M1990 Unspecified osteoarthritis, unspecified site: Secondary | ICD-10-CM | POA: Diagnosis not present

## 2016-10-26 DIAGNOSIS — R270 Ataxia, unspecified: Secondary | ICD-10-CM | POA: Diagnosis not present

## 2016-10-26 DIAGNOSIS — D519 Vitamin B12 deficiency anemia, unspecified: Secondary | ICD-10-CM | POA: Diagnosis not present

## 2016-10-26 DIAGNOSIS — H81393 Other peripheral vertigo, bilateral: Secondary | ICD-10-CM | POA: Diagnosis not present

## 2016-10-26 DIAGNOSIS — Z23 Encounter for immunization: Secondary | ICD-10-CM | POA: Diagnosis not present

## 2016-10-26 DIAGNOSIS — J0100 Acute maxillary sinusitis, unspecified: Secondary | ICD-10-CM | POA: Diagnosis not present

## 2016-10-26 DIAGNOSIS — E119 Type 2 diabetes mellitus without complications: Secondary | ICD-10-CM | POA: Diagnosis not present

## 2016-10-26 DIAGNOSIS — Z85038 Personal history of other malignant neoplasm of large intestine: Secondary | ICD-10-CM | POA: Diagnosis not present

## 2016-11-01 DIAGNOSIS — M1990 Unspecified osteoarthritis, unspecified site: Secondary | ICD-10-CM | POA: Diagnosis not present

## 2016-11-01 DIAGNOSIS — E119 Type 2 diabetes mellitus without complications: Secondary | ICD-10-CM | POA: Diagnosis not present

## 2016-11-01 DIAGNOSIS — R270 Ataxia, unspecified: Secondary | ICD-10-CM | POA: Diagnosis not present

## 2016-11-01 DIAGNOSIS — Z85038 Personal history of other malignant neoplasm of large intestine: Secondary | ICD-10-CM | POA: Diagnosis not present

## 2016-11-02 DIAGNOSIS — I10 Essential (primary) hypertension: Secondary | ICD-10-CM | POA: Diagnosis not present

## 2016-11-02 DIAGNOSIS — R2689 Other abnormalities of gait and mobility: Secondary | ICD-10-CM | POA: Diagnosis not present

## 2016-11-02 DIAGNOSIS — M4802 Spinal stenosis, cervical region: Secondary | ICD-10-CM | POA: Diagnosis not present

## 2016-11-03 ENCOUNTER — Telehealth: Payer: Self-pay

## 2016-11-03 NOTE — Telephone Encounter (Signed)
Received faxed report from [facility]. Pt d/c to home or self care 11/01/16.

## 2016-11-11 DIAGNOSIS — E1165 Type 2 diabetes mellitus with hyperglycemia: Secondary | ICD-10-CM | POA: Diagnosis not present

## 2016-11-11 DIAGNOSIS — K219 Gastro-esophageal reflux disease without esophagitis: Secondary | ICD-10-CM | POA: Diagnosis not present

## 2016-11-11 DIAGNOSIS — E782 Mixed hyperlipidemia: Secondary | ICD-10-CM | POA: Diagnosis not present

## 2016-11-11 DIAGNOSIS — E1122 Type 2 diabetes mellitus with diabetic chronic kidney disease: Secondary | ICD-10-CM | POA: Diagnosis not present

## 2016-11-14 ENCOUNTER — Other Ambulatory Visit: Payer: Self-pay | Admitting: Family Medicine

## 2016-11-14 DIAGNOSIS — Z1231 Encounter for screening mammogram for malignant neoplasm of breast: Secondary | ICD-10-CM

## 2016-11-15 DIAGNOSIS — Z0001 Encounter for general adult medical examination with abnormal findings: Secondary | ICD-10-CM | POA: Diagnosis not present

## 2016-11-15 DIAGNOSIS — E1122 Type 2 diabetes mellitus with diabetic chronic kidney disease: Secondary | ICD-10-CM | POA: Diagnosis not present

## 2016-11-15 DIAGNOSIS — I10 Essential (primary) hypertension: Secondary | ICD-10-CM | POA: Diagnosis not present

## 2016-11-15 DIAGNOSIS — E782 Mixed hyperlipidemia: Secondary | ICD-10-CM | POA: Diagnosis not present

## 2016-11-15 DIAGNOSIS — N183 Chronic kidney disease, stage 3 (moderate): Secondary | ICD-10-CM | POA: Diagnosis not present

## 2016-11-24 ENCOUNTER — Ambulatory Visit (INDEPENDENT_AMBULATORY_CARE_PROVIDER_SITE_OTHER): Payer: Medicare Other | Admitting: Neurology

## 2016-11-24 ENCOUNTER — Ambulatory Visit (INDEPENDENT_AMBULATORY_CARE_PROVIDER_SITE_OTHER): Payer: Self-pay | Admitting: Neurology

## 2016-11-24 DIAGNOSIS — R270 Ataxia, unspecified: Secondary | ICD-10-CM

## 2016-11-24 DIAGNOSIS — R768 Other specified abnormal immunological findings in serum: Secondary | ICD-10-CM

## 2016-11-24 DIAGNOSIS — R531 Weakness: Secondary | ICD-10-CM

## 2016-11-24 DIAGNOSIS — Z0289 Encounter for other administrative examinations: Secondary | ICD-10-CM

## 2016-11-24 NOTE — Progress Notes (Signed)
See procedure note.

## 2016-11-24 NOTE — Procedures (Signed)
Full Name: Carolyn [name] Gender: Female MRN #: [id number] Date of Birth: 20-May-1941    Visit Date: 11/24/2016 11:41 Age: 76 Years 2 Months Old Examining Physician: Sarina Ill, MD  Referring Physician: Gar Ponto, MD    History:  76 year old with weakness of the legs, ataxia, falls  Summary: All nerves and muscles (as indicated in the following tables) were within normal limits.    Conclusion: This is a normal study of upper and lower extremities. No suggestion of neuropathy, radiculopathy or myopathy.  Cc: Gar Ponto, MD  [name], M.D.  [facility] [place], [place] [address] Tel: [phone] Fax: [phone]        [facility]    Nerve / Sites Rec. Site Latency Ref. Amplitude Ref. Rel Amp Segments Distance Velocity Ref. Area    ms ms mV mV %  cm m/s m/s mVms  R Median - APB     Wrist APB 4.4 ?4.4 7.0 ?4.0 100 Wrist - APB 7   24.5     Upper arm APB 8.9  5.0  71.1 Upper arm - Wrist 22 49 ?49 17.7  R Ulnar - ADM     Wrist ADM 3.2 ?3.3 5.3 ?6.0 100 Wrist - ADM 7   16.4     B.Elbow ADM 6.8  4.3  82.1 B.Elbow - Wrist 18 49 ?49 13.9     A.Elbow ADM 8.9  4.6  106 A.Elbow - B.Elbow 10 49 ?49 14.8         A.Elbow - Wrist      L Peroneal - EDB     Ankle EDB 4.7 ?6.5 4.8 ?2.0 100 Ankle - EDB 9   14.0     Fib head EDB 10.9  4.1  84.6 Fib head - Ankle 28 45 ?44 13.4     Pop fossa EDB 12.7  4.1  101 Pop fossa - Fib head 10 56 ?44 13.8         Pop fossa - Ankle      L Tibial - AH     Ankle AH 4.3 ?5.8 10.1 ?4.0 100 Ankle - AH 9   26.0     Pop fossa AH 13.0  4.8  48.1 Pop fossa - Ankle 36 41 ?41 16.8             SNC    Nerve / Sites Rec. Site Peak Lat Ref.  Amp Ref. Segments Distance    ms ms V V  cm  L Sural - Ankle (Calf)     Calf Ankle 3.44 ?4.40 4 ?6 Calf - Ankle 14  L Superficial peroneal - Ankle     Lat leg Ankle 3.96 ?4.40 4 ?6 Lat leg - Ankle 14  R Median - Orthodromic (Dig II, Mid palm)     Dig II Wrist 3.39 ?3.40 12 ?10 Dig II  - Wrist 13  R Ulnar - Orthodromic, (Dig V, Mid palm)     Dig V Wrist 2.76 ?3.10 5 ?5 Dig V - Wrist 80             F  Wave    Nerve F Lat Ref.   ms ms  L Tibial - AH 55.2 ?56.0  R Ulnar - ADM 31.8 ?32.0         EMG full       EMG Summary Table    Spontaneous MUAP Recruitment  Muscle IA Fib PSW Fasc  Other Amp Dur. Poly Pattern  L. Deltoid Normal None None None _______ Normal Normal Normal Normal  L. Triceps brachii Normal None None None _______ Normal Normal Normal Normal  L. Biceps brachii Normal None None None _______ Normal Normal Normal Normal  L. Pronator teres Normal None None None _______ Normal Normal Normal Normal  L. First dorsal interosseous Normal None None None _______ Normal Normal Normal Normal  L. Iliopsoas Normal None None None _______ Normal Normal Normal Normal  L. Vastus lateralis Normal None None None _______ Normal Normal Normal Normal  L. Vastus medialis Normal None None None _______ Normal Normal Normal Normal  L. Tibialis anterior Normal None None None _______ Normal Normal Normal Normal  L. Extensor hallucis longus Normal None None None _______ Normal Normal Normal Normal  L. Abductor hallucis Normal None None None _______ Normal Normal Normal Normal

## 2016-11-24 NOTE — Progress Notes (Signed)
Full Name: Carolyn [name] Gender: Female MRN #: [id number] Date of Birth: [birth year]    Visit Date: 11/24/2016 11:41 Age: 76 Years 76 Months Old Examining Physician: Sarina Ill, MD  Referring Physician: Gar Ponto, MD    History:  76 year old with weakness of the legs, ataxia, falls  Summary: All nerves and muscles (as indicated in the following tables) were within normal limits.    Conclusion: This is a normal study of upper and lower extremities. No suggestion of neuropathy, radiculopathy or myopathy.  Cc: Gar Ponto, MD  [name], M.D.  [facility] [place],  [address] Tel: [phone] Fax: [phone]        [facility]    Nerve / Sites Rec. Site Latency Ref. Amplitude Ref. Rel Amp Segments Distance Velocity Ref. Area    ms ms mV mV %  cm m/s m/s mVms  R Median - APB     Wrist APB 4.4 ?4.4 7.0 ?4.0 100 Wrist - APB 7   24.5     Upper arm APB 8.9  5.0  71.1 Upper arm - Wrist 22 49 ?49 17.7  R Ulnar - ADM     Wrist ADM 3.2 ?3.3 5.3 ?6.0 100 Wrist - ADM 7   16.4     B.Elbow ADM 6.8  4.3  82.1 B.Elbow - Wrist 18 49 ?49 13.9     A.Elbow ADM 8.9  4.6  106 A.Elbow - B.Elbow 10 49 ?49 14.8         A.Elbow - Wrist      L Peroneal - EDB     Ankle EDB 4.7 ?6.5 4.8 ?2.0 100 Ankle - EDB 9   14.0     Fib head EDB 10.9  4.1  84.6 Fib head - Ankle 28 45 ?44 13.4     Pop fossa EDB 12.7  4.1  101 Pop fossa - Fib head 10 56 ?44 13.8         Pop fossa - Ankle      L Tibial - AH     Ankle AH 4.3 ?5.8 10.1 ?4.0 100 Ankle - AH 9   26.0     Pop fossa AH 13.0  4.8  48.1 Pop fossa - Ankle 36 41 ?41 16.8             SNC    Nerve / Sites Rec. Site Peak Lat Ref.  Amp Ref. Segments Distance    ms ms V V  cm  L Sural - Ankle (Calf)     Calf Ankle 3.44 ?4.40 4 ?6 Calf - Ankle 14  L Superficial peroneal - Ankle     Lat leg Ankle 3.96 ?4.40 4 ?6 Lat leg - Ankle 14  R Median - Orthodromic (Dig II, Mid palm)     Dig II Wrist 3.39 ?3.40 12 ?10 Dig II  - Wrist 13  R Ulnar - Orthodromic, (Dig V, Mid palm)     Dig V Wrist 2.76 ?3.10 5 ?5 Dig V - Wrist 27             F  Wave    Nerve F Lat Ref.   ms ms  L Tibial - AH 55.2 ?56.0  R Ulnar - ADM 31.8 ?32.0         EMG full       EMG Summary Table    Spontaneous MUAP Recruitment  Muscle IA Fib PSW Fasc  Other Amp Dur. Poly Pattern  L. Deltoid Normal None None None _______ Normal Normal Normal Normal  L. Triceps brachii Normal None None None _______ Normal Normal Normal Normal  L. Biceps brachii Normal None None None _______ Normal Normal Normal Normal  L. Pronator teres Normal None None None _______ Normal Normal Normal Normal  L. First dorsal interosseous Normal None None None _______ Normal Normal Normal Normal  L. Iliopsoas Normal None None None _______ Normal Normal Normal Normal  L. Vastus lateralis Normal None None None _______ Normal Normal Normal Normal  L. Vastus medialis Normal None None None _______ Normal Normal Normal Normal  L. Tibialis anterior Normal None None None _______ Normal Normal Normal Normal  L. Extensor hallucis longus Normal None None None _______ Normal Normal Normal Normal  L. Abductor hallucis Normal None None None _______ Normal Normal Normal Normal

## 2016-11-25 DIAGNOSIS — M1712 Unilateral primary osteoarthritis, left knee: Secondary | ICD-10-CM | POA: Diagnosis not present

## 2016-11-29 ENCOUNTER — Ambulatory Visit (HOSPITAL_BASED_OUTPATIENT_CLINIC_OR_DEPARTMENT_OTHER): Payer: Medicare Other | Admitting: Hematology

## 2016-11-29 ENCOUNTER — Ambulatory Visit (HOSPITAL_BASED_OUTPATIENT_CLINIC_OR_DEPARTMENT_OTHER): Payer: Medicare Other

## 2016-11-29 ENCOUNTER — Telehealth: Payer: Self-pay | Admitting: Hematology

## 2016-11-29 ENCOUNTER — Encounter: Payer: Self-pay | Admitting: Hematology

## 2016-11-29 DIAGNOSIS — D472 Monoclonal gammopathy: Secondary | ICD-10-CM | POA: Diagnosis not present

## 2016-11-29 DIAGNOSIS — I10 Essential (primary) hypertension: Secondary | ICD-10-CM

## 2016-11-29 LAB — CBC & DIFF AND RETIC
BASO%: 0.4 % (ref 0.0–2.0)
Basophils Absolute: 0 10*3/uL (ref 0.0–0.1)
EOS%: 2.7 % (ref 0.0–7.0)
Eosinophils Absolute: 0.1 10*3/uL (ref 0.0–0.5)
HCT: 38.7 % (ref 34.8–46.6)
HGB: 12.9 g/dL (ref 11.6–15.9)
Immature Retic Fract: 4.5 % (ref 1.60–10.00)
LYMPH%: 29.8 % (ref 14.0–49.7)
MCH: 29.5 pg (ref 25.1–34.0)
MCHC: 33.3 g/dL (ref 31.5–36.0)
MCV: 88.4 fL (ref 79.5–101.0)
MONO#: 0.4 10*3/uL (ref 0.1–0.9)
MONO%: 8.5 % (ref 0.0–14.0)
NEUT#: 2.6 10*3/uL (ref 1.5–6.5)
NEUT%: 58.6 % (ref 38.4–76.8)
Platelets: 273 10*3/uL (ref 145–400)
RBC: 4.38 10*6/uL (ref 3.70–5.45)
RDW: 12.8 % (ref 11.2–14.5)
Retic %: 1.16 % (ref 0.70–2.10)
Retic Ct Abs: 50.81 10*3/uL (ref 33.70–90.70)
WBC: 4.5 10*3/uL (ref 3.9–10.3)
lymph#: 1.3 10*3/uL (ref 0.9–3.3)

## 2016-11-29 LAB — COMPREHENSIVE METABOLIC PANEL
ALT: 19 U/L (ref 0–55)
AST: 22 U/L (ref 5–34)
Albumin: 4.2 g/dL (ref 3.5–5.0)
Alkaline Phosphatase: 78 U/L (ref 40–150)
Anion Gap: 9 mEq/L (ref 3–11)
BUN: 18.3 mg/dL (ref 7.0–26.0)
CO2: 27 mEq/L (ref 22–29)
Calcium: 11.3 mg/dL — ABNORMAL HIGH (ref 8.4–10.4)
Chloride: 100 mEq/L (ref 98–109)
Creatinine: 1.1 mg/dL (ref 0.6–1.1)
EGFR: 49 mL/min/{1.73_m2} — ABNORMAL LOW (ref >90)
Glucose: 118 mg/dl (ref 70–140)
Potassium: 4 mEq/L (ref 3.5–5.1)
Sodium: 137 mEq/L (ref 136–145)
Total Bilirubin: 0.38 mg/dL (ref 0.20–1.20)
Total Protein: 7.2 g/dL (ref 6.4–8.3)

## 2016-11-29 LAB — FANA STAINING PATTERNS: Speckled Pattern: 1:80 {titer}

## 2016-11-29 LAB — ANTINUCLEAR ANTIBODIES, IFA: ANA Titer 1: POSITIVE — AB

## 2016-11-29 LAB — LACTATE DEHYDROGENASE: LDH: 163 U/L (ref 125–245)

## 2016-11-29 NOTE — Patient Instructions (Signed)
Thank you for choosing [facility] to provide your oncology and hematology care.  To afford each patient quality time with our providers, please arrive 30 minutes before your scheduled appointment time.  If you arrive late for your appointment, you may be asked to reschedule.  We strive to give you quality time with our providers, and arriving late affects you and other patients whose appointments are after yours.  If you are a no show for multiple scheduled visits, you may be dismissed from the clinic at the providers discretion.   Again, thank you for choosing [facility], our hope is that these requests will decrease the amount of time that you wait before being seen by our physicians.  ______________________________________________________________________ Should you have questions after your visit to the [facility], please contact our office at [phone] between the hours of 8:30 and 4:30 p.m.    Voicemails left after 4:30p.m will not be returned until the following business day.   For prescription refill requests, please have your pharmacy contact us directly.  Please also try to allow 48 hours for prescription requests.   Please contact the scheduling department for questions regarding scheduling.  For scheduling of procedures such as PET scans, CT scans, MRI, Ultrasound, etc please contact central scheduling at [phone].   Resources For Cancer Patients and Caregivers:  American Cancer Society:  800-227-2345  Can help patients locate various types of support and financial assistance Cancer Care: 1-800-813-HOPE (4673) Provides financial assistance, online support groups, medication/co-pay assistance.   [place] DSS:  336-641-3447 Where to apply for food stamps, Medicaid, and utility assistance Medicare Rights Center: 800-333-4114 Helps people with Medicare understand their rights and benefits, navigate the Medicare system, and secure the  quality healthcare they deserve SCAT: 336-333-6589 [place] [employer]'s shared-ride transportation service for eligible riders who have a disability that prevents them from riding the fixed route bus.   For additional information on assistance programs please contact our social worker:   Grier Hock/Abigail Elmore:  [phone] 

## 2016-11-29 NOTE — Telephone Encounter (Signed)
Appointments scheduled per 11/29/16 los. Labs added for today. Patient was given a copy of the AVS report and appointment schedule per 11/29/16 los.

## 2016-11-30 ENCOUNTER — Telehealth: Payer: Self-pay | Admitting: *Deleted

## 2016-11-30 LAB — BETA 2 MICROGLOBULIN, SERUM: Beta-2: 1.9 mg/L (ref 0.6–2.4)

## 2016-11-30 LAB — KAPPA/LAMBDA LIGHT CHAINS
Ig Kappa Free Light Chain: 45 mg/L — ABNORMAL HIGH (ref 3.3–19.4)
Ig Lambda Free Light Chain: 11.1 mg/L (ref 5.7–26.3)
Kappa/Lambda FluidC Ratio: 4.05 — ABNORMAL HIGH (ref 0.26–1.65)

## 2016-11-30 NOTE — Progress Notes (Signed)
Marland Kitchen    HEMATOLOGY/ONCOLOGY CONSULTATION NOTE  Date of Service: 11/30/2016  Patient Care Team: Caryl Bis, MD as PCP - General (Unknown Physician Specialty) Sarina Ill MD (Neuropathy)  CHIEF COMPLAINTS/PURPOSE OF CONSULTATION:   IgM monoclonal gammopathy  HISTORY OF PRESENTING ILLNESS:   Carolyn [name] is a wonderful 76 y.o. female who has been referred to [place] by Dr Jaynee Eagles MD for evaluation and management of IgM monoclonal paraproteinemia.  Patient is a history of hypertension, diabetes, dyslipidemia, fatty liver, irritable bowel syndrome who has had issues with fatigue, balance issues and a sense of disequilibrium since about September/October 2017. She was seen by Dr. Jaynee Eagles MD and has had an extensive neurologic workup without an overt unifying etiology. She was noted to be ANA and double stranded DNA positive and the significance of this was unknown. She has had an ENT evaluation. Some concerns for parkinsonian features but clinical examination showed no tremors or overt increase in tone. Concerns for parkinsonian-like features from white matter disease. Patient notes no overt neuropathy like symptoms. Notes that she had an EMG/nerve conduction study on 11/24/2016 and this did not show any suggestion of neuropathy radiculopathy or myopathy. Patient has had joint issues which could certainly be an additional contribution factor. On clinic visit on examination today she was noted to have or sensory ataxia.  As part of her neurologic workup she had an SPEP with quantitative immunoglobulins which showed a significant increase in her IgM to 565 with an M spike of 0.4 g/dL which on IFE was noted to be IgM monoclonal protein with kappa light chain specificity.  Patient notes no new obvious new focal bone pains. She does report that her fatigue is clearly new and is limiting how she functions at home. No fevers no chills no overt night sweats. Has lost about 8-10 pounds since December  2018.  No skin rashes.  MEDICAL HISTORY:  Past Medical History:  Diagnosis Date  . Anxiety   . Arthritis   . Depression   . Diabetes mellitus ([place])   . GERD (gastroesophageal reflux disease)   . Hepatic steatosis   . Hyperlipidemia   . Hypertension   . IBS (irritable bowel syndrome)   . Internal hemorrhoid   . Tubular adenoma of colon 1999   colonoscopy    SURGICAL HISTORY: Past Surgical History:  Procedure Laterality Date  . ANKLE FRACTURE SURGERY Left   . BACK SURGERY  04/2011  . VAGINAL HYSTERECTOMY  1964    SOCIAL HISTORY: Social History   Social History  . Marital status: Widowed    Spouse name: N/A  . Number of children: 3  . Years of education: 10   Occupational History  . retired    Social History Main Topics  . Smoking status: Never Smoker  . Smokeless tobacco: Never Used  . Alcohol use No  . Drug use: No  . Sexual activity: Not on file   Other Topics Concern  . Not on file   Social History Narrative   Lives alone   Daily caffeine   Left-handed       FAMILY HISTORY: Family History  Problem Relation Age of Onset  . Stomach cancer Sister   . Diabetes Sister   . Colon polyps Sister     hyperplastic  . High blood pressure Sister   . Colon cancer Neg Hx     ALLERGIES:  is allergic to sulfa antibiotics.  MEDICATIONS:  Current Outpatient Prescriptions  Medication Sig Dispense Refill  .  CELEBREX 200 MG capsule Take 200 mg by mouth daily.     . citalopram (CELEXA) 20 MG tablet Take 20 mg by mouth daily.     . Glucosamine 500 MG CAPS Take by mouth daily.    Marland Kitchen loratadine (ALLERGY) 10 MG tablet Take 10 mg by mouth daily.    Marland Kitchen losartan-hydrochlorothiazide (HYZAAR) 100-12.5 MG per tablet Take 1 tablet by mouth daily.     . Melatonin 3 MG CAPS Take 1 capsule by mouth daily.    . metFORMIN (GLUCOPHAGE-XR) 500 MG 24 hr tablet Take 500-1,000 mg by mouth 3 (three) times daily. Take 2 tablets in the morning, and 1 tablet in the evening.    Marland Kitchen  omeprazole (PRILOSEC) 20 MG capsule Take 20 mg by mouth daily.      No current facility-administered medications for this visit.     REVIEW OF SYSTEMS:    10 Point review of Systems was done is negative except as noted above.  PHYSICAL EXAMINATION: ECOG PERFORMANCE STATUS: 1 - Symptomatic but completely ambulatory VS reviewed in EPIC GENERAL:alert, in no acute distress and comfortable SKIN: no acute rashes, no significant lesions EYES: conjunctiva are pink and non-injected, sclera anicteric OROPHARYNX: MMM, no exudates, no oropharyngeal erythema or ulceration NECK: supple, no JVD LYMPH:  no palpable lymphadenopathy in the cervical, axillary or inguinal regions LUNGS: clear to auscultation b/l with normal respiratory effort HEART: regular rate & rhythm ABDOMEN:  normoactive bowel sounds , non tender, not distended. No palpable hepatosplenomegaly. Extremity: no pedal edema PSYCH: alert & oriented x 3 with fluent speech NEURO: No overt focal motor weakness, noted to have nystagmus, overt sensory ataxia with positive Romberg's.  LABORATORY DATA:  I have reviewed the data as listed  . CBC Latest Ref Rng & Units 11/29/2016 05/25/2016 12/23/2014  WBC 3.9 - 10.3 10e3/uL 4.5 4.2 6.8  Hemoglobin 11.6 - 15.9 g/dL 12.9 12.7 12.8  Hematocrit 34.8 - 46.6 % 38.7 38.5 38.9  Platelets 145 - 400 10e3/uL 273 263 261    . CMP Latest Ref Rng & Units 11/29/2016 10/04/2016 05/25/2016  Glucose 70 - 140 mg/dl 118 - 132(H)  BUN 7.0 - 26.0 mg/dL 18.3 - 14  Creatinine 0.6 - 1.1 mg/dL 1.1 - 0.90  Sodium 136 - 145 mEq/L 137 - 134(L)  Potassium 3.5 - 5.1 mEq/L 4.0 - 4.0  Chloride 101 - 111 mmol/L - - 100(L)  CO2 22 - 29 mEq/L 27 - 26  Calcium 8.4 - 10.4 mg/dL 11.3(H) - 10.6(H)  Total Protein 6.4 - 8.3 g/dL 7.2 7.0 -  Total Bilirubin 0.20 - 1.20 mg/dL 0.38 - -  Alkaline Phos 40 - 150 U/L 78 - -  AST 5 - 34 U/L 22 - -  ALT 0 - 55 U/L 19 - -   . Lab Results  Component Value Date   LDH 163 11/29/2016       RADIOGRAPHIC STUDIES: I have personally reviewed the radiological images as listed and agreed with the findings in the report. No results found.  ASSESSMENT & PLAN:   76 yo caucasian female with fatigue, weight loss, dysequilibrium and balance issues with   1) IgM kappa Monoclonal gammopathy of underdetermined significant IgM level fairly elevated at 565  M spike of 0.4g/dl No anemia. No thrombocytopenia. No overt palpable lymphadenopathy or splenomegaly. LDH level is within normal limits.  Her fatigue, weight loss and elevated calcium levels are concerning to rule out lymphoplasmacytic lymphoma.  2) hypercalcemia with a calcium level of  11.3. Unclear etiology. Rule out lymphoma. Cannot rule out the possibility that this could also be from her hydrochlorothiazide. Plan -We'll repeat myeloma panel. -We should also get a serum free light chains, LDH, beta-2 microglobulin. -We'll get a PET CT scan to rule out any evidence of bone lesions, lymphoma, bone marrow involvement. -Based on lab results and PET/CT scans will decide on need for bone marrow biopsy/lymph node biopsy if present vs clinical observation in 3-4 months with repeat labs. -Patient was encouraged to drink a lot of water -She was strongly recommended to use a cane and all times to reduce her risk of falls. -Continue follow-up with primary care physician and neurology for other continued cares  3) . Patient Active Problem List   Diagnosis Date Noted  . Falls 07/19/2016  . Precordial chest pain 12/23/2014  . Hypercalcemia 12/23/2014  . Diabetes ([place]) 10/23/2013  . HTN (hypertension) 10/23/2013   Plan -mx as per PCP.  Labs today PET/CT in 1 week RTC with Dr Irene Limbo in 4 months with rpt labs, earlier based on labs and PET/CT results  All of the patients questions were answered with apparent satisfaction. The patient knows to call the clinic with any problems, questions or concerns.  I spent 45 minutes  counseling the patient face to face. The total time spent in the appointment was 60 minutes and more than 50% was on counseling and direct patient cares.    Sullivan Lone MD [place] AAHIVMS [facility] Hematology/Oncology Physician [facility]  (Office):       [phone] (Work cell):  [phone] (Fax):           [phone]  11/30/2016 12:52 AM

## 2016-11-30 NOTE — Telephone Encounter (Signed)
Per Dr. Irene Limbo, spoke with pt's daughter to inform of elevated hypercalcemia and order for PET/CT scan.  Daughter aware/verbalized understanding and will inform Mrs. Suto.

## 2016-12-01 ENCOUNTER — Telehealth: Payer: Self-pay | Admitting: *Deleted

## 2016-12-01 NOTE — Telephone Encounter (Signed)
Per Dr Jaynee Eagles, spoke with patient and advised her that her lab ANA titer was very low. Advised Dr Jaynee Eagles is not concerned that she has autoimmune disease. Advised patient that is good news. She verbalized understanding, appreciation.

## 2016-12-02 ENCOUNTER — Ambulatory Visit
Admission: RE | Admit: 2016-12-02 | Discharge: 2016-12-02 | Disposition: A | Discharge status: Home / Self Care | Payer: Medicare Other | Source: Ambulatory Visit | Attending: Family Medicine | Admitting: Family Medicine

## 2016-12-02 DIAGNOSIS — Z1231 Encounter for screening mammogram for malignant neoplasm of breast: Secondary | ICD-10-CM | POA: Diagnosis not present

## 2016-12-05 LAB — MULTIPLE MYELOMA PANEL, SERUM
Albumin SerPl Elph-Mcnc: 4.1 g/dL (ref 2.9–4.4)
Albumin/Glob SerPl: 1.5 (ref 0.7–1.7)
Alpha 1: 0.2 g/dL (ref 0.0–0.4)
Alpha2 Glob SerPl Elph-Mcnc: 0.6 g/dL (ref 0.4–1.0)
B-Globulin SerPl Elph-Mcnc: 1.4 g/dL — ABNORMAL HIGH (ref 0.7–1.3)
Gamma Glob SerPl Elph-Mcnc: 0.7 g/dL (ref 0.4–1.8)
Globulin, Total: 2.8 g/dL (ref 2.2–3.9)
IgA, Qn, Serum: 93 mg/dL (ref 64–422)
IgG, Qn, Serum: 723 mg/dL (ref 700–1600)
IgM, Qn, Serum: 505 mg/dL — ABNORMAL HIGH (ref 26–217)
M Protein SerPl Elph-Mcnc: 0.4 g/dL — ABNORMAL HIGH
Total Protein: 6.9 g/dL (ref 6.0–8.5)

## 2016-12-06 DIAGNOSIS — M25562 Pain in left knee: Secondary | ICD-10-CM | POA: Diagnosis not present

## 2016-12-06 DIAGNOSIS — M170 Bilateral primary osteoarthritis of knee: Secondary | ICD-10-CM | POA: Diagnosis not present

## 2016-12-06 DIAGNOSIS — M25561 Pain in right knee: Secondary | ICD-10-CM | POA: Diagnosis not present

## 2016-12-06 DIAGNOSIS — G8929 Other chronic pain: Secondary | ICD-10-CM | POA: Diagnosis not present

## 2016-12-08 DIAGNOSIS — M19042 Primary osteoarthritis, left hand: Secondary | ICD-10-CM | POA: Diagnosis not present

## 2016-12-08 DIAGNOSIS — M25542 Pain in joints of left hand: Secondary | ICD-10-CM | POA: Diagnosis not present

## 2016-12-08 DIAGNOSIS — D519 Vitamin B12 deficiency anemia, unspecified: Secondary | ICD-10-CM | POA: Diagnosis not present

## 2016-12-12 ENCOUNTER — Ambulatory Visit (HOSPITAL_COMMUNITY)
Admission: RE | Admit: 2016-12-12 | Discharge: 2016-12-12 | Disposition: A | Discharge status: Home / Self Care | Payer: Medicare Other | Source: Ambulatory Visit | Attending: Hematology | Admitting: Hematology

## 2016-12-12 DIAGNOSIS — I700 Atherosclerosis of aorta: Secondary | ICD-10-CM | POA: Insufficient documentation

## 2016-12-12 DIAGNOSIS — M898X9 Other specified disorders of bone, unspecified site: Secondary | ICD-10-CM | POA: Insufficient documentation

## 2016-12-12 DIAGNOSIS — R911 Solitary pulmonary nodule: Secondary | ICD-10-CM | POA: Insufficient documentation

## 2016-12-12 DIAGNOSIS — J320 Chronic maxillary sinusitis: Secondary | ICD-10-CM | POA: Diagnosis not present

## 2016-12-12 DIAGNOSIS — D472 Monoclonal gammopathy: Secondary | ICD-10-CM | POA: Diagnosis not present

## 2016-12-12 LAB — GLUCOSE, CAPILLARY: Glucose-Capillary: 128 mg/dL — ABNORMAL HIGH (ref 65–99)

## 2016-12-12 MED ORDER — FLUDEOXYGLUCOSE F - 18 (FDG) INJECTION
9.2000 | Freq: Once | INTRAVENOUS | Status: AC | PRN
  Administered 2016-12-12: 9.2 via INTRAVENOUS

## 2016-12-26 ENCOUNTER — Telehealth: Payer: Self-pay | Admitting: *Deleted

## 2016-12-26 NOTE — Telephone Encounter (Signed)
-----   Message from Brunetta Genera, MD sent at 12/26/2016 12:23 AM EDT ----- Plz let patient know. PET/CT with no overt evidence of malignancy. Will continue f/u with labs as per plan. thx GK

## 2016-12-26 NOTE — Telephone Encounter (Signed)
Per staff message, sw patient regarding PET results.  Pt aware PET WNL, no additional instructions at this time.  Keep follow up scheduled for august.  Pt aware of apt date/time.  Pt thankful for call.

## 2017-01-09 DIAGNOSIS — D519 Vitamin B12 deficiency anemia, unspecified: Secondary | ICD-10-CM | POA: Diagnosis not present

## 2017-02-09 DIAGNOSIS — G8929 Other chronic pain: Secondary | ICD-10-CM | POA: Diagnosis not present

## 2017-02-09 DIAGNOSIS — M170 Bilateral primary osteoarthritis of knee: Secondary | ICD-10-CM | POA: Diagnosis not present

## 2017-02-15 DIAGNOSIS — D519 Vitamin B12 deficiency anemia, unspecified: Secondary | ICD-10-CM | POA: Diagnosis not present

## 2017-03-07 DIAGNOSIS — J0101 Acute recurrent maxillary sinusitis: Secondary | ICD-10-CM | POA: Diagnosis not present

## 2017-03-09 DIAGNOSIS — D519 Vitamin B12 deficiency anemia, unspecified: Secondary | ICD-10-CM | POA: Diagnosis not present

## 2017-03-09 DIAGNOSIS — E782 Mixed hyperlipidemia: Secondary | ICD-10-CM | POA: Diagnosis not present

## 2017-03-09 DIAGNOSIS — E1165 Type 2 diabetes mellitus with hyperglycemia: Secondary | ICD-10-CM | POA: Diagnosis not present

## 2017-03-09 DIAGNOSIS — E1122 Type 2 diabetes mellitus with diabetic chronic kidney disease: Secondary | ICD-10-CM | POA: Diagnosis not present

## 2017-03-13 DIAGNOSIS — K219 Gastro-esophageal reflux disease without esophagitis: Secondary | ICD-10-CM | POA: Diagnosis not present

## 2017-03-13 DIAGNOSIS — I10 Essential (primary) hypertension: Secondary | ICD-10-CM | POA: Diagnosis not present

## 2017-03-13 DIAGNOSIS — E782 Mixed hyperlipidemia: Secondary | ICD-10-CM | POA: Diagnosis not present

## 2017-03-13 DIAGNOSIS — Z1212 Encounter for screening for malignant neoplasm of rectum: Secondary | ICD-10-CM | POA: Diagnosis not present

## 2017-03-13 DIAGNOSIS — M1712 Unilateral primary osteoarthritis, left knee: Secondary | ICD-10-CM | POA: Diagnosis not present

## 2017-03-13 DIAGNOSIS — E1122 Type 2 diabetes mellitus with diabetic chronic kidney disease: Secondary | ICD-10-CM | POA: Diagnosis not present

## 2017-03-13 DIAGNOSIS — M1711 Unilateral primary osteoarthritis, right knee: Secondary | ICD-10-CM | POA: Diagnosis not present

## 2017-03-13 DIAGNOSIS — N183 Chronic kidney disease, stage 3 (moderate): Secondary | ICD-10-CM | POA: Diagnosis not present

## 2017-03-16 ENCOUNTER — Other Ambulatory Visit (HOSPITAL_COMMUNITY): Payer: Self-pay | Admitting: Family Medicine

## 2017-03-16 DIAGNOSIS — Z23 Encounter for immunization: Secondary | ICD-10-CM | POA: Diagnosis not present

## 2017-03-16 DIAGNOSIS — E213 Hyperparathyroidism, unspecified: Secondary | ICD-10-CM

## 2017-03-24 ENCOUNTER — Encounter (HOSPITAL_COMMUNITY)
Admission: RE | Admit: 2017-03-24 | Discharge: 2017-03-24 | Disposition: A | Discharge status: Home / Self Care | Payer: Medicare Other | Source: Ambulatory Visit | Attending: Family Medicine | Admitting: Family Medicine

## 2017-03-24 DIAGNOSIS — E213 Hyperparathyroidism, unspecified: Secondary | ICD-10-CM | POA: Diagnosis not present

## 2017-03-24 DIAGNOSIS — C750 Malignant neoplasm of parathyroid gland: Secondary | ICD-10-CM | POA: Diagnosis not present

## 2017-03-24 MED ORDER — TECHNETIUM TC 99M SESTAMIBI GENERIC - CARDIOLITE
25.0000 | Freq: Once | INTRAVENOUS | Status: AC | PRN
  Administered 2017-03-24: 25 via INTRAVENOUS

## 2017-03-30 ENCOUNTER — Ambulatory Visit: Payer: Medicare Other | Admitting: Hematology

## 2017-03-30 ENCOUNTER — Other Ambulatory Visit: Payer: Medicare Other

## 2017-04-25 ENCOUNTER — Encounter (HOSPITAL_COMMUNITY): Payer: Self-pay | Admitting: Surgery

## 2017-04-25 ENCOUNTER — Ambulatory Visit: Payer: Self-pay | Admitting: Surgery

## 2017-04-25 DIAGNOSIS — E210 Primary hyperparathyroidism: Secondary | ICD-10-CM | POA: Diagnosis present

## 2017-04-25 NOTE — H&P (Signed)
General Surgery [facility]  Carolyn [name] 04/25/2017 9:15 AM Location: [facility] Patient #: [id number] DOB: [birth year] Widowed / Language: [name] / Race: White Female   History of Present Illness Carolyn Regal MD; 04/25/2017 10:00 AM) The patient is a 76 year old female who presents with primary hyperparathyroidism.  CC: primary hyperparathyroidism  Patient is referred by Dr. Gar Ponto for evaluation and management of primary hyperparathyroidism. Patient had been noted with elevated serum calcium levels for many years. Patient had developed chronic fatigue. She complains of weakness. She complains of knee pain. She has had more problems walking. She denies any history of nephrolithiasis. She has not had a bone density scan in many years. There is no family history of parathyroid disease or other endocrine neoplasm. Patient has had no prior surgery on the head or neck. Recent laboratory studies show an elevated serum calcium level of 11.3. Intact PTH level appears to have been obtained at the office of her primary care physician but I do not have a record of that result. We will try to obtain that. Patient did undergo a nuclear medicine parathyroid scan on March 24, 2017. This was successful and localized a parathyroid adenoma to the right inferior position. Patient now presents for evaluation for parathyroidectomy. She is accompanied by her daughter.   Allergies (Tanisha A. Owens Shark, [place]; 04/25/2017 9:16 AM) ACE Inhibitors  Sulfa Antibiotics  Zetia *ANTIHYPERLIPIDEMICS*  Allergies Reconciled   Medication History (Tanisha A. Owens Shark, [place]; 04/25/2017 9:17 AM) Citalopram Hydrobromide (20MG  Tablet, Oral) Active. Losartan Potassium-HCTZ (100-12.5MG  Tablet, Oral) Active. MetFORMIN HCl ER (500MG  Tablet ER 24HR, Oral) Active. Omeprazole (20MG  Capsule DR, Oral) Active. Medications Reconciled  Vitals (Tanisha A. Brown RMA; 04/25/2017 9:16  AM) 04/25/2017 9:15 AM Weight: 179.4 lb Height: 67in Body Surface Area: 1.93 m Body Mass Index: 28.1 kg/m  Temp.: 98.23F  Pulse: 68 (Regular)  P.OX: 97% (Room air) BP: 138/86 (Sitting, Left Arm, Standard)       Physical Exam Carolyn Regal MD; 04/25/2017 10:01 AM) The physical exam findings are as follows: Note:CONSTITUTIONAL See vital signs recorded above  GENERAL APPEARANCE Development: normal Nutritional status: normal Gross deformities: none  SKIN Rash, lesions, ulcers: none Induration, erythema: none Nodules: none palpable  EYES Conjunctiva and lids: normal Pupils: equal and reactive Iris: normal bilaterally  EARS, NOSE, MOUTH, THROAT External ears: no lesion or deformity External nose: no lesion or deformity Hearing: grossly normal Lips: no lesion or deformity Dentition: normal for age Oral mucosa: moist  NECK Symmetric: yes Trachea: midline Thyroid: no palpable nodules in the thyroid bed  CHEST Respiratory effort: normal Retraction or accessory muscle use: no Breath sounds: normal bilaterally Rales, rhonchi, wheeze: none  CARDIOVASCULAR Auscultation: regular rhythm, normal rate Murmurs: none Pulses: carotid and radial pulse 2+ palpable Lower extremity edema: none Lower extremity varicosities: none  MUSCULOSKELETAL Station and gait: normal Digits and nails: no clubbing or cyanosis Muscle strength: grossly normal all extremities Range of motion: grossly normal all extremities Deformity: none  LYMPHATIC Cervical: none palpable Supraclavicular: none palpable  PSYCHIATRIC Oriented to person, place, and time: yes Mood and affect: normal for situation Judgment and insight: appropriate for situation    Assessment & Plan Carolyn Regal MD; 04/25/2017 10:03 AM) PRIMARY HYPERPARATHYROIDISM (E21.0) Current Plans Pt Education - Pamphlet Given - The Parathyroid Surgery Book: discussed with patient and provided information. Patient  is referred by her primary care physician for evaluation of primary hyperparathyroidism. She is accompanied by her daughter.  They are provided with written literature on parathyroid surgery to review at home.  Patient has elevated serum calcium level and elevated intact PTH level consistent with primary hyperparathyroidism. Nuclear medicine parathyroid scan localizes a parathyroid adenoma to the right inferior position. Further diagnostic studies are not necessary at this time.  I have recommended proceeding with minimally invasive parathyroidectomy. This procedure is successful 95% of the time. We discussed the risk and benefits of the procedure. We discussed the potential for recurrent laryngeal nerve injury. We discussed the potential for additional surgery at some point in the future. Patient understands and wishes to proceed with outpatient surgery in the near future.  The risks and benefits of the procedure have been discussed at length with the patient. The patient understands the proposed procedure, potential alternative treatments, and the course of recovery to be expected. All of the patient's questions have been answered at this time. The patient wishes to proceed with surgery.  Carolyn Regal, MD, [facility] Office: [phone]

## 2017-04-26 ENCOUNTER — Encounter (HOSPITAL_COMMUNITY): Payer: Self-pay | Admitting: *Deleted

## 2017-04-26 NOTE — Progress Notes (Signed)
Pt denies SOB, chest pain, and being under the care of a cardiologist. Pt denies having a cardiac cath and echo. Pt denies recent labs. Pt made aware to stop taking Aspirin, vitamins, fish oil, and herbal medications. Do not take any NSAIDs ie: Ibuprofen, Advil, Naproxen (Aleve), Motrin, BC and Goody Powder or any medication containing Aspirin. Pt made aware to not take Metformin DOS. Pt made aware to check BG every 2 hours prior to arrival to hospital on DOS. Pt made aware to treat a BG < 70 withr 4 ounces of apple or cranberry juice, wait 15 minutes after intervention to recheck BG, if BG remains < 70, call Short Stay unit to speak with a nurse. Pt verbalized understanding of all pre-op instructions. Anesthesia asked to review pt EKG.

## 2017-04-27 ENCOUNTER — Encounter (HOSPITAL_COMMUNITY)
Admission: RE | Disposition: A | Discharge status: Home / Self Care | Payer: Self-pay | Source: Ambulatory Visit | Attending: Surgery

## 2017-04-27 ENCOUNTER — Ambulatory Visit (HOSPITAL_COMMUNITY): Payer: Medicare Other | Admitting: Anesthesiology

## 2017-04-27 ENCOUNTER — Ambulatory Visit (HOSPITAL_COMMUNITY)
Admission: RE | Admit: 2017-04-27 | Discharge: 2017-04-27 | Disposition: A | Discharge status: Home / Self Care | Payer: Medicare Other | Source: Ambulatory Visit | Attending: Surgery | Admitting: Surgery

## 2017-04-27 ENCOUNTER — Encounter (HOSPITAL_COMMUNITY): Payer: Self-pay | Admitting: Urology

## 2017-04-27 DIAGNOSIS — K219 Gastro-esophageal reflux disease without esophagitis: Secondary | ICD-10-CM | POA: Diagnosis not present

## 2017-04-27 DIAGNOSIS — D351 Benign neoplasm of parathyroid gland: Secondary | ICD-10-CM | POA: Diagnosis not present

## 2017-04-27 DIAGNOSIS — E210 Primary hyperparathyroidism: Secondary | ICD-10-CM | POA: Diagnosis not present

## 2017-04-27 DIAGNOSIS — Z79899 Other long term (current) drug therapy: Secondary | ICD-10-CM | POA: Diagnosis not present

## 2017-04-27 DIAGNOSIS — Z7984 Long term (current) use of oral hypoglycemic drugs: Secondary | ICD-10-CM | POA: Insufficient documentation

## 2017-04-27 DIAGNOSIS — I10 Essential (primary) hypertension: Secondary | ICD-10-CM | POA: Diagnosis not present

## 2017-04-27 DIAGNOSIS — E119 Type 2 diabetes mellitus without complications: Secondary | ICD-10-CM | POA: Diagnosis not present

## 2017-04-27 HISTORY — DX: Other complications of anesthesia, initial encounter: T88.59XA

## 2017-04-27 HISTORY — DX: Primary hyperparathyroidism: E21.0

## 2017-04-27 HISTORY — DX: Presence of dental prosthetic device (complete) (partial): Z97.2

## 2017-04-27 HISTORY — DX: Adverse effect of unspecified anesthetic, initial encounter: T41.45XA

## 2017-04-27 HISTORY — DX: Presence of spectacles and contact lenses: Z97.3

## 2017-04-27 HISTORY — PX: PARATHYROIDECTOMY: SHX19

## 2017-04-27 LAB — COMPREHENSIVE METABOLIC PANEL
ALT: 19 U/L (ref 14–54)
AST: 22 U/L (ref 15–41)
Albumin: 3.9 g/dL (ref 3.5–5.0)
Alkaline Phosphatase: 62 U/L (ref 38–126)
Anion gap: 9 (ref 5–15)
BUN: 17 mg/dL (ref 6–20)
CO2: 24 mmol/L (ref 22–32)
Calcium: 10.5 mg/dL — ABNORMAL HIGH (ref 8.9–10.3)
Chloride: 100 mmol/L — ABNORMAL LOW (ref 101–111)
Creatinine, Ser: 0.85 mg/dL (ref 0.44–1.00)
GFR calc Af Amer: >60 mL/min (ref >60)
GFR calc non Af Amer: >60 mL/min (ref >60)
Glucose, Bld: 126 mg/dL — ABNORMAL HIGH (ref 65–99)
Potassium: 3.8 mmol/L (ref 3.5–5.1)
Sodium: 133 mmol/L — ABNORMAL LOW (ref 135–145)
Total Bilirubin: 0.4 mg/dL (ref 0.3–1.2)
Total Protein: 6.6 g/dL (ref 6.5–8.1)

## 2017-04-27 LAB — CBC
HCT: 35.8 % — ABNORMAL LOW (ref 36.0–46.0)
Hemoglobin: 12.2 g/dL (ref 12.0–15.0)
MCH: 30.2 pg (ref 26.0–34.0)
MCHC: 34.1 g/dL (ref 30.0–36.0)
MCV: 88.6 fL (ref 78.0–100.0)
Platelets: 239 10*3/uL (ref 150–400)
RBC: 4.04 MIL/uL (ref 3.87–5.11)
RDW: 12.7 % (ref 11.5–15.5)
WBC: 4.7 10*3/uL (ref 4.0–10.5)

## 2017-04-27 LAB — GLUCOSE, CAPILLARY
Glucose-Capillary: 136 mg/dL — ABNORMAL HIGH (ref 65–99)
Glucose-Capillary: 147 mg/dL — ABNORMAL HIGH (ref 65–99)

## 2017-04-27 LAB — HEMOGLOBIN A1C
Hgb A1c MFr Bld: 6.4 % — ABNORMAL HIGH (ref 4.8–5.6)
Mean Plasma Glucose: 136.98 mg/dL

## 2017-04-27 SURGERY — PARATHYROIDECTOMY
Anesthesia: General | Site: Neck | Laterality: Right

## 2017-04-27 MED ORDER — ROCURONIUM BROMIDE 100 MG/10ML IV SOLN
INTRAVENOUS | Status: DC | PRN
  Administered 2017-04-27: 35 mg via INTRAVENOUS

## 2017-04-27 MED ORDER — CEFAZOLIN SODIUM-DEXTROSE 2-4 GM/100ML-% IV SOLN
INTRAVENOUS | Status: AC
  Filled 2017-04-27: qty 100

## 2017-04-27 MED ORDER — DEXTROSE 5 % IV SOLN
INTRAVENOUS | Status: DC | PRN
  Administered 2017-04-27: 20 ug/min via INTRAVENOUS

## 2017-04-27 MED ORDER — HEMOSTATIC AGENTS (NO CHARGE) OPTIME
TOPICAL | Status: DC | PRN
  Administered 2017-04-27: 1 via TOPICAL

## 2017-04-27 MED ORDER — PROMETHAZINE HCL 25 MG/ML IJ SOLN: 6.2500 mg | INTRAMUSCULAR | Status: DC | PRN

## 2017-04-27 MED ORDER — OXYCODONE HCL 5 MG PO TABS
5.0000 mg | ORAL_TABLET | Freq: Once | ORAL | Status: AC | PRN
  Administered 2017-04-27: 5 mg via ORAL

## 2017-04-27 MED ORDER — SCOPOLAMINE 1 MG/3DAYS TD PT72
MEDICATED_PATCH | TRANSDERMAL | Status: DC | PRN
  Administered 2017-04-27: 1 via TRANSDERMAL

## 2017-04-27 MED ORDER — BUPIVACAINE HCL (PF) 0.5 % IJ SOLN
INTRAMUSCULAR | Status: DC | PRN
  Administered 2017-04-27: 5 mL

## 2017-04-27 MED ORDER — SUGAMMADEX SODIUM 200 MG/2ML IV SOLN
INTRAVENOUS | Status: AC
  Filled 2017-04-27: qty 2

## 2017-04-27 MED ORDER — FENTANYL CITRATE (PF) 250 MCG/5ML IJ SOLN
INTRAMUSCULAR | Status: AC
  Filled 2017-04-27: qty 5

## 2017-04-27 MED ORDER — ONDANSETRON HCL 4 MG/2ML IJ SOLN
INTRAMUSCULAR | Status: DC | PRN
  Administered 2017-04-27: 4 mg via INTRAVENOUS

## 2017-04-27 MED ORDER — PROPOFOL 10 MG/ML IV BOLUS
INTRAVENOUS | Status: DC | PRN
  Administered 2017-04-27: 125 mg via INTRAVENOUS

## 2017-04-27 MED ORDER — GLYCOPYRROLATE 0.2 MG/ML IJ SOLN
INTRAMUSCULAR | Status: DC | PRN
  Administered 2017-04-27: 0.2 mg via INTRAVENOUS

## 2017-04-27 MED ORDER — LIDOCAINE HCL (CARDIAC) 20 MG/ML IV SOLN
INTRAVENOUS | Status: DC | PRN
  Administered 2017-04-27: 80 mg via INTRAVENOUS

## 2017-04-27 MED ORDER — CHLORHEXIDINE GLUCONATE CLOTH 2 % EX PADS: 6.0000 | MEDICATED_PAD | Freq: Once | CUTANEOUS | Status: DC

## 2017-04-27 MED ORDER — OXYCODONE HCL 5 MG/5ML PO SOLN: 5.0000 mg | Freq: Once | ORAL | Status: AC | PRN

## 2017-04-27 MED ORDER — HYDROCODONE-ACETAMINOPHEN 5-325 MG PO TABS: 1.0000 | ORAL_TABLET | ORAL | 0 refills | Status: DC | PRN

## 2017-04-27 MED ORDER — DEXAMETHASONE SODIUM PHOSPHATE 10 MG/ML IJ SOLN
INTRAMUSCULAR | Status: DC | PRN
  Administered 2017-04-27: 10 mg via INTRAVENOUS

## 2017-04-27 MED ORDER — 0.9 % SODIUM CHLORIDE (POUR BTL) OPTIME
TOPICAL | Status: DC | PRN
  Administered 2017-04-27: 1000 mL

## 2017-04-27 MED ORDER — SUCCINYLCHOLINE CHLORIDE 200 MG/10ML IV SOSY
PREFILLED_SYRINGE | INTRAVENOUS | Status: AC
  Filled 2017-04-27: qty 10

## 2017-04-27 MED ORDER — SCOPOLAMINE 1 MG/3DAYS TD PT72
MEDICATED_PATCH | TRANSDERMAL | Status: AC
  Filled 2017-04-27: qty 1

## 2017-04-27 MED ORDER — HYDROMORPHONE HCL 1 MG/ML IJ SOLN
INTRAMUSCULAR | Status: AC
  Administered 2017-04-27: 0.5 mg via INTRAVENOUS
  Filled 2017-04-27: qty 1

## 2017-04-27 MED ORDER — LACTATED RINGERS IV SOLN
INTRAVENOUS | Status: DC | PRN
  Administered 2017-04-27 (×2): via INTRAVENOUS

## 2017-04-27 MED ORDER — FENTANYL CITRATE (PF) 100 MCG/2ML IJ SOLN
INTRAMUSCULAR | Status: DC | PRN
  Administered 2017-04-27: 100 ug via INTRAVENOUS

## 2017-04-27 MED ORDER — BUPIVACAINE HCL (PF) 0.5 % IJ SOLN
INTRAMUSCULAR | Status: AC
  Filled 2017-04-27: qty 30

## 2017-04-27 MED ORDER — CEFAZOLIN SODIUM-DEXTROSE 2-4 GM/100ML-% IV SOLN
2.0000 g | INTRAVENOUS | Status: AC
  Administered 2017-04-27: 2 g via INTRAVENOUS

## 2017-04-27 MED ORDER — OXYCODONE HCL 5 MG PO TABS
ORAL_TABLET | ORAL | Status: AC
  Filled 2017-04-27: qty 1

## 2017-04-27 MED ORDER — HYDROMORPHONE HCL 1 MG/ML IJ SOLN
0.2500 mg | INTRAMUSCULAR | Status: DC | PRN
  Administered 2017-04-27 (×3): 0.5 mg via INTRAVENOUS

## 2017-04-27 MED ORDER — SUGAMMADEX SODIUM 200 MG/2ML IV SOLN
INTRAVENOUS | Status: DC | PRN
  Administered 2017-04-27: 120 mg via INTRAVENOUS

## 2017-04-27 MED ORDER — HYDROMORPHONE HCL 1 MG/ML IJ SOLN
INTRAMUSCULAR | Status: AC
  Filled 2017-04-27: qty 1

## 2017-04-27 MED ORDER — ROCURONIUM BROMIDE 10 MG/ML (PF) SYRINGE
PREFILLED_SYRINGE | INTRAVENOUS | Status: AC
  Filled 2017-04-27: qty 5

## 2017-04-27 MED ORDER — HYDROMORPHONE HCL 1 MG/ML IJ SOLN: 0.2500 mg | INTRAMUSCULAR | Status: DC | PRN

## 2017-04-27 MED ORDER — ONDANSETRON HCL 4 MG/2ML IJ SOLN
INTRAMUSCULAR | Status: AC
  Filled 2017-04-27: qty 2

## 2017-04-27 MED ORDER — DEXAMETHASONE SODIUM PHOSPHATE 10 MG/ML IJ SOLN
INTRAMUSCULAR | Status: AC
  Filled 2017-04-27: qty 1

## 2017-04-27 SURGICAL SUPPLY — 48 items
ATTRACTOMAT 16X20 MAGNETIC DRP (DRAPES) ×3 IMPLANT
BLADE SURG 15 STRL LF DISP TIS (BLADE) ×1 IMPLANT
BLADE SURG 15 STRL SS (BLADE) ×3
CANISTER SUCT 3000ML PPV (MISCELLANEOUS) ×3 IMPLANT
CHLORAPREP W/TINT 26ML (MISCELLANEOUS) ×3 IMPLANT
CLIP VESOCCLUDE MED 6/CT (CLIP) ×3 IMPLANT
CLIP VESOCCLUDE SM WIDE 6/CT (CLIP) ×3 IMPLANT
CLOSURE WOUND 1/2 X4 (GAUZE/BANDAGES/DRESSINGS) ×1
CONT SPEC 4OZ CLIKSEAL STRL BL (MISCELLANEOUS) ×3 IMPLANT
COVER SURGICAL LIGHT HANDLE (MISCELLANEOUS) ×3 IMPLANT
CRADLE DONUT ADULT HEAD (MISCELLANEOUS) ×3 IMPLANT
DRAPE LAPAROTOMY 100X72 PEDS (DRAPES) ×3 IMPLANT
DRAPE UTILITY XL STRL (DRAPES) ×3 IMPLANT
ELECT CAUTERY BLADE 6.4 (BLADE) ×3 IMPLANT
ELECT REM PT RETURN 9FT ADLT (ELECTROSURGICAL) ×3
ELECTRODE REM PT RTRN 9FT ADLT (ELECTROSURGICAL) ×1 IMPLANT
GAUZE SPONGE 2X2 8PLY STRL LF (GAUZE/BANDAGES/DRESSINGS) ×1 IMPLANT
GAUZE SPONGE 4X4 16PLY XRAY LF (GAUZE/BANDAGES/DRESSINGS) ×3 IMPLANT
GLOVE SURG ORTHO 8.0 STRL STRW (GLOVE) ×3 IMPLANT
GOWN STRL REUS W/ TWL LRG LVL3 (GOWN DISPOSABLE) ×1 IMPLANT
GOWN STRL REUS W/ TWL XL LVL3 (GOWN DISPOSABLE) ×1 IMPLANT
GOWN STRL REUS W/TWL LRG LVL3 (GOWN DISPOSABLE) ×3
GOWN STRL REUS W/TWL XL LVL3 (GOWN DISPOSABLE) ×3
HEMOSTAT SURGICEL 2X4 FIBR (HEMOSTASIS) ×5 IMPLANT
ILLUMINATOR WAVEGUIDE N/F (MISCELLANEOUS) ×3 IMPLANT
KIT BASIN OR (CUSTOM PROCEDURE TRAY) ×3 IMPLANT
KIT ROOM TURNOVER OR (KITS) ×3 IMPLANT
NDL HYPO 25GX1X1/2 BEV (NEEDLE) ×1 IMPLANT
NEEDLE HYPO 25GX1X1/2 BEV (NEEDLE) ×3 IMPLANT
NS IRRIG 1000ML POUR BTL (IV SOLUTION) ×3 IMPLANT
PACK SURGICAL SETUP 50X90 (CUSTOM PROCEDURE TRAY) ×3 IMPLANT
PAD ARMBOARD 7.5X6 YLW CONV (MISCELLANEOUS) ×3 IMPLANT
PENCIL BUTTON HOLSTER BLD 10FT (ELECTRODE) ×3 IMPLANT
SPONGE GAUZE 2X2 STER 10/PKG (GAUZE/BANDAGES/DRESSINGS) ×2
SPONGE INTESTINAL PEANUT (DISPOSABLE) IMPLANT
STRIP CLOSURE SKIN 1/2X4 (GAUZE/BANDAGES/DRESSINGS) ×2 IMPLANT
SUT MNCRL AB 4-0 PS2 18 (SUTURE) ×3 IMPLANT
SUT SILK 2 0 (SUTURE)
SUT SILK 2-0 18XBRD TIE 12 (SUTURE) IMPLANT
SUT SILK 3 0 (SUTURE)
SUT SILK 3-0 18XBRD TIE 12 (SUTURE) IMPLANT
SUT VIC AB 3-0 SH 18 (SUTURE) ×3 IMPLANT
SYR BULB 3OZ (MISCELLANEOUS) ×3 IMPLANT
SYR CONTROL 10ML LL (SYRINGE) ×3 IMPLANT
TOWEL OR 17X24 6PK STRL BLUE (TOWEL DISPOSABLE) ×3 IMPLANT
TOWEL OR 17X26 10 PK STRL BLUE (TOWEL DISPOSABLE) ×3 IMPLANT
TUBE CONNECTING 12'X1/4 (SUCTIONS) ×1
TUBE CONNECTING 12X1/4 (SUCTIONS) ×2 IMPLANT

## 2017-04-27 NOTE — Anesthesia Postprocedure Evaluation (Signed)
Anesthesia Post Note  Patient: Carolyn [name]  Procedure(s) Performed: Procedure(s) (LRB): RIGHT PARATHYROIDECTOMY (Right)     Patient location during evaluation: PACU Anesthesia Type: General Level of consciousness: awake and alert Pain management: pain level controlled Vital Signs Assessment: post-procedure vital signs reviewed and stable Respiratory status: spontaneous breathing, nonlabored ventilation, respiratory function stable and patient connected to nasal cannula oxygen Cardiovascular status: blood pressure returned to baseline and stable Postop Assessment: no apparent nausea or vomiting Anesthetic complications: no    Last Vitals:  Vitals:   04/27/17 0945 04/27/17 1000  BP: (!) 189/102 (!) 187/100  Pulse: 71 76  Resp: 10 15  Temp:  36.5 C  SpO2: 95% 95%    Last Pain:  Vitals:   04/27/17 1001  TempSrc:   PainSc: 0-No pain                 Aamirah Salmi,JAMES TERRILL

## 2017-04-27 NOTE — Transfer of Care (Signed)
Immediate Anesthesia Transfer of Care Note  Patient: Carolyn [name]  Procedure(s) Performed: Procedure(s): RIGHT PARATHYROIDECTOMY (Right)  Patient Location: PACU  Anesthesia Type:General  Level of Consciousness: awake, sedated and patient cooperative  Airway & Oxygen Therapy: Patient Spontanous Breathing and Patient connected to nasal cannula oxygen  Post-op Assessment: Report given to RN, Post -op Vital signs reviewed and stable and Patient moving all extremities  Post vital signs: Reviewed and stable  Last Vitals:  Vitals:   04/27/17 0600 04/27/17 0845  BP: (!) 176/95 (!) (P) 167/94  Pulse:  (P) 68  Resp:  (P) 13  Temp:  36.7 C  SpO2:  (P) 100%    Last Pain:  Vitals:   04/27/17 0555  TempSrc: Oral      Patients Stated Pain Goal: 3 (72/62/03 5597)  Complications: No apparent anesthesia complications

## 2017-04-27 NOTE — Anesthesia Procedure Notes (Signed)
Procedure Name: Intubation Date/Time: 04/27/2017 7:34 AM Performed by: Izora Gala Pre-anesthesia Checklist: Patient identified, Emergency Drugs available, Suction available and Patient being monitored Patient Re-evaluated:Patient Re-evaluated prior to induction Oxygen Delivery Method: Circle system utilized Preoxygenation: Pre-oxygenation with 100% oxygen Induction Type: IV induction Ventilation: Mask ventilation without difficulty Laryngoscope Size: Miller and 3 Grade View: Grade I Tube type: Oral Tube size: 7.0 mm Number of attempts: 2 Airway Equipment and Method: Stylet Placement Confirmation: ETT inserted through vocal cords under direct vision,  positive ETCO2 and breath sounds checked- equal and bilateral

## 2017-04-27 NOTE — Interval H&P Note (Signed)
History and Physical Interval Note:  04/27/2017 7:05 AM  Carolyn [name]  has presented today for surgery, with the diagnosis of primary hyperparathyroidism  The various methods of treatment have been discussed with the patient and family. After consideration of risks, benefits and other options for treatment, the patient has consented to    Procedure(s): RIGHT PARATHYROIDECTOMY (Right) as a surgical intervention .    The patient's history has been reviewed, patient examined, no change in status, stable for surgery.  I have reviewed the patient's chart and labs.  Questions were answered to the patient's satisfaction.    [name], MD, [facility] Office: [place]

## 2017-04-27 NOTE — Anesthesia Preprocedure Evaluation (Addendum)
Anesthesia Evaluation  Patient identified by MRN, date of birth, ID band Patient awake    Reviewed: Allergy & Precautions, NPO status , Patient's Chart, lab work & pertinent test results  Airway Mallampati: II  TM Distance: <3 FB Neck ROM: Full    Dental  (+) Edentulous Upper   Pulmonary neg pulmonary ROS,    breath sounds clear to auscultation       Cardiovascular hypertension,  Rhythm:Regular Rate:Normal     Neuro/Psych negative neurological ROS     GI/Hepatic Neg liver ROS, GERD  ,  Endo/Other  diabeteshypercalcemia  Renal/GU negative Renal ROS     Musculoskeletal  (+) Arthritis ,   Abdominal   Peds  Hematology   Anesthesia Other Findings   Reproductive/Obstetrics                            Anesthesia Physical Anesthesia Plan  ASA: III  Anesthesia Plan: General   Post-op Pain Management:    Induction: Intravenous  PONV Risk Score and Plan: 4 or greater and Ondansetron, Dexamethasone, Scopolamine patch - Pre-op and Treatment may vary due to age or medical condition  Airway Management Planned: Oral ETT  Additional Equipment:   Intra-op Plan:   Post-operative Plan:   Informed Consent: I have reviewed the patients History and Physical, chart, labs and discussed the procedure including the risks, benefits and alternatives for the proposed anesthesia with the patient or authorized representative who has indicated his/her understanding and acceptance.   Dental advisory given  Plan Discussed with: CRNA  Anesthesia Plan Comments:         Anesthesia Quick Evaluation

## 2017-04-27 NOTE — Op Note (Addendum)
OPERATIVE REPORT - PARATHYROIDECTOMY  Preoperative diagnosis: Primary hyperparathyroidism  Postop diagnosis: Same  Procedure: Right inferior minimally invasive parathyroidectomy  Surgeon:  [name], MD, FACS  Anesthesia: Gen. endotracheal  Estimated blood loss: Minimal  Preparation: ChloraPrep  Indications: The patient is a 76 year old female who presents with primary hyperparathyroidism.  Patient is referred by Dr. Gar Ponto for evaluation and management of primary hyperparathyroidism. Patient had been noted with elevated serum calcium levels for many years. Patient had developed chronic fatigue. She complains of weakness. She complains of knee pain. She has had more problems walking. She denies any history of nephrolithiasis. She has not had a bone density scan in many years. There is no family history of parathyroid disease or other endocrine neoplasm. Patient has had no prior surgery on the head or neck. Recent laboratory studies show an elevated serum calcium level of 11.3. Intact PTH level appears to have been obtained at the office of her primary care physician but I do not have a record of that result. We will try to obtain that. Patient did undergo a nuclear medicine parathyroid scan on March 24, 2017. This was successful and localized a parathyroid adenoma to the right inferior position. Patient now presents for evaluation for parathyroidectomy.  Procedure: The patient was prepared in the pre-operative holding area. The patient was brought to operating room and placed in a supine position on the operating room table. Following administration of general anesthesia, the patient was positioned and then prepped and draped in the usual strict aseptic fashion. After ascertaining that an adequate level of anesthesia been achieved, a neck incision was made with a #15 blade. Dissection was carried through subcutaneous tissues and platysma. Hemostasis was obtained with the  electrocautery. Skin flaps were developed circumferentially and a Weitlander retractor was placed for exposure.  Strap muscles were incised in the midline. Strap muscles were reflected exposing the thyroid lobe. With gentle blunt dissection the thyroid lobe was mobilized.  Dissection was carried through adipose tissue and an enlarged parathyroid gland was identified. It was gently mobilized. Vascular structures were divided between small and medium ligaclips. Care was taken to avoid the recurrent laryngeal nerve and the esophagus. The parathyroid gland was completely excised. It was submitted to pathology where frozen section confirmed parathyroid tissue consistent with adenoma.  Neck was irrigated with warm saline and good hemostasis was noted. Fibrillar was placed in the operative field. Strap muscles were reapproximated in the midline with interrupted 3-0 Vicryl sutures. Platysma was closed with interrupted 3-0 Vicryl sutures. Skin was closed with a running 4-0 Monocryl subcuticular suture. Marcaine was infiltrated circumferentially. Wound was washed and dried and Steristrips were applied. Patient was awakened from anesthesia and brought to the recovery room. The patient tolerated the procedure well.   [name], MD, [facility]

## 2017-04-28 ENCOUNTER — Encounter (HOSPITAL_COMMUNITY): Payer: Self-pay | Admitting: Surgery

## 2017-05-04 DIAGNOSIS — J0100 Acute maxillary sinusitis, unspecified: Secondary | ICD-10-CM | POA: Diagnosis not present

## 2017-05-04 DIAGNOSIS — D519 Vitamin B12 deficiency anemia, unspecified: Secondary | ICD-10-CM | POA: Diagnosis not present

## 2017-05-11 DIAGNOSIS — E210 Primary hyperparathyroidism: Secondary | ICD-10-CM | POA: Diagnosis not present

## 2017-05-16 DIAGNOSIS — H6502 Acute serous otitis media, left ear: Secondary | ICD-10-CM | POA: Diagnosis not present

## 2017-05-23 DIAGNOSIS — M1712 Unilateral primary osteoarthritis, left knee: Secondary | ICD-10-CM | POA: Diagnosis not present

## 2017-05-23 DIAGNOSIS — G8929 Other chronic pain: Secondary | ICD-10-CM | POA: Diagnosis not present

## 2017-06-05 DIAGNOSIS — L821 Other seborrheic keratosis: Secondary | ICD-10-CM | POA: Diagnosis not present

## 2017-06-05 DIAGNOSIS — D229 Melanocytic nevi, unspecified: Secondary | ICD-10-CM | POA: Diagnosis not present

## 2017-06-05 DIAGNOSIS — R202 Paresthesia of skin: Secondary | ICD-10-CM | POA: Diagnosis not present

## 2017-06-13 DIAGNOSIS — D519 Vitamin B12 deficiency anemia, unspecified: Secondary | ICD-10-CM | POA: Diagnosis not present

## 2017-06-16 DIAGNOSIS — R252 Cramp and spasm: Secondary | ICD-10-CM | POA: Diagnosis not present

## 2017-06-19 ENCOUNTER — Other Ambulatory Visit: Payer: Self-pay

## 2017-06-19 NOTE — Patient Outreach (Signed)
[facility]) Care Management  06/19/2017  Carolyn [name] [birth year] [id number]   Medication Adherence call to Carolyn [name] patient is showing past due under [facility] Ins.on Metformin ER 500 mg spoke to patient she said she will call in the prescription to the pharmacy she still have a few left and when she is finished with what she have, she will call the pharmacy and will order.   [facility] Management Direct Dial [phone]  Fax [phone] [email or web address]

## 2017-06-22 DIAGNOSIS — H04123 Dry eye syndrome of bilateral lacrimal glands: Secondary | ICD-10-CM | POA: Diagnosis not present

## 2017-06-22 DIAGNOSIS — E119 Type 2 diabetes mellitus without complications: Secondary | ICD-10-CM | POA: Diagnosis not present

## 2017-06-22 DIAGNOSIS — H353111 Nonexudative age-related macular degeneration, right eye, early dry stage: Secondary | ICD-10-CM | POA: Diagnosis not present

## 2017-06-22 DIAGNOSIS — H524 Presbyopia: Secondary | ICD-10-CM | POA: Diagnosis not present

## 2017-07-13 DIAGNOSIS — E1122 Type 2 diabetes mellitus with diabetic chronic kidney disease: Secondary | ICD-10-CM | POA: Diagnosis not present

## 2017-07-13 DIAGNOSIS — E1165 Type 2 diabetes mellitus with hyperglycemia: Secondary | ICD-10-CM | POA: Diagnosis not present

## 2017-07-13 DIAGNOSIS — J301 Allergic rhinitis due to pollen: Secondary | ICD-10-CM | POA: Diagnosis not present

## 2017-07-13 DIAGNOSIS — D519 Vitamin B12 deficiency anemia, unspecified: Secondary | ICD-10-CM | POA: Diagnosis not present

## 2017-07-13 DIAGNOSIS — E782 Mixed hyperlipidemia: Secondary | ICD-10-CM | POA: Diagnosis not present

## 2017-07-13 DIAGNOSIS — E1129 Type 2 diabetes mellitus with other diabetic kidney complication: Secondary | ICD-10-CM | POA: Diagnosis not present

## 2017-07-13 DIAGNOSIS — J0100 Acute maxillary sinusitis, unspecified: Secondary | ICD-10-CM | POA: Diagnosis not present

## 2017-08-09 DIAGNOSIS — E1122 Type 2 diabetes mellitus with diabetic chronic kidney disease: Secondary | ICD-10-CM | POA: Diagnosis not present

## 2017-08-09 DIAGNOSIS — I10 Essential (primary) hypertension: Secondary | ICD-10-CM | POA: Diagnosis not present

## 2017-08-09 DIAGNOSIS — E782 Mixed hyperlipidemia: Secondary | ICD-10-CM | POA: Diagnosis not present

## 2017-08-09 DIAGNOSIS — K219 Gastro-esophageal reflux disease without esophagitis: Secondary | ICD-10-CM | POA: Diagnosis not present

## 2017-08-11 DIAGNOSIS — G8929 Other chronic pain: Secondary | ICD-10-CM | POA: Diagnosis not present

## 2017-08-11 DIAGNOSIS — M170 Bilateral primary osteoarthritis of knee: Secondary | ICD-10-CM | POA: Diagnosis not present

## 2017-08-21 DIAGNOSIS — J209 Acute bronchitis, unspecified: Secondary | ICD-10-CM | POA: Diagnosis not present

## 2017-09-06 DIAGNOSIS — J0101 Acute recurrent maxillary sinusitis: Secondary | ICD-10-CM | POA: Diagnosis not present

## 2017-09-06 DIAGNOSIS — R040 Epistaxis: Secondary | ICD-10-CM | POA: Diagnosis not present

## 2017-09-06 DIAGNOSIS — D519 Vitamin B12 deficiency anemia, unspecified: Secondary | ICD-10-CM | POA: Diagnosis not present

## 2017-10-23 DIAGNOSIS — D519 Vitamin B12 deficiency anemia, unspecified: Secondary | ICD-10-CM | POA: Diagnosis not present

## 2017-11-14 DIAGNOSIS — Z0001 Encounter for general adult medical examination with abnormal findings: Secondary | ICD-10-CM | POA: Diagnosis not present

## 2017-11-17 DIAGNOSIS — M1712 Unilateral primary osteoarthritis, left knee: Secondary | ICD-10-CM | POA: Diagnosis not present

## 2017-11-17 DIAGNOSIS — N183 Chronic kidney disease, stage 3 (moderate): Secondary | ICD-10-CM | POA: Diagnosis not present

## 2017-11-17 DIAGNOSIS — E1122 Type 2 diabetes mellitus with diabetic chronic kidney disease: Secondary | ICD-10-CM | POA: Diagnosis not present

## 2017-11-17 DIAGNOSIS — E782 Mixed hyperlipidemia: Secondary | ICD-10-CM | POA: Diagnosis not present

## 2017-11-17 DIAGNOSIS — Z0001 Encounter for general adult medical examination with abnormal findings: Secondary | ICD-10-CM | POA: Diagnosis not present

## 2018-01-12 DIAGNOSIS — D519 Vitamin B12 deficiency anemia, unspecified: Secondary | ICD-10-CM | POA: Diagnosis not present

## 2018-01-23 ENCOUNTER — Other Ambulatory Visit: Payer: Self-pay | Admitting: Family Medicine

## 2018-01-23 DIAGNOSIS — Z1231 Encounter for screening mammogram for malignant neoplasm of breast: Secondary | ICD-10-CM

## 2018-01-25 DIAGNOSIS — J0101 Acute recurrent maxillary sinusitis: Secondary | ICD-10-CM | POA: Diagnosis not present

## 2018-01-25 DIAGNOSIS — R05 Cough: Secondary | ICD-10-CM | POA: Diagnosis not present

## 2018-01-29 DIAGNOSIS — R05 Cough: Secondary | ICD-10-CM | POA: Diagnosis not present

## 2018-01-29 DIAGNOSIS — J0101 Acute recurrent maxillary sinusitis: Secondary | ICD-10-CM | POA: Diagnosis not present

## 2018-02-12 ENCOUNTER — Ambulatory Visit
Admission: RE | Admit: 2018-02-12 | Discharge: 2018-02-12 | Disposition: A | Discharge status: Home / Self Care | Payer: Medicare Other | Source: Ambulatory Visit | Attending: Family Medicine | Admitting: Family Medicine

## 2018-02-12 DIAGNOSIS — Z1231 Encounter for screening mammogram for malignant neoplasm of breast: Secondary | ICD-10-CM | POA: Diagnosis not present

## 2018-02-19 DIAGNOSIS — H25013 Cortical age-related cataract, bilateral: Secondary | ICD-10-CM | POA: Diagnosis not present

## 2018-02-19 DIAGNOSIS — E119 Type 2 diabetes mellitus without complications: Secondary | ICD-10-CM | POA: Diagnosis not present

## 2018-02-19 DIAGNOSIS — H5203 Hypermetropia, bilateral: Secondary | ICD-10-CM | POA: Diagnosis not present

## 2018-02-19 DIAGNOSIS — H2513 Age-related nuclear cataract, bilateral: Secondary | ICD-10-CM | POA: Diagnosis not present

## 2018-03-19 DIAGNOSIS — E782 Mixed hyperlipidemia: Secondary | ICD-10-CM | POA: Diagnosis not present

## 2018-03-19 DIAGNOSIS — D519 Vitamin B12 deficiency anemia, unspecified: Secondary | ICD-10-CM | POA: Diagnosis not present

## 2018-03-19 DIAGNOSIS — E1129 Type 2 diabetes mellitus with other diabetic kidney complication: Secondary | ICD-10-CM | POA: Diagnosis not present

## 2018-03-19 DIAGNOSIS — E1165 Type 2 diabetes mellitus with hyperglycemia: Secondary | ICD-10-CM | POA: Diagnosis not present

## 2018-03-19 DIAGNOSIS — E1122 Type 2 diabetes mellitus with diabetic chronic kidney disease: Secondary | ICD-10-CM | POA: Diagnosis not present

## 2018-03-21 ENCOUNTER — Other Ambulatory Visit: Payer: Self-pay

## 2018-03-21 DIAGNOSIS — I10 Essential (primary) hypertension: Secondary | ICD-10-CM | POA: Diagnosis not present

## 2018-03-21 DIAGNOSIS — E782 Mixed hyperlipidemia: Secondary | ICD-10-CM | POA: Diagnosis not present

## 2018-03-21 DIAGNOSIS — N183 Chronic kidney disease, stage 3 (moderate): Secondary | ICD-10-CM | POA: Diagnosis not present

## 2018-03-21 DIAGNOSIS — Z23 Encounter for immunization: Secondary | ICD-10-CM | POA: Diagnosis not present

## 2018-03-21 DIAGNOSIS — E1122 Type 2 diabetes mellitus with diabetic chronic kidney disease: Secondary | ICD-10-CM | POA: Diagnosis not present

## 2018-03-21 DIAGNOSIS — K219 Gastro-esophageal reflux disease without esophagitis: Secondary | ICD-10-CM | POA: Diagnosis not present

## 2018-03-21 NOTE — Patient Outreach (Signed)
[facility]) Care Management  03/21/2018  Carolyn [name] [birth year] [id number]   Medication Adherence call to Carolyn [name] patient is showing past due on Pravastatin 20 mg. spoke with patient she said she is only taking 1 tablet once a week. and she still has medication for another month. Carolyn [name] is showing past due under [place].  [facility] Management Direct Dial [phone]  Fax [phone] [email or web address]

## 2018-03-23 DIAGNOSIS — M1712 Unilateral primary osteoarthritis, left knee: Secondary | ICD-10-CM | POA: Diagnosis not present

## 2018-03-23 DIAGNOSIS — M1711 Unilateral primary osteoarthritis, right knee: Secondary | ICD-10-CM | POA: Diagnosis not present

## 2018-04-03 DIAGNOSIS — M791 Myalgia, unspecified site: Secondary | ICD-10-CM | POA: Diagnosis not present

## 2018-04-10 DIAGNOSIS — M545 Low back pain: Secondary | ICD-10-CM | POA: Diagnosis not present

## 2018-04-26 ENCOUNTER — Other Ambulatory Visit (HOSPITAL_COMMUNITY): Payer: Self-pay | Admitting: *Deleted

## 2018-04-26 NOTE — Progress Notes (Signed)
LOV DR DANIEL 03-21-18 ON CHART MEDICAL CLEARANCE NOTE DR Quillian Quince 03-28-18 ON CHART FOR 05-07-18 SURGERY HEMAGLOBIN A1C 03-19-18 [facility] ON CHART

## 2018-04-26 NOTE — Patient Instructions (Addendum)
Carolyn [name]  04/26/2018   Your procedure is scheduled on: 05-07-18  Report to [facility] Main  Entrance  Report to admitting at 530 AM    Call this number if you have problems the morning of surgery [phone]   Remember: Do not eat food  Or drink liquids :After Midnight.. BRUSH YOUR TEETH MORNING OF SURGERY AND RINSE YOUR MOUTH OUT, NO CHEWING GUM CANDY OR MINTS.      Take these medicines the morning of surgery with A SIP OF WATER: citalopram (celexa), loratadine (claritin), systane eye drop, omeprazole (prilosec)  DO NOT TAKE ANY DIABETIC MEDICATIONS DAY OF YOUR SURGERY                 How to Manage Your Diabetes Before and After Surgery  Why is it important to control my blood sugar before and after surgery? . Improving blood sugar levels before and after surgery helps healing and can limit problems. . A way of improving blood sugar control is eating a healthy diet by: o  Eating less sugar and carbohydrates o  Increasing activity/exercise o  Talking with your doctor about reaching your blood sugar goals . High blood sugars (greater than 180 mg/dL) can raise your risk of infections and slow your recovery, so you will need to focus on controlling your diabetes during the weeks before surgery. . Make sure that the doctor who takes care of your diabetes knows about your planned surgery including the date and location.  How do I manage my blood sugar before surgery? . Check your blood sugar at least 4 times a day, starting 2 days before surgery, to make sure that the level is not too high or low. o Check your blood sugar the morning of your surgery when you wake up and every 2 hours until you get to the Short Stay unit. . If your blood sugar is less than 70 mg/dL, you will need to treat for low blood sugar: o Do not take insulin. o Treat a low blood sugar (less than 70 mg/dL) with  cup of clear juice (cranberry or apple), 4 glucose tablets, OR glucose  gel. o Recheck blood sugar in 15 minutes after treatment (to make sure it is greater than 70 mg/dL). If your blood sugar is not greater than 70 mg/dL on recheck, call [phone] for further instructions. . Report your blood sugar to the short stay nurse when you get to Short Stay.  . If you are admitted to the hospital after surgery: o Your blood sugar will be checked by the staff and you will probably be given insulin after surgery (instead of oral diabetes medicines) to make sure you have good blood sugar levels. o The goal for blood sugar control after surgery is 80-180 mg/dL.   WHAT DO I DO ABOUT MY DIABETES MEDICATION?  Marland Kitchen Do not take oral diabetes medicines (pills) the morning of surgery.  . THE DAY  BEFORE SURGERY TAKE YOUR METFORMIN AS USUAL    . THE MORNING OF SURGERY DO NOT TAKE METFORMIN      Patient Signature:  Date:   Nurse Signature:  Date:   Reviewed and Endorsed by [facility] Patient Education Committee, August 2015              You may not have any metal on your body including hair pins and  piercings  Do not wear jewelry, make-up, lotions, powders or perfumes, deodorant             Do not wear nail polish.  Do not shave  48 hours prior to surgery.                 Do not bring valuables to the hospital. Page.  Contacts, dentures or bridgework may not be worn into surgery.  Leave suitcase in the car. After surgery it may be brought to your room.                 Please read over the following fact sheets you were given: _____________________________________________________________________  [facility] - Preparing for Surgery Before surgery, you can play an important role.  Because skin is not sterile, your skin needs to be as free of germs as possible.  You can reduce the number of germs on your skin by washing with CHG (chlorahexidine gluconate) soap before surgery.  CHG is an antiseptic  cleaner which kills germs and bonds with the skin to continue killing germs even after washing. Please DO NOT use if you have an allergy to CHG or antibacterial soaps.  If your skin becomes reddened/irritated stop using the CHG and inform your nurse when you arrive at Short Stay. Do not shave (including legs and underarms) for at least 48 hours prior to the first CHG shower.  You may shave your face/neck. Please follow these instructions carefully:  1.  Shower with CHG Soap the night before surgery and the  morning of Surgery.  2.  If you choose to wash your hair, wash your hair first as usual with your  normal  shampoo.  3.  After you shampoo, rinse your hair and body thoroughly to remove the  shampoo.                           4.  Use CHG as you would any other liquid soap.  You can apply chg directly  to the skin and wash                       Gently with a scrungie or clean washcloth.  5.  Apply the CHG Soap to your body ONLY FROM THE NECK DOWN.   Do not use on face/ open                           Wound or open sores. Avoid contact with eyes, ears mouth and genitals (private parts).                       Wash face,  Genitals (private parts) with your normal soap.             6.  Wash thoroughly, paying special attention to the area where your surgery  will be performed.  7.  Thoroughly rinse your body with warm water from the neck down.  8.  DO NOT shower/wash with your normal soap after using and rinsing off  the CHG Soap.                9.  Pat yourself dry with a clean towel.            10.  Wear clean pajamas.            11.  Place clean sheets on your bed the night of your first shower and do not  sleep with pets. Day of Surgery : Do not apply any lotions/deodorants the morning of surgery.  Please wear clean clothes to the hospital/surgery center.  FAILURE TO FOLLOW THESE INSTRUCTIONS MAY RESULT IN THE CANCELLATION OF YOUR SURGERY PATIENT  SIGNATURE_________________________________  NURSE SIGNATURE__________________________________  ________________________________________________________________________   Carolyn [name]  An incentive spirometer is a tool that can help keep your lungs clear and active. This tool measures how well you are filling your lungs with each breath. Taking long deep breaths may help reverse or decrease the chance of developing breathing (pulmonary) problems (especially infection) following:  A long period of time when you are unable to move or be active. BEFORE THE PROCEDURE   If the spirometer includes an indicator to show your best effort, your nurse or respiratory therapist will set it to a desired goal.  If possible, sit up straight or lean slightly forward. Try not to slouch.  Hold the incentive spirometer in an upright position. INSTRUCTIONS FOR USE  1. Sit on the edge of your bed if possible, or sit up as far as you can in bed or on a chair. 2. Hold the incentive spirometer in an upright position. 3. Breathe out normally. 4. Place the mouthpiece in your mouth and seal your lips tightly around it. 5. Breathe in slowly and as deeply as possible, raising the piston or the ball toward the top of the column. 6. Hold your breath for 3-5 seconds or for as long as possible. Allow the piston or ball to fall to the bottom of the column. 7. Remove the mouthpiece from your mouth and breathe out normally. 8. Rest for a few seconds and repeat Steps 1 through 7 at least 10 times every 1-2 hours when you are awake. Take your time and take a few normal breaths between deep breaths. 9. The spirometer may include an indicator to show your best effort. Use the indicator as a goal to work toward during each repetition. 10. After each set of 10 deep breaths, practice coughing to be sure your lungs are clear. If you have an incision (the cut made at the time of surgery), support your incision when coughing  by placing a pillow or rolled up towels firmly against it. Once you are able to get out of bed, walk around indoors and cough well. You may stop using the incentive spirometer when instructed by your caregiver.  RISKS AND COMPLICATIONS  Take your time so you do not get dizzy or light-headed.  If you are in pain, you may need to take or ask for pain medication before doing incentive spirometry. It is harder to take a deep breath if you are having pain. AFTER USE  Rest and breathe slowly and easily.  It can be helpful to keep track of a log of your progress. Your caregiver can provide you with a simple table to help with this. If you are using the spirometer at home, follow these instructions: [place] IF:   You are having difficultly using the spirometer.  You have trouble using the spirometer as often as instructed.  Your pain medication is not giving enough relief while using the spirometer.  You develop fever of 100.5 F (38.1 C) or higher. SEEK IMMEDIATE MEDICAL CARE IF:   You cough up bloody sputum that  had not been present before.  You develop fever of 102 F (38.9 C) or greater.  You develop worsening pain at or near the incision site. MAKE SURE YOU:   Understand these instructions.  Will watch your condition.  Will get help right away if you are not doing well or get worse. Document Released: 12/05/2006 Document Revised: 10/17/2011 Document Reviewed: 02/05/2007 ExitCare Patient Information 2014 ExitCare, [place].   ________________________________________________________________________  WHAT IS A BLOOD TRANSFUSION? Blood Transfusion Information  A transfusion is the replacement of blood or some of its parts. Blood is made up of multiple cells which provide different functions.  Red blood cells carry oxygen and are used for blood loss replacement.  White blood cells fight against infection.  Platelets control bleeding.  Plasma helps clot  blood.  Other blood products are available for specialized needs, such as hemophilia or other clotting disorders. BEFORE THE TRANSFUSION  Who gives blood for transfusions?   Healthy volunteers who are fully evaluated to make sure their blood is safe. This is blood bank blood. Transfusion therapy is the safest it has ever been in the practice of medicine. Before blood is taken from a donor, a complete history is taken to make sure that person has no history of diseases nor engages in risky social behavior (examples are intravenous drug use or sexual activity with multiple partners). The donor's travel history is screened to minimize risk of transmitting infections, such as malaria. The donated blood is tested for signs of infectious diseases, such as HIV and hepatitis. The blood is then tested to be sure it is compatible with you in order to minimize the chance of a transfusion reaction. If you or a relative donates blood, this is often done in anticipation of surgery and is not appropriate for emergency situations. It takes many days to process the donated blood. RISKS AND COMPLICATIONS Although transfusion therapy is very safe and saves many lives, the main dangers of transfusion include:   Getting an infectious disease.  Developing a transfusion reaction. This is an allergic reaction to something in the blood you were given. Every precaution is taken to prevent this. The decision to have a blood transfusion has been considered carefully by your caregiver before blood is given. Blood is not given unless the benefits outweigh the risks. AFTER THE TRANSFUSION  Right after receiving a blood transfusion, you will usually feel much better and more energetic. This is especially true if your red blood cells have gotten low (anemic). The transfusion raises the level of the red blood cells which carry oxygen, and this usually causes an energy increase.  The nurse administering the transfusion will monitor  you carefully for complications. HOME CARE INSTRUCTIONS  No special instructions are needed after a transfusion. You may find your energy is better. Speak with your caregiver about any limitations on activity for underlying diseases you may have. SEEK MEDICAL CARE IF:   Your condition is not improving after your transfusion.  You develop redness or irritation at the intravenous (IV) site. SEEK IMMEDIATE MEDICAL CARE IF:  Any of the following symptoms occur over the next 12 hours:  Shaking chills.  You have a temperature by mouth above 102 F (38.9 C), not controlled by medicine.  Chest, back, or muscle pain.  People around you feel you are not acting correctly or are confused.  Shortness of breath or difficulty breathing.  Dizziness and fainting.  You get a rash or develop hives.  You have a decrease in  urine output.  Your urine turns a dark color or changes to pink, red, or brown. Any of the following symptoms occur over the next 10 days:  You have a temperature by mouth above 102 F (38.9 C), not controlled by medicine.  Shortness of breath.  Weakness after normal activity.  The white part of the eye turns yellow (jaundice).  You have a decrease in the amount of urine or are urinating less often.  Your urine turns a dark color or changes to pink, red, or brown. Document Released: 07/22/2000 Document Revised: 10/17/2011 Document Reviewed: 03/10/2008 [facility] Patient Information 2014 [place], [place].  _______________________________________________________________________

## 2018-04-30 ENCOUNTER — Encounter (HOSPITAL_COMMUNITY): Payer: Self-pay

## 2018-04-30 ENCOUNTER — Other Ambulatory Visit: Payer: Self-pay

## 2018-04-30 ENCOUNTER — Encounter (HOSPITAL_COMMUNITY)
Admission: RE | Admit: 2018-04-30 | Discharge: 2018-04-30 | Disposition: A | Discharge status: Home / Self Care | Payer: Medicare Other | Source: Ambulatory Visit | Attending: Orthopedic Surgery | Admitting: Orthopedic Surgery

## 2018-04-30 DIAGNOSIS — Z01818 Encounter for other preprocedural examination: Secondary | ICD-10-CM | POA: Insufficient documentation

## 2018-04-30 DIAGNOSIS — I10 Essential (primary) hypertension: Secondary | ICD-10-CM | POA: Diagnosis not present

## 2018-04-30 LAB — CBC
HCT: 39.6 % (ref 36.0–46.0)
Hemoglobin: 13.1 g/dL (ref 12.0–15.0)
MCH: 30.2 pg (ref 26.0–34.0)
MCHC: 33.1 g/dL (ref 30.0–36.0)
MCV: 91.2 fL (ref 78.0–100.0)
Platelets: 272 10*3/uL (ref 150–400)
RBC: 4.34 MIL/uL (ref 3.87–5.11)
RDW: 12.8 % (ref 11.5–15.5)
WBC: 5.3 10*3/uL (ref 4.0–10.5)

## 2018-04-30 LAB — COMPREHENSIVE METABOLIC PANEL
ALT: 25 U/L (ref 0–44)
AST: 22 U/L (ref 15–41)
Albumin: 4.1 g/dL (ref 3.5–5.0)
Alkaline Phosphatase: 69 U/L (ref 38–126)
Anion gap: 8 (ref 5–15)
BUN: 17 mg/dL (ref 8–23)
CO2: 31 mmol/L (ref 22–32)
Calcium: 9.2 mg/dL (ref 8.9–10.3)
Chloride: 96 mmol/L — ABNORMAL LOW (ref 98–111)
Creatinine, Ser: 0.91 mg/dL (ref 0.44–1.00)
GFR calc Af Amer: >60 mL/min (ref >60)
GFR calc non Af Amer: 59 mL/min — ABNORMAL LOW (ref >60)
Glucose, Bld: 122 mg/dL — ABNORMAL HIGH (ref 70–99)
Potassium: 3.7 mmol/L (ref 3.5–5.1)
Sodium: 135 mmol/L (ref 135–145)
Total Bilirubin: 0.6 mg/dL (ref 0.3–1.2)
Total Protein: 7.1 g/dL (ref 6.5–8.1)

## 2018-04-30 LAB — GLUCOSE, CAPILLARY: Glucose-Capillary: 133 mg/dL — ABNORMAL HIGH (ref 70–99)

## 2018-04-30 LAB — PROTIME-INR
INR: 0.84
Prothrombin Time: 11.5 seconds (ref 11.4–15.2)

## 2018-04-30 LAB — SURGICAL PCR SCREEN
MRSA, PCR: NEGATIVE
Staphylococcus aureus: NEGATIVE

## 2018-04-30 LAB — APTT: aPTT: 27 seconds (ref 24–36)

## 2018-04-30 NOTE — H&P (Signed)
TOTAL KNEE ADMISSION H&P  Patient is being admitted for left total knee arthroplasty.  Subjective:  Chief Complaint:left knee pain.  HPI: Carolyn [name], 77 y.o. female, has a history of pain and functional disability in the left knee due to arthritis and has failed non-surgical conservative treatments for greater than 12 weeks to includecorticosteriod injections, viscosupplementation injections and activity modification.  Onset of symptoms was abrupt, starting 1 years ago with rapidly worsening course since that time. The patient noted no past surgery on the left knee(s).  Patient currently rates pain in the left knee(s) at 7 out of 10 with activity. Patient has worsening of pain with activity and weight bearing, pain with passive range of motion and stiffness.  Patient has evidence of medial bone-on-bone arthritis with patellofemoral narrowing on her left knee by imaging studies. There is no active infection.  Patient Active Problem List   Diagnosis Date Noted  . Hyperparathyroidism, primary ([place]) 04/25/2017  . Falls 07/19/2016  . Precordial chest pain 12/23/2014  . Hypercalcemia 12/23/2014  . Diabetes ([place]) 10/23/2013  . HTN (hypertension) 10/23/2013   Past Medical History:  Diagnosis Date  . Anxiety   . Arthritis   . Complication of anesthesia    " my heart rate gets low"  . Depression   . Diabetes mellitus ([place])   . GERD (gastroesophageal reflux disease)   . Hepatic steatosis   . Hyperlipidemia   . Hyperparathyroidism, primary ([place])   . Hypertension   . IBS (irritable bowel syndrome)   . Internal hemorrhoid   . Tubular adenoma of colon 1999   colonoscopy  . Wears dentures   . Wears glasses     Past Surgical History:  Procedure Laterality Date  . ANKLE FRACTURE SURGERY Left   . BACK SURGERY  04/2011  . COLONOSCOPY W/ BIOPSIES AND POLYPECTOMY    . PARATHYROIDECTOMY Right 04/27/2017   Procedure: RIGHT PARATHYROIDECTOMY;  Surgeon: Armandina Gemma, MD;  Location: [facility];   Service: General;  Laterality: Right;  . TONSILLECTOMY    . VAGINAL HYSTERECTOMY  1964    No current facility-administered medications for this encounter.    Current Outpatient Medications  Medication Sig Dispense Refill Last Dose  . acetaminophen (TYLENOL) 500 MG tablet Take 1,000 mg by mouth every 6 (six) hours as needed for mild pain.     . celecoxib (CELEBREX) 200 MG capsule Take 200 mg by mouth daily.     . citalopram (CELEXA) 20 MG tablet Take 20 mg by mouth daily with breakfast.    04/27/2017 at 0430  . Liniments (SALONPAS PAIN RELIEF PATCH EX) Apply 1 patch topically daily as needed (pain).     Marland Kitchen loratadine (CLARITIN) 10 MG tablet Take 10 mg by mouth daily.     Marland Kitchen losartan-hydrochlorothiazide (HYZAAR) 100-12.5 MG per tablet Take 1 tablet by mouth daily with breakfast.    04/26/2017 at Unknown time  . metFORMIN (GLUCOPHAGE-XR) 500 MG 24 hr tablet Take 500-1,000 mg by mouth See admin instructions. Take 2 tablets in the morning, and 1 tablet in the evening.   04/26/2017 at Unknown time  . omeprazole (PRILOSEC) 20 MG capsule Take 20 mg by mouth daily before breakfast.    04/27/2017 at 0430  . Polyethyl Glycol-Propyl Glycol (SYSTANE) 0.4-0.3 % SOLN Place 1 drop into both eyes at bedtime.    04/27/2017 at Unknown time   Allergies  Allergen Reactions  . Sulfa Antibiotics Rash    Social History   Tobacco Use  . Smoking status:  Never Smoker  . Smokeless tobacco: Never Used  Substance Use Topics  . Alcohol use: No    Family History  Problem Relation Age of Onset  . Stomach cancer Sister   . Diabetes Sister   . Colon polyps Sister        hyperplastic  . High blood pressure Sister   . Colon cancer Neg Hx   . Breast cancer Neg Hx      Review of Systems  Constitutional: Negative for chills and fever.  HENT: Negative for congestion, sore throat and tinnitus.   Eyes: Negative for double vision, photophobia and pain.  Respiratory: Negative for cough, shortness of breath and wheezing.     Cardiovascular: Negative for chest pain, palpitations and orthopnea.  Gastrointestinal: Negative for heartburn, nausea and vomiting.  Genitourinary: Negative for dysuria, frequency and urgency.  Musculoskeletal: Positive for joint pain.  Neurological: Negative for dizziness, weakness and headaches.  Psychiatric/Behavioral: Negative for depression.    Objective:  Physical Exam  Well nourished and well developed. General: Alert and oriented x3, cooperative and pleasant, no acute distress. Head: normocephalic, atraumatic, neck supple. Eyes: EOMI. Respiratory: breath sounds clear in all fields, no wheezing, rales, or rhonchi. Cardiovascular: Regular rate and rhythm, no murmurs, gallops or rubs. Abdomen: non-tender to palpation and soft, normoactive bowel sounds. Musculoskeletal: Antalgic gait pattern favoring the left side without using assisted devices. Right Hip Exam: ROM: Normal without discomfort. There is no tenderness over the greater trochanter bursa. There is no pain on provocative testing of the hip. Right Knee Exam: No effusion. Range of motion is 0-125 degrees. Slight crepitus on range of motion of the knee. Positive medial joint line tenderness. No lateral joint line tenderness. Stable knee. Left Hip Exam: ROM: Normal without discomfort. There is no tenderness over the greater trochanter bursa. There is no pain on provocative testing of the hip. Left Knee Exam: No effusion. Range of motion is 5-120 degrees. Moderate crepitus on range of motion of the knee. Significant medial joint line tendernessNo lateral joint line tenderness. Stable knee. Calves soft and nontender. Motor function intact in LE. Strength 5/5 LE bilaterally. Neuro: Distal pulses 2+. Sensation to light touch intact in LE.  Vital signs in last 24 hours: Blood pressure: 148/82 mmHg Pulse: 72 bpm   Labs:   Estimated body mass index is 28.04 kg/m as calculated from the following:   Height as of 04/27/17: 5\' 7"   (1.702 m).   Weight as of 04/27/17: 81.2 kg.   Imaging Review Plain radiographs demonstrate severe degenerative joint disease of the left knee(s). The overall alignment isneutral. The bone quality appears to be adequate for age and reported activity level.   Preoperative templating of the joint replacement has been completed, documented, and submitted to the Operating Room personnel in order to optimize intra-operative equipment management.   Anticipated LOS equal to or greater than 2 midnights due to - Age 24 and older with one or more of the following:  - Obesity  - Expected need for hospital services (PT, OT, Nursing) required for safe  discharge  - Anticipated need for postoperative skilled nursing care or inpatient rehab  - Active co-morbidities: Diabetes OR   - Unanticipated findings during/Post Surgery: None  - Patient is a high risk of re-admission due to: None     Assessment/Plan:  End stage arthritis, left knee   The patient history, physical examination, clinical judgment of the provider and imaging studies are consistent with end stage degenerative joint disease of  the left knee(s) and total knee arthroplasty is deemed medically necessary. The treatment options including medical management, injection therapy arthroscopy and arthroplasty were discussed at length. The risks and benefits of total knee arthroplasty were presented and reviewed. The risks due to aseptic loosening, infection, stiffness, patella tracking problems, thromboembolic complications and other imponderables were discussed. The patient acknowledged the explanation, agreed to proceed with the plan and consent was signed. Patient is being admitted for inpatient treatment for surgery, pain control, PT, OT, prophylactic antibiotics, VTE prophylaxis, progressive ambulation and ADL's and discharge planning. The patient is planning to be discharged home with outpatient physical therapy.   Therapy Plans: outpatient  therapy at [facility] in [place] Disposition: Home with daughters Planned DVT Prophylaxis: Aspirin 325 mg BID DME needed: None PCP: Dr. Olena Heckle (medical clearance provided) TXA: IV Allergies: Sulfa (rash) HgbA1c: 6.9% Anesthesia Concerns: Nausea/vomiting  - Patient was instructed on what medications to stop prior to surgery. - Follow-up visit in 2 weeks with Dr. Wynelle Link - Begin physical therapy following surgery - Pre-operative lab work as pre-surgical testing - Prescriptions will be provided in hospital at time of discharge  Theresa Duty, PA-C Orthopedic Surgery [facility] [place]

## 2018-04-30 NOTE — Progress Notes (Signed)
Chart left with verna washington rn to follow up with pcr result.

## 2018-05-05 NOTE — Anesthesia Preprocedure Evaluation (Addendum)
Anesthesia Evaluation  Patient identified by MRN, date of birth, ID band Patient awake    Reviewed: Allergy & Precautions, NPO status , Patient's Chart, lab work & pertinent test results  Airway Mallampati: II  TM Distance: >3 FB Neck ROM: Full    Dental   Pulmonary neg pulmonary ROS,    breath sounds clear to auscultation       Cardiovascular hypertension, Pt. on medications  Rhythm:Regular Rate:Normal     Neuro/Psych negative neurological ROS     GI/Hepatic Neg liver ROS, GERD  ,  Endo/Other  diabetes, Type 2, Oral Hypoglycemic Agents  Renal/GU negative Renal ROS     Musculoskeletal  (+) Arthritis ,   Abdominal   Peds  Hematology negative hematology ROS (+)   Anesthesia Other Findings   Reproductive/Obstetrics                            Lab Results  Component Value Date   WBC 5.3 04/30/2018   HGB 13.1 04/30/2018   HCT 39.6 04/30/2018   MCV 91.2 04/30/2018   PLT 272 04/30/2018   Lab Results  Component Value Date   CREATININE 0.91 04/30/2018   BUN 17 04/30/2018   NA 135 04/30/2018   K 3.7 04/30/2018   CL 96 (L) 04/30/2018   CO2 31 04/30/2018   Lab Results  Component Value Date   INR 0.84 04/30/2018   INR 0.95 04/04/2011   INR 1.0 03/13/2008    Anesthesia Physical Anesthesia Plan  ASA: II  Anesthesia Plan: MAC and Spinal   Post-op Pain Management:  Regional for Post-op pain   Induction: Intravenous  PONV Risk Score and Plan: 2 and Ondansetron, Propofol infusion and Treatment may vary due to age or medical condition  Airway Management Planned: Natural Airway and Simple Face Mask  Additional Equipment:   Intra-op Plan:   Post-operative Plan:   Informed Consent: I have reviewed the patients History and Physical, chart, labs and discussed the procedure including the risks, benefits and alternatives for the proposed anesthesia with the patient or authorized  representative who has indicated his/her understanding and acceptance.     Plan Discussed with: CRNA  Anesthesia Plan Comments:        Anesthesia Quick Evaluation

## 2018-05-06 MED ORDER — BUPIVACAINE LIPOSOME 1.3 % IJ SUSP
20.0000 mL | Freq: Once | INTRAMUSCULAR | Status: DC
  Filled 2018-05-06: qty 20

## 2018-05-07 ENCOUNTER — Encounter (HOSPITAL_COMMUNITY)
Admission: RE | Disposition: A | Discharge status: Home / Self Care | Payer: Self-pay | Source: Home / Self Care | Attending: Orthopedic Surgery

## 2018-05-07 ENCOUNTER — Other Ambulatory Visit: Payer: Self-pay

## 2018-05-07 ENCOUNTER — Inpatient Hospital Stay (HOSPITAL_COMMUNITY)
Admission: RE | Admit: 2018-05-07 | Discharge: 2018-05-09 | DRG: 470 | Disposition: A | Discharge status: Home / Self Care | Payer: Medicare Other | Attending: Orthopedic Surgery | Admitting: Orthopedic Surgery

## 2018-05-07 ENCOUNTER — Inpatient Hospital Stay (HOSPITAL_COMMUNITY): Payer: Medicare Other | Admitting: Anesthesiology

## 2018-05-07 ENCOUNTER — Encounter (HOSPITAL_COMMUNITY): Payer: Self-pay | Admitting: *Deleted

## 2018-05-07 DIAGNOSIS — E119 Type 2 diabetes mellitus without complications: Secondary | ICD-10-CM | POA: Diagnosis present

## 2018-05-07 DIAGNOSIS — M1712 Unilateral primary osteoarthritis, left knee: Principal | ICD-10-CM | POA: Diagnosis present

## 2018-05-07 DIAGNOSIS — Z882 Allergy status to sulfonamides status: Secondary | ICD-10-CM

## 2018-05-07 DIAGNOSIS — Z973 Presence of spectacles and contact lenses: Secondary | ICD-10-CM

## 2018-05-07 DIAGNOSIS — E210 Primary hyperparathyroidism: Secondary | ICD-10-CM | POA: Diagnosis not present

## 2018-05-07 DIAGNOSIS — Z833 Family history of diabetes mellitus: Secondary | ICD-10-CM | POA: Diagnosis not present

## 2018-05-07 DIAGNOSIS — I10 Essential (primary) hypertension: Secondary | ICD-10-CM | POA: Diagnosis present

## 2018-05-07 DIAGNOSIS — Z90710 Acquired absence of both cervix and uterus: Secondary | ICD-10-CM

## 2018-05-07 DIAGNOSIS — Z79899 Other long term (current) drug therapy: Secondary | ICD-10-CM

## 2018-05-07 DIAGNOSIS — E785 Hyperlipidemia, unspecified: Secondary | ICD-10-CM | POA: Diagnosis not present

## 2018-05-07 DIAGNOSIS — Z972 Presence of dental prosthetic device (complete) (partial): Secondary | ICD-10-CM | POA: Diagnosis not present

## 2018-05-07 DIAGNOSIS — G8918 Other acute postprocedural pain: Secondary | ICD-10-CM | POA: Diagnosis not present

## 2018-05-07 DIAGNOSIS — K589 Irritable bowel syndrome without diarrhea: Secondary | ICD-10-CM | POA: Diagnosis present

## 2018-05-07 DIAGNOSIS — K760 Fatty (change of) liver, not elsewhere classified: Secondary | ICD-10-CM | POA: Diagnosis not present

## 2018-05-07 DIAGNOSIS — F329 Major depressive disorder, single episode, unspecified: Secondary | ICD-10-CM | POA: Diagnosis present

## 2018-05-07 DIAGNOSIS — M179 Osteoarthritis of knee, unspecified: Secondary | ICD-10-CM | POA: Diagnosis present

## 2018-05-07 DIAGNOSIS — M25762 Osteophyte, left knee: Secondary | ICD-10-CM | POA: Diagnosis present

## 2018-05-07 DIAGNOSIS — E669 Obesity, unspecified: Secondary | ICD-10-CM | POA: Diagnosis present

## 2018-05-07 DIAGNOSIS — Z6828 Body mass index (BMI) 28.0-28.9, adult: Secondary | ICD-10-CM

## 2018-05-07 DIAGNOSIS — Z7984 Long term (current) use of oral hypoglycemic drugs: Secondary | ICD-10-CM

## 2018-05-07 DIAGNOSIS — K219 Gastro-esophageal reflux disease without esophagitis: Secondary | ICD-10-CM | POA: Diagnosis not present

## 2018-05-07 DIAGNOSIS — F419 Anxiety disorder, unspecified: Secondary | ICD-10-CM | POA: Diagnosis present

## 2018-05-07 DIAGNOSIS — M1710 Unilateral primary osteoarthritis, unspecified knee: Secondary | ICD-10-CM

## 2018-05-07 HISTORY — PX: TOTAL KNEE ARTHROPLASTY: SHX125

## 2018-05-07 LAB — TYPE AND SCREEN
ABO/RH(D): O POS
Antibody Screen: NEGATIVE

## 2018-05-07 LAB — GLUCOSE, CAPILLARY
Glucose-Capillary: 218 mg/dL — ABNORMAL HIGH (ref 70–99)
Glucose-Capillary: 149 mg/dL — ABNORMAL HIGH (ref 70–99)
Glucose-Capillary: 200 mg/dL — ABNORMAL HIGH (ref 70–99)
Glucose-Capillary: 207 mg/dL — ABNORMAL HIGH (ref 70–99)
Glucose-Capillary: 162 mg/dL — ABNORMAL HIGH (ref 70–99)

## 2018-05-07 SURGERY — ARTHROPLASTY, KNEE, TOTAL
Anesthesia: General | Site: Knee | Laterality: Left

## 2018-05-07 MED ORDER — ASPIRIN EC 325 MG PO TBEC
325.0000 mg | DELAYED_RELEASE_TABLET | Freq: Two times a day (BID) | ORAL | Status: DC
  Administered 2018-05-08 – 2018-05-09 (×3): 325 mg via ORAL
  Filled 2018-05-07 (×3): qty 1

## 2018-05-07 MED ORDER — HYDROMORPHONE HCL 1 MG/ML IJ SOLN
0.5000 mg | INTRAMUSCULAR | Status: DC | PRN
  Administered 2018-05-08 (×2): 0.5 mg via INTRAVENOUS
  Filled 2018-05-07 (×2): qty 1

## 2018-05-07 MED ORDER — POLYVINYL ALCOHOL 1.4 % OP SOLN
1.0000 [drp] | Freq: Every day | OPHTHALMIC | Status: DC
  Administered 2018-05-07 – 2018-05-08 (×2): 1 [drp] via OPHTHALMIC
  Filled 2018-05-07: qty 15

## 2018-05-07 MED ORDER — DEXAMETHASONE SODIUM PHOSPHATE 10 MG/ML IJ SOLN
INTRAMUSCULAR | Status: DC | PRN
  Administered 2018-05-07: 4 mg via INTRAVENOUS

## 2018-05-07 MED ORDER — CEFAZOLIN SODIUM-DEXTROSE 2-4 GM/100ML-% IV SOLN
2.0000 g | INTRAVENOUS | Status: AC
  Administered 2018-05-07: 2 g via INTRAVENOUS
  Filled 2018-05-07: qty 100

## 2018-05-07 MED ORDER — FENTANYL CITRATE (PF) 100 MCG/2ML IJ SOLN
INTRAMUSCULAR | Status: DC | PRN
  Administered 2018-05-07: 50 ug via INTRAVENOUS
  Administered 2018-05-07: 100 ug via INTRAVENOUS
  Administered 2018-05-07 (×2): 50 ug via INTRAVENOUS

## 2018-05-07 MED ORDER — PANTOPRAZOLE SODIUM 40 MG PO TBEC
40.0000 mg | DELAYED_RELEASE_TABLET | Freq: Every day | ORAL | Status: DC
  Administered 2018-05-08 – 2018-05-09 (×2): 40 mg via ORAL
  Filled 2018-05-07 (×2): qty 1

## 2018-05-07 MED ORDER — METOCLOPRAMIDE HCL 5 MG PO TABS: 5.0000 mg | ORAL_TABLET | Freq: Three times a day (TID) | ORAL | Status: DC | PRN

## 2018-05-07 MED ORDER — LACTATED RINGERS IV SOLN
INTRAVENOUS | Status: DC
  Administered 2018-05-07 (×2): via INTRAVENOUS

## 2018-05-07 MED ORDER — PHENYLEPHRINE 40 MCG/ML (10ML) SYRINGE FOR IV PUSH (FOR BLOOD PRESSURE SUPPORT)
PREFILLED_SYRINGE | INTRAVENOUS | Status: DC | PRN
  Administered 2018-05-07 (×3): 80 ug via INTRAVENOUS

## 2018-05-07 MED ORDER — BISACODYL 10 MG RE SUPP: 10.0000 mg | Freq: Every day | RECTAL | Status: DC | PRN

## 2018-05-07 MED ORDER — METHOCARBAMOL 500 MG IVPB - SIMPLE MED
500.0000 mg | Freq: Four times a day (QID) | INTRAVENOUS | Status: DC | PRN
  Administered 2018-05-07: 500 mg via INTRAVENOUS
  Filled 2018-05-07: qty 50

## 2018-05-07 MED ORDER — SODIUM CHLORIDE 0.9 % IJ SOLN
INTRAMUSCULAR | Status: DC | PRN
  Administered 2018-05-07: 60 mL

## 2018-05-07 MED ORDER — LOSARTAN POTASSIUM 50 MG PO TABS
100.0000 mg | ORAL_TABLET | Freq: Every day | ORAL | Status: DC
  Administered 2018-05-08 – 2018-05-09 (×2): 100 mg via ORAL
  Filled 2018-05-07 (×2): qty 2

## 2018-05-07 MED ORDER — PROPOFOL 10 MG/ML IV BOLUS
INTRAVENOUS | Status: AC
  Filled 2018-05-07: qty 40

## 2018-05-07 MED ORDER — GLYCOPYRROLATE PF 0.2 MG/ML IJ SOSY
PREFILLED_SYRINGE | INTRAMUSCULAR | Status: AC
  Filled 2018-05-07: qty 1

## 2018-05-07 MED ORDER — LOSARTAN POTASSIUM-HCTZ 100-12.5 MG PO TABS: 1.0000 | ORAL_TABLET | Freq: Every day | ORAL | Status: DC

## 2018-05-07 MED ORDER — SODIUM CHLORIDE 0.9 % IR SOLN
Status: DC | PRN
  Administered 2018-05-07: 1000 mL

## 2018-05-07 MED ORDER — MENTHOL 3 MG MT LOZG
1.0000 | LOZENGE | OROMUCOSAL | Status: DC | PRN
  Filled 2018-05-07: qty 9

## 2018-05-07 MED ORDER — SODIUM CHLORIDE 0.9 % IJ SOLN
INTRAMUSCULAR | Status: AC
  Filled 2018-05-07: qty 50

## 2018-05-07 MED ORDER — FENTANYL CITRATE (PF) 100 MCG/2ML IJ SOLN
25.0000 ug | INTRAMUSCULAR | Status: DC | PRN
  Administered 2018-05-07: 50 ug via INTRAVENOUS
  Administered 2018-05-07: 25 ug via INTRAVENOUS
  Administered 2018-05-07: 0.25 ug via INTRAVENOUS

## 2018-05-07 MED ORDER — CHLORHEXIDINE GLUCONATE 4 % EX LIQD: 60.0000 mL | Freq: Once | CUTANEOUS | Status: DC

## 2018-05-07 MED ORDER — CEFAZOLIN SODIUM-DEXTROSE 2-4 GM/100ML-% IV SOLN
2.0000 g | Freq: Four times a day (QID) | INTRAVENOUS | Status: AC
  Administered 2018-05-07 (×2): 2 g via INTRAVENOUS
  Filled 2018-05-07 (×2): qty 100

## 2018-05-07 MED ORDER — CLONIDINE HCL (ANALGESIA) 100 MCG/ML EP SOLN
EPIDURAL | Status: DC | PRN
  Administered 2018-05-07: 50 ug

## 2018-05-07 MED ORDER — INSULIN ASPART 100 UNIT/ML ~~LOC~~ SOLN
0.0000 [IU] | Freq: Three times a day (TID) | SUBCUTANEOUS | Status: DC
  Administered 2018-05-07 – 2018-05-08 (×2): 5 [IU] via SUBCUTANEOUS
  Administered 2018-05-08: 2 [IU] via SUBCUTANEOUS
  Administered 2018-05-09: 3 [IU] via SUBCUTANEOUS

## 2018-05-07 MED ORDER — DOCUSATE SODIUM 100 MG PO CAPS
100.0000 mg | ORAL_CAPSULE | Freq: Two times a day (BID) | ORAL | Status: DC
  Administered 2018-05-07 – 2018-05-09 (×4): 100 mg via ORAL
  Filled 2018-05-07 (×4): qty 1

## 2018-05-07 MED ORDER — ONDANSETRON HCL 4 MG/2ML IJ SOLN
INTRAMUSCULAR | Status: DC | PRN
  Administered 2018-05-07: 4 mg via INTRAVENOUS

## 2018-05-07 MED ORDER — ACETAMINOPHEN 10 MG/ML IV SOLN
1000.0000 mg | Freq: Four times a day (QID) | INTRAVENOUS | Status: DC
  Administered 2018-05-07: 1000 mg via INTRAVENOUS
  Filled 2018-05-07: qty 100

## 2018-05-07 MED ORDER — DIPHENHYDRAMINE HCL 12.5 MG/5ML PO ELIX: 12.5000 mg | ORAL_SOLUTION | ORAL | Status: DC | PRN

## 2018-05-07 MED ORDER — TRANEXAMIC ACID 1000 MG/10ML IV SOLN
1000.0000 mg | INTRAVENOUS | Status: AC
  Administered 2018-05-07: 1000 mg via INTRAVENOUS
  Filled 2018-05-07: qty 10

## 2018-05-07 MED ORDER — LIDOCAINE 2% (20 MG/ML) 5 ML SYRINGE
INTRAMUSCULAR | Status: AC
  Filled 2018-05-07: qty 5

## 2018-05-07 MED ORDER — ONDANSETRON HCL 4 MG PO TABS: 4.0000 mg | ORAL_TABLET | Freq: Four times a day (QID) | ORAL | Status: DC | PRN

## 2018-05-07 MED ORDER — DEXAMETHASONE SODIUM PHOSPHATE 10 MG/ML IJ SOLN
10.0000 mg | Freq: Once | INTRAMUSCULAR | Status: AC
  Administered 2018-05-08: 10 mg via INTRAVENOUS
  Filled 2018-05-07: qty 1

## 2018-05-07 MED ORDER — METHOCARBAMOL 500 MG IVPB - SIMPLE MED
INTRAVENOUS | Status: AC
  Filled 2018-05-07: qty 50

## 2018-05-07 MED ORDER — HYDROCHLOROTHIAZIDE 12.5 MG PO CAPS
12.5000 mg | ORAL_CAPSULE | Freq: Every day | ORAL | Status: DC
  Administered 2018-05-08 – 2018-05-09 (×2): 12.5 mg via ORAL
  Filled 2018-05-07 (×2): qty 1

## 2018-05-07 MED ORDER — SODIUM CHLORIDE 0.9 % IV SOLN
INTRAVENOUS | Status: DC
  Administered 2018-05-08 (×2): via INTRAVENOUS

## 2018-05-07 MED ORDER — EPHEDRINE SULFATE-NACL 50-0.9 MG/10ML-% IV SOSY
PREFILLED_SYRINGE | INTRAVENOUS | Status: DC | PRN
  Administered 2018-05-07: 10 mg via INTRAVENOUS
  Administered 2018-05-07: 5 mg via INTRAVENOUS
  Administered 2018-05-07: 10 mg via INTRAVENOUS

## 2018-05-07 MED ORDER — SUCCINYLCHOLINE CHLORIDE 20 MG/ML IJ SOLN
INTRAMUSCULAR | Status: DC | PRN
  Administered 2018-05-07: 100 mg via INTRAVENOUS

## 2018-05-07 MED ORDER — POLYETHYLENE GLYCOL 3350 17 G PO PACK: 17.0000 g | PACK | Freq: Every day | ORAL | Status: DC | PRN

## 2018-05-07 MED ORDER — FENTANYL CITRATE (PF) 100 MCG/2ML IJ SOLN
INTRAMUSCULAR | Status: AC
  Filled 2018-05-07: qty 2

## 2018-05-07 MED ORDER — ACETAMINOPHEN 500 MG PO TABS
1000.0000 mg | ORAL_TABLET | Freq: Four times a day (QID) | ORAL | Status: AC
  Administered 2018-05-07 – 2018-05-08 (×3): 1000 mg via ORAL
  Filled 2018-05-07 (×3): qty 2

## 2018-05-07 MED ORDER — CITALOPRAM HYDROBROMIDE 20 MG PO TABS
20.0000 mg | ORAL_TABLET | Freq: Every day | ORAL | Status: DC
  Administered 2018-05-08 – 2018-05-09 (×2): 20 mg via ORAL
  Filled 2018-05-07 (×2): qty 1

## 2018-05-07 MED ORDER — FENTANYL CITRATE (PF) 250 MCG/5ML IJ SOLN
INTRAMUSCULAR | Status: AC
  Filled 2018-05-07: qty 5

## 2018-05-07 MED ORDER — LORATADINE 10 MG PO TABS
10.0000 mg | ORAL_TABLET | Freq: Every day | ORAL | Status: DC
  Administered 2018-05-08 – 2018-05-09 (×2): 10 mg via ORAL
  Filled 2018-05-07 (×2): qty 1

## 2018-05-07 MED ORDER — OXYCODONE HCL 5 MG PO TABS
5.0000 mg | ORAL_TABLET | ORAL | Status: DC | PRN
  Administered 2018-05-07 – 2018-05-08 (×5): 5 mg via ORAL
  Administered 2018-05-08: 10 mg via ORAL
  Filled 2018-05-07: qty 1
  Filled 2018-05-07: qty 2
  Filled 2018-05-07 (×2): qty 1
  Filled 2018-05-07: qty 2
  Filled 2018-05-07: qty 1

## 2018-05-07 MED ORDER — ROPIVACAINE HCL 7.5 MG/ML IJ SOLN
INTRAMUSCULAR | Status: DC | PRN
  Administered 2018-05-07: 20 mL via PERINEURAL

## 2018-05-07 MED ORDER — DEXAMETHASONE SODIUM PHOSPHATE 10 MG/ML IJ SOLN: 8.0000 mg | Freq: Once | INTRAMUSCULAR | Status: DC

## 2018-05-07 MED ORDER — LIDOCAINE 2% (20 MG/ML) 5 ML SYRINGE
INTRAMUSCULAR | Status: DC | PRN
  Administered 2018-05-07: 20 mg via INTRAVENOUS

## 2018-05-07 MED ORDER — METOCLOPRAMIDE HCL 5 MG/ML IJ SOLN
5.0000 mg | Freq: Three times a day (TID) | INTRAMUSCULAR | Status: DC | PRN
  Administered 2018-05-07: 10 mg via INTRAVENOUS
  Filled 2018-05-07: qty 2

## 2018-05-07 MED ORDER — PHENOL 1.4 % MT LIQD
1.0000 | OROMUCOSAL | Status: DC | PRN
  Filled 2018-05-07: qty 177

## 2018-05-07 MED ORDER — SODIUM CHLORIDE 0.9 % IJ SOLN
INTRAMUSCULAR | Status: AC
  Filled 2018-05-07: qty 10

## 2018-05-07 MED ORDER — PROPOFOL 10 MG/ML IV BOLUS
INTRAVENOUS | Status: DC | PRN
  Administered 2018-05-07: 150 mg via INTRAVENOUS

## 2018-05-07 MED ORDER — BUPIVACAINE LIPOSOME 1.3 % IJ SUSP
INTRAMUSCULAR | Status: DC | PRN
  Administered 2018-05-07: 20 mL

## 2018-05-07 MED ORDER — STERILE WATER FOR IRRIGATION IR SOLN
Status: DC | PRN
  Administered 2018-05-07: 2000 mL

## 2018-05-07 MED ORDER — METHOCARBAMOL 500 MG PO TABS
500.0000 mg | ORAL_TABLET | Freq: Four times a day (QID) | ORAL | Status: DC | PRN
  Administered 2018-05-07 – 2018-05-08 (×3): 500 mg via ORAL
  Filled 2018-05-07 (×4): qty 1

## 2018-05-07 MED ORDER — FLEET ENEMA 7-19 GM/118ML RE ENEM: 1.0000 | ENEMA | Freq: Once | RECTAL | Status: DC | PRN

## 2018-05-07 MED ORDER — TRANEXAMIC ACID 1000 MG/10ML IV SOLN
1000.0000 mg | Freq: Once | INTRAVENOUS | Status: AC
  Administered 2018-05-07: 1000 mg via INTRAVENOUS
  Filled 2018-05-07: qty 1000

## 2018-05-07 MED ORDER — ONDANSETRON HCL 4 MG/2ML IJ SOLN: 4.0000 mg | Freq: Once | INTRAMUSCULAR | Status: DC | PRN

## 2018-05-07 MED ORDER — ONDANSETRON HCL 4 MG/2ML IJ SOLN
4.0000 mg | Freq: Four times a day (QID) | INTRAMUSCULAR | Status: DC | PRN
  Administered 2018-05-07 – 2018-05-08 (×2): 4 mg via INTRAVENOUS
  Filled 2018-05-07 (×2): qty 2

## 2018-05-07 SURGICAL SUPPLY — 65 items
BAG SPEC THK2 15X12 ZIP CLS (MISCELLANEOUS) ×1
BAG ZIPLOCK 12X15 (MISCELLANEOUS) ×3 IMPLANT
BANDAGE ACE 6X5 VEL STRL LF (GAUZE/BANDAGES/DRESSINGS) ×3 IMPLANT
BLADE SAG 18X100X1.27 (BLADE) ×3 IMPLANT
BLADE SAW SGTL 11.0X1.19X90.0M (BLADE) ×1 IMPLANT
BLADE SAW SGTL 13.0X1.19X90.0M (BLADE) ×2 IMPLANT
BOWL SMART MIX CTS (DISPOSABLE) ×3 IMPLANT
CEMENT HV SMART SET (Cement) ×6 IMPLANT
CEMENT TIBIA MBT (Knees) IMPLANT
CLOSURE WOUND 1/2 X4 (GAUZE/BANDAGES/DRESSINGS) ×1
COVER SURGICAL LIGHT HANDLE (MISCELLANEOUS) ×3 IMPLANT
CUFF TOURN SGL QUICK 34 (TOURNIQUET CUFF) ×3
CUFF TRNQT CYL 34X4X40X1 (TOURNIQUET CUFF) ×1 IMPLANT
DECANTER SPIKE VIAL GLASS SM (MISCELLANEOUS) ×5 IMPLANT
DRAPE U-SHAPE 47X51 STRL (DRAPES) ×3 IMPLANT
DRSG ADAPTIC 3X8 NADH LF (GAUZE/BANDAGES/DRESSINGS) ×3 IMPLANT
DRSG PAD ABDOMINAL 8X10 ST (GAUZE/BANDAGES/DRESSINGS) ×3 IMPLANT
DURAPREP 26ML APPLICATOR (WOUND CARE) ×3 IMPLANT
ELECT REM PT RETURN 15FT ADLT (MISCELLANEOUS) ×3 IMPLANT
EVACUATOR 1/8 PVC DRAIN (DRAIN) ×3 IMPLANT
GAUZE SPONGE 4X4 12PLY STRL (GAUZE/BANDAGES/DRESSINGS) ×3 IMPLANT
GLOVE BIO SURGEON STRL SZ7 (GLOVE) ×3 IMPLANT
GLOVE BIO SURGEON STRL SZ8 (GLOVE) ×3 IMPLANT
GLOVE BIOGEL PI IND STRL 6.5 (GLOVE) ×1 IMPLANT
GLOVE BIOGEL PI IND STRL 7.0 (GLOVE) ×1 IMPLANT
GLOVE BIOGEL PI IND STRL 7.5 (GLOVE) IMPLANT
GLOVE BIOGEL PI IND STRL 8 (GLOVE) ×1 IMPLANT
GLOVE BIOGEL PI INDICATOR 6.5 (GLOVE)
GLOVE BIOGEL PI INDICATOR 7.0 (GLOVE) ×4
GLOVE BIOGEL PI INDICATOR 7.5 (GLOVE) ×2
GLOVE BIOGEL PI INDICATOR 8 (GLOVE) ×2
GLOVE SURG SS PI 6.5 STRL IVOR (GLOVE) ×1 IMPLANT
GLOVE SURG SS PI 7.0 STRL IVOR (GLOVE) ×2 IMPLANT
GLOVE SURG SS PI 7.5 STRL IVOR (GLOVE) ×12 IMPLANT
GOWN SPEC L4 XLG W/TWL (GOWN DISPOSABLE) ×4 IMPLANT
GOWN STRL REUS W/TWL LRG LVL3 (GOWN DISPOSABLE) ×8 IMPLANT
GOWN STRL REUS W/TWL XL LVL3 (GOWN DISPOSABLE) ×3 IMPLANT
HANDPIECE INTERPULSE COAX TIP (DISPOSABLE) ×3
HOLDER FOLEY CATH W/STRAP (MISCELLANEOUS) ×2 IMPLANT
IMMOBILIZER KNEE 20 (SOFTGOODS) ×3
IMMOBILIZER KNEE 20 THIGH 36 (SOFTGOODS) ×1 IMPLANT
IMPL FEMUR SIGMA LT PS SZ 3 (Knees) IMPLANT
IMPLANT FEMUR SIGMA LT PS SZ 3 (Knees) ×3 IMPLANT
INSERT TIBIAL PFC SIG SZ3 10MM (Knees) ×2 IMPLANT
MANIFOLD NEPTUNE II (INSTRUMENTS) ×3 IMPLANT
NS IRRIG 1000ML POUR BTL (IV SOLUTION) ×3 IMPLANT
PACK TOTAL KNEE CUSTOM (KITS) ×3 IMPLANT
PAD ABD 8X10 STRL (GAUZE/BANDAGES/DRESSINGS) ×2 IMPLANT
PADDING CAST COTTON 6X4 STRL (CAST SUPPLIES) ×5 IMPLANT
PATELLA DOME PFC 38MM (Knees) ×2 IMPLANT
PIN STEINMAN FIXATION KNEE (PIN) ×4 IMPLANT
POSITIONER SURGICAL ARM (MISCELLANEOUS) ×3 IMPLANT
SET HNDPC FAN SPRY TIP SCT (DISPOSABLE) ×1 IMPLANT
STRIP CLOSURE SKIN 1/2X4 (GAUZE/BANDAGES/DRESSINGS) ×3 IMPLANT
SUT MNCRL AB 4-0 PS2 18 (SUTURE) ×3 IMPLANT
SUT STRATAFIX 0 PDS 27 VIOLET (SUTURE) ×3
SUT VIC AB 2-0 CT1 27 (SUTURE) ×9
SUT VIC AB 2-0 CT1 TAPERPNT 27 (SUTURE) ×3 IMPLANT
SUTURE STRATFX 0 PDS 27 VIOLET (SUTURE) ×1 IMPLANT
TIBIA MBT CEMENT (Knees) ×3 IMPLANT
TRAY FOLEY CATH 14FRSI W/METER (CATHETERS) ×2 IMPLANT
TRAY FOLEY MTR SLVR 16FR STAT (SET/KITS/TRAYS/PACK) ×1 IMPLANT
WATER STERILE IRR 1000ML POUR (IV SOLUTION) ×6 IMPLANT
WRAP KNEE MAXI GEL POST OP (GAUZE/BANDAGES/DRESSINGS) ×3 IMPLANT
YANKAUER SUCT BULB TIP 10FT TU (MISCELLANEOUS) ×3 IMPLANT

## 2018-05-07 NOTE — Anesthesia Procedure Notes (Signed)
Procedure Name: Intubation Date/Time: 05/07/2018 7:19 AM Performed by: Lieutenant Diego, CRNA Pre-anesthesia Checklist: Patient identified, Emergency Drugs available, Suction available and Patient being monitored Patient Re-evaluated:Patient Re-evaluated prior to induction Oxygen Delivery Method: Circle system utilized Preoxygenation: Pre-oxygenation with 100% oxygen Induction Type: IV induction and Rapid sequence Laryngoscope Size: Miller and 2 Grade View: Grade I Tube type: Oral Tube size: 7.0 mm Number of attempts: 1 Airway Equipment and Method: Stylet Placement Confirmation: ETT inserted through vocal cords under direct vision,  positive ETCO2 and breath sounds checked- equal and bilateral Secured at: 23 cm Tube secured with: Tape Dental Injury: Teeth and Oropharynx as per pre-operative assessment

## 2018-05-07 NOTE — Op Note (Signed)
OPERATIVE REPORT-TOTAL KNEE ARTHROPLASTY   Pre-operative diagnosis- Osteoarthritis  Left knee(s)  Post-operative diagnosis- Osteoarthritis Left knee(s)  Procedure-  Left  Total Knee Arthroplasty  Surgeon- Dione Plover. Skyann Ganim, MD  Assistant- Theresa Duty, PA-C   Anesthesia-  GA combined with regional for post-op pain  EBL-50 mL   Drains Hemovac  Tourniquet time-  Total Tourniquet Time Documented: Thigh (Left) - 36 minutes Total: Thigh (Left) - 36 minutes     Complications- None  Condition-PACU - hemodynamically stable.   Brief Clinical Note  Carolyn [name] is a 77 y.o. year old female with end stage OA of her left knee with progressively worsening pain and dysfunction. She has constant pain, with activity and at rest and significant functional deficits with difficulties even with ADLs. She has had extensive non-op management including analgesics, injections of cortisone and viscosupplements, and home exercise program, but remains in significant pain with significant dysfunction. Radiographs show bone on bone arthritis medial and patellofemoral. She presents now for left Total Knee Arthroplasty.    Procedure in detail---   The patient is brought into the operating room and positioned supine on the operating table. After successful administration of  GA combined with regional for post-op pain,   a tourniquet is placed high on the  Left thigh(s) and the lower extremity is prepped and draped in the usual sterile fashion. Time out is performed by the operating team and then the  Left lower extremity is wrapped in Esmarch, knee flexed and the tourniquet inflated to 300 mmHg.       A midline incision is made with a ten blade through the subcutaneous tissue to the level of the extensor mechanism. A fresh blade is used to make a medial parapatellar arthrotomy. Soft tissue over the proximal medial tibia is subperiosteally elevated to the joint line with a knife and into the semimembranosus  bursa with a Cobb elevator. Soft tissue over the proximal lateral tibia is elevated with attention being paid to avoiding the patellar tendon on the tibial tubercle. The patella is everted, knee flexed 90 degrees and the ACL and PCL are removed. Findings are bone on bone medial and patellofemoral with large global osteophytes.        The drill is used to create a starting hole in the distal femur and the canal is thoroughly irrigated with sterile saline to remove the fatty contents. The 5 degree Left  valgus alignment guide is placed into the femoral canal and the distal femoral cutting block is pinned to remove 10 mm off the distal femur. Resection is made with an oscillating saw.      The tibia is subluxed forward and the menisci are removed. The extramedullary alignment guide is placed referencing proximally at the medial aspect of the tibial tubercle and distally along the second metatarsal axis and tibial crest. The block is pinned to remove 60mm off the more deficient medial  side. Resection is made with an oscillating saw. Size 3is the most appropriate size for the tibia and the proximal tibia is prepared with the modular drill and keel punch for that size.      The femoral sizing guide is placed and size 3 is most appropriate. Rotation is marked off the epicondylar axis and confirmed by creating a rectangular flexion gap at 90 degrees. The size 3 cutting block is pinned in this rotation and the anterior, posterior and chamfer cuts are made with the oscillating saw. The intercondylar block is then placed and that  cut is made.      Trial size 3 tibial component, trial size 3 posterior stabilized femur and a 10  mm posterior stabilized rotating platform insert trial is placed. Full extension is achieved with excellent varus/valgus and anterior/posterior balance throughout full range of motion. The patella is everted and thickness measured to be 24  mm. Free hand resection is taken to 14 mm, a 38 template is  placed, lug holes are drilled, trial patella is placed, and it tracks normally. Osteophytes are removed off the posterior femur with the trial in place. All trials are removed and the cut bone surfaces prepared with pulsatile lavage. Cement is mixed and once ready for implantation, the size 3 tibial implant, size  3 posterior stabilized femoral component, and the size 38 patella are cemented in place and the patella is held with the clamp. The trial insert is placed and the knee held in full extension. The Exparel (20 ml mixed with 60 ml saline) is injected into the extensor mechanism, posterior capsule, medial and lateral gutters and subcutaneous tissues.  All extruded cement is removed and once the cement is hard the permanent 10 mm posterior stabilized rotating platform insert is placed into the tibial tray.      The wound is copiously irrigated with saline solution and the extensor mechanism closed over a hemovac drain with #1 V-loc suture. The tourniquet is released for a total tourniquet time of 36  minutes. Flexion against gravity is 140 degrees and the patella tracks normally. Subcutaneous tissue is closed with 2.0 vicryl and subcuticular with running 4.0 Monocryl. The incision is cleaned and dried and steri-strips and a bulky sterile dressing are applied. The limb is placed into a knee immobilizer and the patient is awakened and transported to recovery in stable condition.      Please note that a surgical assistant was a medical necessity for this procedure in order to perform it in a safe and expeditious manner. Surgical assistant was necessary to retract the ligaments and vital neurovascular structures to prevent injury to them and also necessary for proper positioning of the limb to allow for anatomic placement of the prosthesis.   Dione Plover Camillia Marcy, MD    05/07/2018, 8:19 AM

## 2018-05-07 NOTE — Evaluation (Signed)
Physical Therapy Evaluation Patient Details Name: Carolyn [name] MRN: [id number] DOB: [birth year] Today's Date: 05/07/2018   History of Present Illness  L TKA, postop nausea. H/O DM, IBS, back surgery  Clinical Impression  Patient  Mobilized and ambulated x 25'. Reported mild nausea at  End of walk. Patient  Hopeful to DC to home tomorrow.  Plans OPPT. Pt admitted with above diagnosis. Pt currently with functional limitations due to the deficits listed below (see PT Problem List). Pt will benefit from skilled PT to increase their independence and safety with mobility to allow discharge to the venue listed below.       Follow Up Recommendations Outpatient PT;Follow surgeon's recommendation for DC plan and follow-up therapies    Equipment Recommendations  None recommended by PT    Recommendations for Other Services       Precautions / Restrictions Precautions Precautions: Knee;Fall Required Braces or Orthoses: Knee Immobilizer - Left Knee Immobilizer - Left: Discontinue once straight leg raise with < 10 degree lag      Mobility  Bed Mobility Overal bed mobility: Needs Assistance Bed Mobility: Supine to Sit     Supine to sit: Min assist     General bed mobility comments: support left leg, cues for technique, use of  hands/arms  Transfers Overall transfer level: Needs assistance Equipment used: Rolling walker (2 wheeled) Transfers: Sit to/from Stand Sit to Stand: Min assist         General transfer comment: cues for hand and left leg position. cues  to reach back to recliner  Ambulation/Gait Ambulation/Gait assistance: Min assist Gait Distance (Feet): 25 Feet Assistive device: Rolling walker (2 wheeled) Gait Pattern/deviations: Step-to pattern;Step-through pattern     General Gait Details: cues for sequence, step length  Stairs            Wheelchair Mobility    Modified Rankin (Stroke Patients Only)       Balance                                              Pertinent Vitals/Pain Pain Assessment: 0-10 Pain Score: 2  Pain Location: left knee Pain Descriptors / Indicators: Discomfort;Sore Pain Intervention(s): Premedicated before session;Monitored during session;Ice applied    Home Living Family/patient expects to be discharged to:: Private residence Living Arrangements: Alone;Children;Other relatives;Non-relatives/Friends Available Help at Discharge: Family;Friend(s) Type of Home: House Home Access: Stairs to enter Entrance Stairs-Rails: Psychiatric nurse of Steps: 3 Home Layout: One level Home Equipment: Walker - 2 wheels;Bedside commode Additional Comments: patient will have multiple  different caregivers lined up    Prior Function Level of Independence: Independent               Hand Dominance        Extremity/Trunk Assessment   Upper Extremity Assessment Upper Extremity Assessment: Defer to OT evaluation    Lower Extremity Assessment Lower Extremity Assessment: LLE deficits/detail LLE Deficits / Details: performed SLR    Cervical / Trunk Assessment Cervical / Trunk Assessment: Normal  Communication   Communication: No difficulties  Cognition Arousal/Alertness: Awake/alert Behavior During Therapy: WFL for tasks assessed/performed Overall Cognitive Status: Within Functional Limits for tasks assessed  General Comments      Exercises     Assessment/Plan    PT Assessment Patient needs continued PT services  PT Problem List Decreased strength;Decreased range of motion;Decreased activity tolerance;Decreased mobility;Decreased knowledge of precautions;Decreased safety awareness;Decreased knowledge of use of DME;Pain       PT Treatment Interventions DME instruction;Therapeutic exercise;Gait training;Stair training;Functional mobility training;Therapeutic activities;Patient/family education    PT Goals (Current  goals can be found in the Care Plan section)  Acute Rehab PT Goals Patient Stated Goal: go home tomorrow PT Goal Formulation: With patient/family Time For Goal Achievement: 05/11/18 Potential to Achieve Goals: Good    Frequency 7X/week   Barriers to discharge        Co-evaluation               AM-PAC PT "6 Clicks" Daily Activity  Outcome Measure Difficulty turning over in bed (including adjusting bedclothes, sheets and blankets)?: A Lot Difficulty moving from lying on back to sitting on the side of the bed? : A Lot Difficulty sitting down on and standing up from a chair with arms (e.g., wheelchair, bedside commode, etc,.)?: A Lot Help needed moving to and from a bed to chair (including a wheelchair)?: A Lot Help needed walking in hospital room?: A Lot Help needed climbing 3-5 steps with a railing? : Total 6 Click Score: 11    End of Session Equipment Utilized During Treatment: Left knee immobilizer Activity Tolerance: Patient tolerated treatment well(somemnausea at end) Patient left: in chair;with call bell/phone within reach;with family/visitor present Nurse Communication: Mobility status PT Visit Diagnosis: Unsteadiness on feet (R26.81)    Time: 1093-2355 PT Time Calculation (min) (ACUTE ONLY): 23 min   Charges:   PT Evaluation $PT Eval Low Complexity: 1 Low PT Treatments $Gait Training: 8-22 mins       [place] Pager [phone] Office [phone]   Claretha Cooper 05/07/2018, 4:30 PM

## 2018-05-07 NOTE — Interval H&P Note (Signed)
History and Physical Interval Note:  05/07/2018 6:32 AM  Carolyn [name]  has presented today for surgery, with the diagnosis of Left knee osteoarthritis  The various methods of treatment have been discussed with the patient and family. After consideration of risks, benefits and other options for treatment, the patient has consented to  Procedure(s) with comments: LEFT TOTAL KNEE ARTHROPLASTY (Left) - 50 mins as a surgical intervention .  The patient's history has been reviewed, patient examined, no change in status, stable for surgery.  I have reviewed the patient's chart and labs.  Questions were answered to the patient's satisfaction.     Pilar Plate Lavaughn Bisig

## 2018-05-07 NOTE — Anesthesia Postprocedure Evaluation (Signed)
Anesthesia Post Note  Patient: Carolyn [name]  Procedure(s) Performed: LEFT TOTAL KNEE ARTHROPLASTY (Left Knee)     Patient location during evaluation: PACU Anesthesia Type: General Level of consciousness: awake and alert Pain management: pain level controlled Vital Signs Assessment: post-procedure vital signs reviewed and stable Respiratory status: spontaneous breathing, nonlabored ventilation, respiratory function stable and patient connected to nasal cannula oxygen Cardiovascular status: blood pressure returned to baseline and stable Postop Assessment: no apparent nausea or vomiting Anesthetic complications: no    Last Vitals:  Vitals:   05/07/18 1149 05/07/18 1251  BP: 139/79 (!) 146/98  Pulse: 82 78  Resp: 16 16  Temp: (!) 36.4 C (!) 36.4 C  SpO2: 97% 98%    Last Pain:  Vitals:   05/07/18 1251  TempSrc: Oral  PainSc:                  Tiajuana Amass

## 2018-05-07 NOTE — Transfer of Care (Signed)
Immediate Anesthesia Transfer of Care Note  Patient: Carolyn [name]  Procedure(s) Performed: LEFT TOTAL KNEE ARTHROPLASTY (Left Knee)  Patient Location: PACU  Anesthesia Type:General  Level of Consciousness: sedated  Airway & Oxygen Therapy: Patient Spontanous Breathing and Patient connected to face mask oxygen  Post-op Assessment: Report given to RN and Post -op Vital signs reviewed and stable  Post vital signs: Reviewed and stable  Last Vitals:  Vitals Value Taken Time  BP 163/90 05/07/2018  8:51 AM  Temp    Pulse 85 05/07/2018  8:53 AM  Resp 17 05/07/2018  8:53 AM  SpO2 100 % 05/07/2018  8:53 AM  Vitals shown include unvalidated device data.  Last Pain:  Vitals:   05/07/18 0547  TempSrc: Oral      Patients Stated Pain Goal: 3 (44/81/85 6314)  Complications: No apparent anesthesia complications

## 2018-05-07 NOTE — Discharge Instructions (Signed)

## 2018-05-07 NOTE — Anesthesia Procedure Notes (Addendum)
Anesthesia Regional Block: Adductor canal block   Pre-Anesthetic Checklist: ,, timeout performed, Correct Patient, Correct Site, Correct Laterality, Correct Procedure, Correct Position, site marked, Risks and benefits discussed,  Surgical consent,  Pre-op evaluation,  At surgeon's request and post-op pain management  Laterality: Left  Prep: chloraprep       Needles:  Injection technique: Single-shot  Needle Type: Echogenic Needle     Needle Length: 9cm  Needle Gauge: 21     Additional Needles:   Procedures:,,,, ultrasound used (permanent image in chart),,,,  Narrative:  Start time: 05/07/2018 6:42 AM End time: 05/07/2018 6:49 AM Injection made incrementally with aspirations every 5 mL.  Performed by: Personally  Anesthesiologist: Suzette Battiest, MD

## 2018-05-08 ENCOUNTER — Encounter (HOSPITAL_COMMUNITY): Payer: Self-pay | Admitting: Orthopedic Surgery

## 2018-05-08 LAB — CBC
HCT: 31.9 % — ABNORMAL LOW (ref 36.0–46.0)
Hemoglobin: 10.7 g/dL — ABNORMAL LOW (ref 12.0–15.0)
MCH: 30.4 pg (ref 26.0–34.0)
MCHC: 33.5 g/dL (ref 30.0–36.0)
MCV: 90.6 fL (ref 78.0–100.0)
Platelets: 206 10*3/uL (ref 150–400)
RBC: 3.52 MIL/uL — ABNORMAL LOW (ref 3.87–5.11)
RDW: 12.8 % (ref 11.5–15.5)
WBC: 6 10*3/uL (ref 4.0–10.5)

## 2018-05-08 LAB — BASIC METABOLIC PANEL
Anion gap: 7 (ref 5–15)
BUN: 12 mg/dL (ref 8–23)
CO2: 29 mmol/L (ref 22–32)
Calcium: 8.4 mg/dL — ABNORMAL LOW (ref 8.9–10.3)
Chloride: 94 mmol/L — ABNORMAL LOW (ref 98–111)
Creatinine, Ser: 0.81 mg/dL (ref 0.44–1.00)
GFR calc Af Amer: >60 mL/min (ref >60)
GFR calc non Af Amer: >60 mL/min (ref >60)
Glucose, Bld: 161 mg/dL — ABNORMAL HIGH (ref 70–99)
Potassium: 3.4 mmol/L — ABNORMAL LOW (ref 3.5–5.1)
Sodium: 130 mmol/L — ABNORMAL LOW (ref 135–145)

## 2018-05-08 LAB — GLUCOSE, CAPILLARY
Glucose-Capillary: 145 mg/dL — ABNORMAL HIGH (ref 70–99)
Glucose-Capillary: 184 mg/dL — ABNORMAL HIGH (ref 70–99)
Glucose-Capillary: 224 mg/dL — ABNORMAL HIGH (ref 70–99)
Glucose-Capillary: 201 mg/dL — ABNORMAL HIGH (ref 70–99)

## 2018-05-08 MED ORDER — POTASSIUM CHLORIDE CRYS ER 20 MEQ PO TBCR
40.0000 meq | EXTENDED_RELEASE_TABLET | Freq: Once | ORAL | Status: AC
  Administered 2018-05-08: 40 meq via ORAL
  Filled 2018-05-08: qty 2

## 2018-05-08 MED ORDER — ACETAMINOPHEN 500 MG PO TABS
500.0000 mg | ORAL_TABLET | Freq: Four times a day (QID) | ORAL | Status: DC | PRN
  Administered 2018-05-09 (×2): 500 mg via ORAL
  Filled 2018-05-08 (×2): qty 1

## 2018-05-08 NOTE — Care Management Note (Signed)
Case Management Note  Patient Details  Name: Carolyn [name] MRN: [id number] Date of Birth: [birth year]  Subjective/Objective:    Spoke with patient at bedside. Confirmed plan for OP PT, already arranged. Has RW and 3n1. [phone]                Action/Plan:   Expected Discharge Date:  05/09/18               Expected Discharge Plan:  OP Rehab  In-House Referral:  NA  Discharge planning Services  CM Consult  Post Acute Care Choice:  NA Choice offered to:  Patient  DME Arranged:  N/A DME Agency:  NA  HH Arranged:  NA HH Agency:  NA  Status of Service:  Completed, signed off  If discussed at [place] of Stay Meetings, dates discussed:    Additional Comments:  Guadalupe Maple, RN 05/08/2018, 11:07 AM

## 2018-05-08 NOTE — Progress Notes (Signed)
Physical Therapy Treatment Patient Details Name: Carolyn [name] MRN: [id number] DOB: 10-25-1940 Today's Date: 05/08/2018    History of Present Illness L TKA, postop nausea. H/O DM, IBS, back surgery    PT Comments    Pt agreeable to perform exercises in recliner however reports pain so RN notified (and pt was given IV pain meds).  Pt with dizziness and increased grogginess after performing exercises so will defer mobility to this afternoon.   Follow Up Recommendations  Outpatient PT;Follow surgeon's recommendation for DC plan and follow-up therapies     Equipment Recommendations  None recommended by PT    Recommendations for Other Services       Precautions / Restrictions Precautions Precautions: Knee;Fall Precaution Comments: did not use KI:  pt doing SLRs Required Braces or Orthoses: Knee Immobilizer - Left Knee Immobilizer - Left: Discontinue once straight leg raise with < 10 degree lag Restrictions Weight Bearing Restrictions: No Other Position/Activity Restrictions: WBAT       Balance                                            Cognition Arousal/Alertness: Awake/alert Behavior During Therapy: WFL for tasks assessed/performed Overall Cognitive Status: Within Functional Limits for tasks assessed                                        Exercises Total Joint Exercises Ankle Circles/Pumps: AROM;10 reps;Both Quad Sets: AROM;10 reps;Left Short Arc Quad: AAROM;10 reps;Left Heel Slides: AAROM;10 reps;Left Hip ABduction/ADduction: AAROM;10 reps;Left Straight Leg Raises: 10 reps;AAROM;Left Goniometric ROM: approx 50* AAROM L knee flexion with heel slides in recliner    General Comments        Pertinent Vitals/Pain Pain Assessment: 0-10 Pain Score: 7  Pain Location: left knee Pain Descriptors / Indicators: Discomfort;Sore Pain Intervention(s): RN gave pain meds during session;Monitored during session;Limited activity within  patient's tolerance;Ice applied    Home Living Family/patient expects to be discharged to:: Private residence Living Arrangements: Alone;Children;Other relatives;Non-relatives/Friends Available Help at Discharge: Family;Friend(s)         Home Equipment: Gilford Rile - 2 wheels Additional Comments: may sleep in recliner    Prior Function Level of Independence: Independent          PT Goals (current goals can now be found in the care plan section) Acute Rehab PT Goals Patient Stated Goal: go home tomorrow Progress towards PT goals: Not progressing toward goals - comment(increased pain limiting mobility this morning)    Frequency    7X/week      PT Plan Current plan remains appropriate    Co-evaluation              AM-PAC PT "6 Clicks" Daily Activity  Outcome Measure  Difficulty turning over in bed (including adjusting bedclothes, sheets and blankets)?: Unable Difficulty moving from lying on back to sitting on the side of the bed? : Unable Difficulty sitting down on and standing up from a chair with arms (e.g., wheelchair, bedside commode, etc,.)?: Unable Help needed moving to and from a bed to chair (including a wheelchair)?: Total Help needed walking in hospital room?: Total Help needed climbing 3-5 steps with a railing? : Total 6 Click Score: 6    End of Session   Activity Tolerance: Patient limited by fatigue;Other (  comment)(pain, dizziness limiting, medicated with IV pain meds during session) Patient left: in chair;with call bell/phone within reach;with family/visitor present Nurse Communication: Mobility status;Patient requests pain meds(family found pain pill in pt's bed, given to RN) PT Visit Diagnosis: Pain Pain - Right/Left: Left Pain - part of body: Knee     Time: 1444-5848 PT Time Calculation (min) (ACUTE ONLY): 24 min  Charges:  $Therapeutic Exercise: 8-22 mins                    Carmelia Bake, PT, DPT Acute Rehabilitation Services Office:  [phone] Pager: [phone]  Trena Platt 05/08/2018, 1:05 PM

## 2018-05-08 NOTE — Evaluation (Signed)
Occupational Therapy Evaluation Patient Details Name: Carolyn [name] MRN: [id number] DOB: Feb 07, 1941 Today's Date: 05/08/2018    History of Present Illness L TKA, postop nausea. H/O DM, IBS, back surgery   Clinical Impression   This 77 year old female was admitted for the above. She ambulated to bathroom but was limited by nausea after returning to chair. Pt had LOB when standing for clothing management. Will follow in acute setting with the goals listed below, focusing on bathroom transfers. Pt will have 24/7 assistance at home. Educated her and caregiver that she will need a shower seat or 3:1 to use as a seat in shower for safety    Follow Up Recommendations  Supervision/Assistance - 24 hour    Equipment Recommendations  (pt doesn't want 3:1:  will need shower seat)    Recommendations for Other Services       Precautions / Restrictions Precautions Precautions: Knee;Fall Precaution Comments: did not use KI:  pt doing SLRs Required Braces or Orthoses: Knee Immobilizer - Left Knee Immobilizer - Left: Discontinue once straight leg raise with < 10 degree lag Restrictions Weight Bearing Restrictions: No      Mobility Bed Mobility         Supine to sit: Min assist     General bed mobility comments: assist for LLE; from flat bed without rails  Transfers   Equipment used: Rolling walker (2 wheeled)   Sit to Stand: Min assist         General transfer comment: cues for UE/LE placement. Assist to rise and stabilize    Balance                                           ADL either performed or assessed with clinical judgement   ADL Overall ADL's : Needs assistance/impaired Eating/Feeding: Independent   Grooming: Oral care;Min guard;Standing   Upper Body Bathing: Set up   Lower Body Bathing: Moderate assistance;Sit to/from stand   Upper Body Dressing : Set up;Sitting   Lower Body Dressing: Moderate assistance;Sit to/from stand   Toilet  Transfer: Minimal assistance;Ambulation;BSC;RW   Toileting- Clothing Manipulation and Hygiene: Maximal assistance;Sit to/from stand         General ADL Comments: ambulated to bathroom and performed toileting and grooming. Pt had LOB when trying to pull pants up, requiring max A to recover balance.       Vision         Perception     Praxis      Pertinent Vitals/Pain Pain Score: 2  Pain Location: left knee Pain Descriptors / Indicators: Discomfort;Sore Pain Intervention(s): Limited activity within patient's tolerance;Monitored during session;Premedicated before session;Repositioned;Ice applied     Hand Dominance     Extremity/Trunk Assessment Upper Extremity Assessment Upper Extremity Assessment: Overall WFL for tasks assessed           Communication Communication Communication: No difficulties   Cognition Arousal/Alertness: Awake/alert Behavior During Therapy: WFL for tasks assessed/performed Overall Cognitive Status: Within Functional Limits for tasks assessed                                     General Comments       Exercises     Shoulder Instructions      Home Living Family/patient expects to be discharged to:: Private  residence Living Arrangements: Alone;Children;Other relatives;Non-relatives/Friends Available Help at Discharge: Family;Friend(s)               Bathroom Shower/Tub: Walk-in shower   Bathroom Toilet: Handicapped height     Home Equipment: Environmental consultant - 2 wheels   Additional Comments: may sleep in recliner      Prior Functioning/Environment Level of Independence: Independent                 OT Problem List: Decreased strength;Decreased activity tolerance;Impaired balance (sitting and/or standing);Decreased safety awareness;Decreased knowledge of use of DME or AE;Pain      OT Treatment/Interventions: Self-care/ADL training;DME and/or AE instruction;Patient/family education;Balance training;Therapeutic  activities    OT Goals(Current goals can be found in the care plan section) Acute Rehab OT Goals Patient Stated Goal: go home tomorrow OT Goal Formulation: With patient Time For Goal Achievement: 05/22/18 Potential to Achieve Goals: Good ADL Goals Pt Will Transfer to Toilet: with min guard assist;ambulating(high commode with sink) Pt Will Perform Toileting - Clothing Manipulation and hygiene: with min guard assist;sit to/from stand Pt Will Perform Tub/Shower Transfer: Shower transfer;ambulating;shower seat;with min guard assist  OT Frequency: Min 2X/week   Barriers to D/C:            Co-evaluation              AM-PAC PT "6 Clicks" Daily Activity     Outcome Measure Help from another person eating meals?: None Help from another person taking care of personal grooming?: A Little Help from another person toileting, which includes using toliet, bedpan, or urinal?: A Lot Help from another person bathing (including washing, rinsing, drying)?: A Lot Help from another person to put on and taking off regular upper body clothing?: A Little Help from another person to put on and taking off regular lower body clothing?: A Lot 6 Click Score: 16   End of Session Nurse Communication: (pt nauseous)  Activity Tolerance: (limited by nausea) Patient left: in chair;with call bell/phone within reach;with chair alarm set  OT Visit Diagnosis: Unsteadiness on feet (R26.81);Pain Pain - Right/Left: Left Pain - part of body: Knee                Time: 1011-1044 OT Time Calculation (min): 33 min Charges:  OT General Charges $OT Visit: 1 Visit OT Evaluation $OT Eval Low Complexity: 1 Low OT Treatments $Self Care/Home Management : 8-22 mins  Lesle Chris, OTR/L Acute Rehabilitation Services [phone] [facility] pager [phone] office 05/08/2018  [place] 05/08/2018, 11:40 AM

## 2018-05-08 NOTE — Progress Notes (Signed)
Physical Therapy Treatment Patient Details Name: Carolyn [name] MRN: [id number] DOB: 1940/10/12 Today's Date: 05/08/2018    History of Present Illness L TKA, postop nausea. H/O DM, IBS, back surgery    PT Comments    Pt assisted with ambulating in hallway.  Pt reports pain in L knee however more tolerable with wearing KI.  Pt assisted back to bed end of session.  Follow Up Recommendations  Follow surgeon's recommendation for DC plan and follow-up therapies     Equipment Recommendations  None recommended by PT    Recommendations for Other Services       Precautions / Restrictions Precautions Precautions: Knee;Fall Precaution Comments: used KI for pain control Required Braces or Orthoses: Knee Immobilizer - Left Knee Immobilizer - Left: Discontinue once straight leg raise with < 10 degree lag Restrictions Other Position/Activity Restrictions: WBAT    Mobility  Bed Mobility Overal bed mobility: Needs Assistance Bed Mobility: Supine to Sit     Supine to sit: Min assist     General bed mobility comments: assist for L LE  Transfers Overall transfer level: Needs assistance Equipment used: Rolling walker (2 wheeled) Transfers: Sit to/from Stand           General transfer comment: cues for UE/LE placement. Assist to rise and stabilize due to posterior lean upon standing  Ambulation/Gait Ambulation/Gait assistance: Min assist Gait Distance (Feet): 80 Feet Assistive device: Rolling walker (2 wheeled) Gait Pattern/deviations: Decreased stance time - left;Step-through pattern;Antalgic     General Gait Details: verbal cues for sequence, step length, posture, very slow pace   Stairs             Wheelchair Mobility    Modified Rankin (Stroke Patients Only)       Balance                                            Cognition Arousal/Alertness: Awake/alert Behavior During Therapy: WFL for tasks assessed/performed Overall Cognitive  Status: Within Functional Limits for tasks assessed                                        Exercises     General Comments        Pertinent Vitals/Pain Pain Assessment: 0-10 Pain Score: 6  Pain Location: left knee Pain Descriptors / Indicators: Discomfort;Sore Pain Intervention(s): Limited activity within patient's tolerance;Repositioned;Monitored during session    Home Living                      Prior Function            PT Goals (current goals can now be found in the care plan section) Progress towards PT goals: Progressing toward goals    Frequency    7X/week      PT Plan Current plan remains appropriate    Co-evaluation              AM-PAC PT "6 Clicks" Daily Activity  Outcome Measure  Difficulty turning over in bed (including adjusting bedclothes, sheets and blankets)?: Unable Difficulty moving from lying on back to sitting on the side of the bed? : Unable Difficulty sitting down on and standing up from a chair with arms (e.g., wheelchair, bedside commode, etc,.)?: Unable Help needed moving  to and from a bed to chair (including a wheelchair)?: A Little Help needed walking in hospital room?: A Little Help needed climbing 3-5 steps with a railing? : A Lot 6 Click Score: 11    End of Session Equipment Utilized During Treatment: Left knee immobilizer;Gait belt Activity Tolerance: Patient tolerated treatment well Patient left: in bed;with call bell/phone within reach;with family/visitor present Nurse Communication: Mobility status;Patient requests pain meds(family found pain pill in pt's bed, given to RN) PT Visit Diagnosis: Difficulty in walking, not elsewhere classified (R26.2) Pain - Right/Left: Left Pain - part of body: Knee     Time: 1452-1510 PT Time Calculation (min) (ACUTE ONLY): 18 min  Charges:  $Gait Training: 8-22 mins                     Carmelia Bake, PT, DPT Acute Rehabilitation Services Office:  [phone] Pager: [phone]   Trena Platt 05/08/2018, 3:58 PM

## 2018-05-08 NOTE — Plan of Care (Signed)
Plan of care reviewed with pt.

## 2018-05-08 NOTE — Progress Notes (Signed)
   Subjective: 1 Day Post-Op Procedure(s) (LRB): LEFT TOTAL KNEE ARTHROPLASTY (Left) Patient reports pain as moderate.   Patient seen in rounds with Dr. Wynelle Link. Patient is well, and has had no acute complaints or problems other than pain in the left knee. Foley catheter removed this AM. No issues overnight. Denies chest pain, SOB or calf pain. We will continue therapy today.   Objective: Vital signs in last 24 hours: Temp:  [97.5 F (36.4 C)-98.2 F (36.8 C)] 98.1 F (36.7 C) (10/01 0506) Pulse Rate:  [77-89] 77 (10/01 0506) Resp:  [14-21] 16 (10/01 0506) BP: (115-170)/(79-99) 115/82 (10/01 0506) SpO2:  [95 %-100 %] 99 % (10/01 0506)  Intake/Output from previous day:  Intake/Output Summary (Last 24 hours) at 05/08/2018 0707 Last data filed at 05/08/2018 0600 Gross per 24 hour  Intake 3739.76 ml  Output 2335 ml  Net 1404.76 ml    Labs: Recent Labs    05/08/18 0511  HGB 10.7*   Recent Labs    05/08/18 0511  WBC 6.0  RBC 3.52*  HCT 31.9*  PLT 206   Recent Labs    05/08/18 0511  NA 130*  K 3.4*  CL 94*  CO2 29  BUN 12  CREATININE 0.81  GLUCOSE 161*  CALCIUM 8.4*   Exam: General - Patient is Alert and Oriented Extremity - Neurologically intact Neurovascular intact Sensation intact distally Dorsiflexion/Plantar flexion intact Dressing - dressing C/D/I Motor Function - intact, moving foot and toes well on exam.   Past Medical History:  Diagnosis Date  . Anxiety   . Arthritis   . Complication of anesthesia    " my heart rate gets low"  . Depression   . Diabetes mellitus ([place])    type 2  . GERD (gastroesophageal reflux disease)   . Hepatic steatosis   . Hyperlipidemia   . Hyperparathyroidism, primary ([place])   . Hypertension   . IBS (irritable bowel syndrome)   . Internal hemorrhoid   . Tubular adenoma of colon 1999   colonoscopy  . Wears dentures   . Wears glasses     Assessment/Plan: 1 Day Post-Op Procedure(s) (LRB): LEFT TOTAL KNEE  ARTHROPLASTY (Left) Principal Problem:   OA (osteoarthritis) of knee  Estimated body mass index is 28.19 kg/m as calculated from the following:   Height as of this encounter: 5\' 7"  (1.702 m).   Weight as of this encounter: 81.6 kg. Advance diet Up with therapy  Anticipated LOS equal to or greater than 2 midnights due to - Age 78 and older with one or more of the following:  - Obesity  - Expected need for hospital services (PT, OT, Nursing) required for safe  discharge  - Anticipated need for postoperative skilled nursing care or inpatient rehab  - Active co-morbidities: Diabetes OR   - Unanticipated findings during/Post Surgery: None  - Patient is a high risk of re-admission due to: None    DVT Prophylaxis - Aspirin Weight bearing as tolerated. D/C O2 and pulse ox and try on room air. Hemovac pulled without difficulty, will continue therapy today.  Potassium low at 3.4 this AM, one dose of 40 mEq KCl ordered. Plan is to go Home after hospital stay. Possible discharge tomorrow if progresses with therapy and meeting goals.  Theresa Duty, PA-C Orthopedic Surgery 05/08/2018, 7:07 AM

## 2018-05-09 LAB — CBC
HCT: 32.2 % — ABNORMAL LOW (ref 36.0–46.0)
Hemoglobin: 10.9 g/dL — ABNORMAL LOW (ref 12.0–15.0)
MCH: 30.3 pg (ref 26.0–34.0)
MCHC: 33.9 g/dL (ref 30.0–36.0)
MCV: 89.4 fL (ref 78.0–100.0)
Platelets: 225 10*3/uL (ref 150–400)
RBC: 3.6 MIL/uL — ABNORMAL LOW (ref 3.87–5.11)
RDW: 12.5 % (ref 11.5–15.5)
WBC: 5.9 10*3/uL (ref 4.0–10.5)

## 2018-05-09 LAB — BASIC METABOLIC PANEL
Anion gap: 11 (ref 5–15)
BUN: 14 mg/dL (ref 8–23)
CO2: 28 mmol/L (ref 22–32)
Calcium: 9.2 mg/dL (ref 8.9–10.3)
Chloride: 92 mmol/L — ABNORMAL LOW (ref 98–111)
Creatinine, Ser: 0.83 mg/dL (ref 0.44–1.00)
GFR calc Af Amer: >60 mL/min (ref >60)
GFR calc non Af Amer: >60 mL/min (ref >60)
Glucose, Bld: 205 mg/dL — ABNORMAL HIGH (ref 70–99)
Potassium: 3.9 mmol/L (ref 3.5–5.1)
Sodium: 131 mmol/L — ABNORMAL LOW (ref 135–145)

## 2018-05-09 LAB — GLUCOSE, CAPILLARY: Glucose-Capillary: 172 mg/dL — ABNORMAL HIGH (ref 70–99)

## 2018-05-09 MED ORDER — ASPIRIN 325 MG PO TBEC: 325.0000 mg | DELAYED_RELEASE_TABLET | Freq: Two times a day (BID) | ORAL | 0 refills | Status: AC

## 2018-05-09 MED ORDER — METHOCARBAMOL 500 MG PO TABS: 500.0000 mg | ORAL_TABLET | Freq: Four times a day (QID) | ORAL | 0 refills | Status: DC | PRN

## 2018-05-09 MED ORDER — OXYCODONE HCL 5 MG PO TABS: 5.0000 mg | ORAL_TABLET | Freq: Four times a day (QID) | ORAL | 0 refills | Status: DC | PRN

## 2018-05-09 NOTE — Progress Notes (Signed)
Occupational Therapy Treatment Patient Details Name: Carolyn [name] MRN: [id number] DOB: August 25, 1940 Today's Date: 05/09/2018    History of present illness L TKA, postop nausea. H/O DM, IBS, back surgery   OT comments  Used KI today. Pt reports that she feels tired. Only took Tylenol as she states that yesterday medication made her feel unclear cognitively. Tolerated well. She still has a tendency to lose balance posteriorly when she first stands from commode. Encouraged her to use flat hand on bar, but she tends to pull up on this. She does not have grab bar at home.    Follow Up Recommendations  Supervision/Assistance - 24 hour    Equipment Recommendations  (shower seat)    Recommendations for Other Services      Precautions / Restrictions Precautions Precautions: Knee;Fall Precaution Comments: used KI as pt did not perform SLR this am Required Braces or Orthoses: Knee Immobilizer - Left Knee Immobilizer - Left: Discontinue once straight leg raise with < 10 degree lag Restrictions Other Position/Activity Restrictions: WBAT       Mobility Bed Mobility               General bed mobility comments: oob  Transfers   Equipment used: Rolling walker (2 wheeled)   Sit to Stand: Min guard;Min assist         General transfer comment: min guard from recliner and min A from comfort height commode    Balance                                           ADL either performed or assessed with clinical judgement   ADL                           Toilet Transfer: Min guard;Minimal assistance;Ambulation;Comfort height toilet;RW       Tub/ Shower Transfer: Min guard;Ambulation     General ADL Comments: ambulated to bathroom, practiced toilet transfer and simulated shower transfer as she has a very small ledge.  Pt has a tendency to use grab bar as a grab bar. She has only a sink at home next to commode. She also has a tendency to lose balance  posteriorly when she first stands. Cued her to move RW a little bit further to help her weight shift forward     Vision       Perception     Praxis      Cognition Arousal/Alertness: Awake/alert Behavior During Therapy: WFL for tasks assessed/performed Overall Cognitive Status: Within Functional Limits for tasks assessed                                          Exercises     Shoulder Instructions       General Comments      Pertinent Vitals/ Pain       Pain Score: 5  Pain Location: left knee Pain Descriptors / Indicators: Discomfort;Sore Pain Intervention(s): Limited activity within patient's tolerance;Monitored during session;Premedicated before session;Repositioned;Ice applied  Home Living  Prior Functioning/Environment              Frequency  Min 2X/week        Progress Toward Goals  OT Goals(current goals can now be found in the care plan section)  Progress towards OT goals: Progressing toward goals     Plan      Co-evaluation                 AM-PAC PT "6 Clicks" Daily Activity     Outcome Measure   Help from another person eating meals?: None Help from another person taking care of personal grooming?: A Little Help from another person toileting, which includes using toliet, bedpan, or urinal?: A Lot Help from another person bathing (including washing, rinsing, drying)?: A Lot Help from another person to put on and taking off regular upper body clothing?: A Little Help from another person to put on and taking off regular lower body clothing?: A Lot 6 Click Score: 16    End of Session    OT Visit Diagnosis: Unsteadiness on feet (R26.81);Pain Pain - Right/Left: Left Pain - part of body: Knee   Activity Tolerance Patient tolerated treatment well   Patient Left in chair;with call bell/phone within reach;with chair alarm set   Nurse Communication           Time: 3748-2707 OT Time Calculation (min): 22 min  Charges: OT General Charges $OT Visit: 1 Visit OT Treatments $Self Care/Home Management : 8-22 mins  Carolyn [name], Carolyn [name] Acute Rehabilitation Services [phone] [facility] pager [phone] office 05/09/2018   Climmie Cronce 05/09/2018, 9:58 AM

## 2018-05-09 NOTE — Progress Notes (Signed)
Pt noted to have elevated BP 180/98. Pt alert and oriented. No acute distress. Doctor Corine Shelter was made aware, no new orders. Continue to monitor.

## 2018-05-09 NOTE — Plan of Care (Signed)
Plane of care reviewed with PT. Pain management in progress, effective.

## 2018-05-09 NOTE — Progress Notes (Signed)
Physical Therapy Treatment Patient Details Name: Carolyn [name] MRN: [id number] DOB: 11-Jan-1941 Today's Date: 05/09/2018    History of Present Illness L TKA, postop nausea. H/O DM, IBS, back surgery    PT Comments    Pt ambulated in hallway and practiced safe stair technique with family present and assisting.  Pt also performed LE exercises and provided with HEP handout.  Pt and family feel ready for d/c home today.    Follow Up Recommendations  Follow surgeon's recommendation for DC plan and follow-up therapies     Equipment Recommendations  None recommended by PT    Recommendations for Other Services       Precautions / Restrictions Precautions Precautions: Knee;Fall Required Braces or Orthoses: Knee Immobilizer - Left Knee Immobilizer - Left: Discontinue once straight leg raise with < 10 degree lag Restrictions Weight Bearing Restrictions: No Other Position/Activity Restrictions: WBAT    Mobility  Bed Mobility               General bed mobility comments: pt up in recliner on arrival  Transfers Overall transfer level: Needs assistance Equipment used: Rolling walker (2 wheeled) Transfers: Sit to/from Stand Sit to Stand: Min guard;Modified independent (Device/Increase time)         General transfer comment: min/guard for safety, verbal cues for UE and LE, pt's family also providing min/guard with gait belt during session  Ambulation/Gait Ambulation/Gait assistance: Min guard Gait Distance (Feet): 80 Feet Assistive device: Rolling walker (2 wheeled) Gait Pattern/deviations: Decreased stance time - left;Step-through pattern;Antalgic     General Gait Details: verbal cues for sequence, step length, posture, very slow pace   Stairs Stairs: Yes Stairs assistance: Min guard Stair Management: Step to pattern;Backwards;With walker Number of Stairs: 2 General stair comments: verbal cues for sequence, RW positioning, safety; family present and assisted,  provided handout, pt and family reports understanding   Wheelchair Mobility    Modified Rankin (Stroke Patients Only)       Balance                                            Cognition Arousal/Alertness: Awake/alert Behavior During Therapy: WFL for tasks assessed/performed Overall Cognitive Status: Within Functional Limits for tasks assessed                                        Exercises Total Joint Exercises Ankle Circles/Pumps: AROM;10 reps;Both Quad Sets: AROM;10 reps;Left Towel Squeeze: AROM;10 reps;Both Short Arc Quad: 10 reps;Left;AROM Heel Slides: AAROM;10 reps;Left Hip ABduction/ADduction: AAROM;10 reps;Left Straight Leg Raises: 10 reps;AAROM;Left    General Comments        Pertinent Vitals/Pain Pain Assessment: 0-10 Pain Score: 4  Pain Location: left knee Pain Descriptors / Indicators: Discomfort;Sore;Aching Pain Intervention(s): Limited activity within patient's tolerance;Repositioned;Monitored during session;Patient requesting pain meds-RN notified    Home Living                      Prior Function            PT Goals (current goals can now be found in the care plan section) Progress towards PT goals: Progressing toward goals    Frequency    7X/week      PT Plan Current plan remains appropriate    Co-evaluation  AM-PAC PT "6 Clicks" Daily Activity  Outcome Measure  Difficulty turning over in bed (including adjusting bedclothes, sheets and blankets)?: A Lot Difficulty moving from lying on back to sitting on the side of the bed? : A Lot Difficulty sitting down on and standing up from a chair with arms ([name].g., wheelchair, bedside commode, etc,.)?: A Lot Help needed moving to and from a bed to chair (including a wheelchair)?: A Little Help needed walking in hospital room?: A Little Help needed climbing 3-5 steps with a railing? : A Little 6 Click Score: 15    End of Session  Equipment Utilized During Treatment: Left knee immobilizer;Gait belt Activity Tolerance: Patient tolerated treatment well Patient left: in chair;with family/visitor present;with call bell/phone within reach Nurse Communication: Mobility status;Patient requests pain meds PT Visit Diagnosis: Difficulty in walking, not elsewhere classified (R26.2)     Time: 1093-2355 PT Time Calculation (min) (ACUTE ONLY): 31 min  Charges:  $Gait Training: 8-22 mins $Therapeutic Exercise: 8-22 mins                    Carmelia Bake, PT, DPT Acute Rehabilitation Services Office: [phone] Pager: [phone]  Carolyn [name] 05/09/2018, 1:20 PM

## 2018-05-09 NOTE — Progress Notes (Signed)
   Subjective: 2 Days Post-Op Procedure(s) (LRB): LEFT TOTAL KNEE ARTHROPLASTY (Left) Patient reports pain as moderate.   Patient seen in rounds with Dr. Wynelle Link. Patient is well, and has had no acute complaints or problems. Had some issues with hypertension overnight, will try to get antihypertensives on board earlier this AM. Voiding without difficulty and positive flatus. Denies chest pain, SOB, or calf pain. Plan is to go Home after hospital stay.  Objective: Vital signs in last 24 hours: Temp:  [98 F (36.7 C)-99.2 F (37.3 C)] 98.4 F (36.9 C) (10/02 0540) Pulse Rate:  [74-102] 95 (10/02 0540) Resp:  [16-20] 20 (10/02 0540) BP: (118-180)/(77-105) 159/91 (10/02 0540) SpO2:  [86 %-98 %] 95 % (10/02 0540)  Intake/Output from previous day:  Intake/Output Summary (Last 24 hours) at 05/09/2018 0717 Last data filed at 05/09/2018 0650 Gross per 24 hour  Intake 1640.31 ml  Output 3600 ml  Net -1959.69 ml    Labs: Recent Labs    05/08/18 0511 05/09/18 0356  HGB 10.7* 10.9*   Recent Labs    05/08/18 0511 05/09/18 0356  WBC 6.0 5.9  RBC 3.52* 3.60*  HCT 31.9* 32.2*  PLT 206 225   Recent Labs    05/08/18 0511 05/09/18 0356  NA 130* 131*  K 3.4* 3.9  CL 94* 92*  CO2 29 28  BUN 12 14  CREATININE 0.81 0.83  GLUCOSE 161* 205*  CALCIUM 8.4* 9.2   Exam: General - Patient is Alert and Oriented Extremity - Neurologically intact Neurovascular intact Sensation intact distally Dorsiflexion/Plantar flexion intact Dressing/Incision - clean, dry, no drainage Motor Function - intact, moving foot and toes well on exam.   Past Medical History:  Diagnosis Date  . Anxiety   . Arthritis   . Complication of anesthesia    " my heart rate gets low"  . Depression   . Diabetes mellitus ([place])    type 2  . GERD (gastroesophageal reflux disease)   . Hepatic steatosis   . Hyperlipidemia   . Hyperparathyroidism, primary ([place])   . Hypertension   . IBS (irritable bowel  syndrome)   . Internal hemorrhoid   . Tubular adenoma of colon 1999   colonoscopy  . Wears dentures   . Wears glasses     Assessment/Plan: 2 Days Post-Op Procedure(s) (LRB): LEFT TOTAL KNEE ARTHROPLASTY (Left) Principal Problem:   OA (osteoarthritis) of knee  Estimated body mass index is 28.19 kg/m as calculated from the following:   Height as of this encounter: 5\' 7"  (1.702 m).   Weight as of this encounter: 81.6 kg. Up with therapy D/C IV fluids  DVT Prophylaxis - Aspirin Weight-bearing as tolerated  Will continue to monitor blood pressure. Possible discharge later today after two sessions of therapy if meeting goals, otherwise she will stay until tomorrow. Potassium improved at 3.9 this AM. Follow-up in the office in 2 weeks with Dr. Wynelle Link. Scheduled for outpatient physical therapy at [facility] in [place].  Carolyn Duty, PA-C Orthopedic Surgery 05/09/2018, 7:17 AM

## 2018-05-09 NOTE — Progress Notes (Signed)
05/09/18 1100  OT Visit Information  Last OT Received On 05/09/18  Assistance Needed +1  History of Present Illness L TKA, postop nausea. H/O DM, IBS, back surgery  Precautions  Precautions Knee;Fall  Required Braces or Orthoses Knee Immobilizer - Left  Knee Immobilizer - Left Discontinue once straight leg raise with < 10 degree lag  Pain Assessment  Pain Score 2  Pain Location left knee  Pain Descriptors / Indicators Discomfort;Sore  Pain Intervention(s) Limited activity within patient's tolerance  Cognition  Arousal/Alertness Awake/alert  Behavior During Therapy WFL for tasks assessed/performed  Overall Cognitive Status Within Functional Limits for tasks assessed  ADL  General ADL Comments family present. Educated them on shower transfer and applying [place].  family states they will have a shower seat prior to pt taking a shower. Educated on placement and using RW to get to seat if chair not directly behind her against the wall.  Educated on [place] and removal of this when sitting for shower.  Demonstrated sidestepping through bathroom door as doorway is not wide enough for RW. Reinforced need for gait belt when walking, having pt scoot forward on commode and making sure that RW is out slightly in front of body when standing due to pt's tendency to lose balance posteriorly.  Also, if KI used, to use stool or trash can to support it, as it won't touch floor.   Family verbalizes understanding of all education  Restrictions  Other Position/Activity Restrictions WBAT  OT - End of Session  Activity Tolerance Patient tolerated treatment well  Patient left in chair;with call bell/phone within reach;with chair alarm set  OT Assessment/Plan  Follow Up Recommendations Supervision/Assistance - 24 hour  OT Equipment  (family will get shower seat)  AM-PAC OT "6 Clicks" Daily Activity Outcome Measure  Help from another person eating meals? 4  Help from another person taking care of personal grooming? 3   Help from another person toileting, which includes using toliet, bedpan, or urinal? 2  Help from another person bathing (including washing, rinsing, drying)? 2  Help from another person to put on and taking off regular upper body clothing? 3  Help from another person to put on and taking off regular lower body clothing? 2  6 Click Score 16  ADL G Code Conversion CK  OT Goal Progression  Progress towards OT goals Progressing toward goals  OT Time Calculation  OT Start Time (ACUTE ONLY) 1048  OT Stop Time (ACUTE ONLY) 1103  OT Time Calculation (min) 15 min  OT General Charges  $OT Visit 1 Visit  OT Treatments  $Self Care/Home Management  8-22 mins     05/09/18 1100  OT Visit Information  Last OT Received On 05/09/18  Assistance Needed +1  History of Present Illness L TKA, postop nausea. H/O DM, IBS, back surgery  Precautions  Precautions Knee;Fall  Required Braces or Orthoses Knee Immobilizer - Left  Knee Immobilizer - Left Discontinue once straight leg raise with < 10 degree lag  Pain Assessment  Pain Score 2  Pain Location left knee  Pain Descriptors / Indicators Discomfort;Sore  Pain Intervention(s) Limited activity within patient's tolerance  Cognition  Arousal/Alertness Awake/alert  Behavior During Therapy WFL for tasks assessed/performed  Overall Cognitive Status Within Functional Limits for tasks assessed  ADL  General ADL Comments family present. Educated them on shower transfer and applying [place].  family states they will have a shower seat prior to pt taking a shower. Educated on placement and using  RW to get to seat if chair not directly behind her against the wall.  Educated on [place] and removal of this when sitting for shower.  Demonstrated sidestepping through bathroom door as doorway is not wide enough for RW. Reinforced need for gait belt when walking, having pt scoot forward on commode and making sure that RW is out slightly in front of body when standing due to pt's  tendency to lose balance posteriorly.  Also, if KI used, to use stool or trash can to support it, as it won't touch floor.   Family verbalizes understanding of all education  Restrictions  Other Position/Activity Restrictions WBAT  OT - End of Session  Activity Tolerance Patient tolerated treatment well  Patient left in chair;with call bell/phone within reach;with chair alarm set  OT Assessment/Plan  Follow Up Recommendations Supervision/Assistance - 24 hour  OT Equipment  (family will get shower seat)  AM-PAC OT "6 Clicks" Daily Activity Outcome Measure  Help from another person eating meals? 4  Help from another person taking care of personal grooming? 3  Help from another person toileting, which includes using toliet, bedpan, or urinal? 2  Help from another person bathing (including washing, rinsing, drying)? 2  Help from another person to put on and taking off regular upper body clothing? 3  Help from another person to put on and taking off regular lower body clothing? 2  6 Click Score 16  ADL G Code Conversion CK  OT Goal Progression  Progress towards OT goals Progressing toward goals  OT Time Calculation  OT Start Time (ACUTE ONLY) 1048  OT Stop Time (ACUTE ONLY) 1103  OT Time Calculation (min) 15 min  OT General Charges  $OT Visit 1 Visit  OT Treatments  $Self Care/Home Management  8-22 mins  Lesle Chris, OTR/L Acute Rehabilitation Services [phone] [facility] pager [phone] office 05/09/2018

## 2018-05-11 ENCOUNTER — Encounter: Payer: Self-pay | Admitting: Physical Therapy

## 2018-05-11 ENCOUNTER — Ambulatory Visit: Payer: Medicare Other | Attending: Orthopedic Surgery | Admitting: Physical Therapy

## 2018-05-11 ENCOUNTER — Other Ambulatory Visit: Payer: Self-pay

## 2018-05-11 DIAGNOSIS — M25662 Stiffness of left knee, not elsewhere classified: Secondary | ICD-10-CM | POA: Diagnosis not present

## 2018-05-11 DIAGNOSIS — R600 Localized edema: Secondary | ICD-10-CM

## 2018-05-11 DIAGNOSIS — G8929 Other chronic pain: Secondary | ICD-10-CM

## 2018-05-11 DIAGNOSIS — M25562 Pain in left knee: Secondary | ICD-10-CM | POA: Diagnosis not present

## 2018-05-11 DIAGNOSIS — M6281 Muscle weakness (generalized): Secondary | ICD-10-CM | POA: Insufficient documentation

## 2018-05-11 NOTE — Therapy (Signed)
[facility] [place], [place], [address] Phone: [phone]   Fax:  [phone]  Physical Therapy Evaluation  Patient Details  Name: Carolyn [name] MRN: [id number] Date of Birth: [birth year] Referring Provider (PT): Gaynelle Arabian MD.   Encounter Date: 05/11/2018  PT End of Session - 05/11/18 1405    Visit Number  1    Number of Visits  12    Date for PT Re-Evaluation  06/08/18    Authorization Type  FOTO AT LEAST EVERY 5TH VISIT, 10TH VISIT PROGRESS NOTE AND KX MODIFIER AFTER THE 15 VISIT.    PT Start Time  1026    PT Stop Time  1101    PT Time Calculation (min)  35 min    Equipment Utilized During Treatment  Left knee immobilizer;Gait belt    Activity Tolerance  Patient tolerated treatment well    Behavior During Therapy  WFL for tasks assessed/performed       Past Medical History:  Diagnosis Date  . Anxiety   . Arthritis   . Complication of anesthesia    " my heart rate gets low"  . Depression   . Diabetes mellitus ([place])    type 2  . GERD (gastroesophageal reflux disease)   . Hepatic steatosis   . Hyperlipidemia   . Hyperparathyroidism, primary ([place])   . Hypertension   . IBS (irritable bowel syndrome)   . Internal hemorrhoid   . Tubular adenoma of colon 1999   colonoscopy  . Wears dentures   . Wears glasses     Past Surgical History:  Procedure Laterality Date  . ANKLE FRACTURE SURGERY Left yrs ago  . BACK SURGERY  04/2011   lower fpr spinal stenosis and ruptured disc  . COLONOSCOPY W/ BIOPSIES AND POLYPECTOMY    . PARATHYROIDECTOMY Right 04/27/2017   Procedure: RIGHT PARATHYROIDECTOMY;  Surgeon: Armandina Gemma, MD;  Location: [facility];  Service: General;  Laterality: Right;  . TONSILLECTOMY  as child   and adenoids  . TOTAL KNEE ARTHROPLASTY Left 05/07/2018   Procedure: LEFT TOTAL KNEE ARTHROPLASTY;  Surgeon: Gaynelle Arabian, MD;  Location: [facility];  Service: Orthopedics;  Laterality: Left;  50 mins  .  VAGINAL HYSTERECTOMY  1964   partial    There were no vitals filed for this visit.   Subjective Assessment - 05/11/18 1411    Subjective  The patient presents to the clinic s/p left total knee replacement performed on 05/07/18.  She is pleased with her progress thus far.  She states her daughter-in-law is helping her with her HEP.  Her resting pain-level is a 4/10 today.  Her pain increases with movement.      Pertinent History  Left ankle fracture; IBS; DM.    Limitations  Walking    How long can you walk comfortably?  Short distances around home.    Patient Stated Goals  Get out of pain.    Currently in Pain?  Yes    Pain Score  4     Pain Location  Knee    Pain Orientation  Left    Pain Descriptors / Indicators  Aching;Throbbing;Sore    Pain Type  Surgical pain    Pain Onset  1 to 4 weeks ago    Pain Frequency  Constant    Aggravating Factors   See above.    Pain Relieving Factors  See above.         [facility] PT Assessment - 05/11/18 0001  Assessment   Medical Diagnosis  Left total knee replacement.    Referring Provider (PT)  Gaynelle Arabian MD.    Onset Date/Surgical Date  --   05/07/18 (surgery date).     Precautions   Precautions  --   No ultrasound.     Restrictions   Weight Bearing Restrictions  No      Balance Screen   Has the patient fallen in the past 6 months  No    Has the patient had a decrease in activity level because of a fear of falling?   Yes    Is the patient reluctant to leave their home because of a fear of falling?   Yes      [place] residence      Prior Function   Level of Independence  Independent      Observation/Other Assessments   Observations  Post-surgical dressing intact.    Focus on Therapeutic Outcomes (FOTO)   82% limitation.      Observation/Other Assessments-Edema    Edema  Circumferential      Circumferential Edema   Circumferential - Left   LT 4.5 cms > RT.      ROM / Strength    AROM / PROM / Strength  AROM;Strength      AROM   Overall AROM Comments  Left knee AROM:  -6 to 82 degrees.      Strength   Overall Strength Comments  Left hip= 4+/5; left knee= 4/5.      Palpation   Palpation comment  C/o diffuse left anterior knee pain.      Ambulation/Gait   Gait Comments  Patient into clinic with a left knee immobilizer that she states she does not have to wear but choose to today.  She walks safely with a FWW.                Objective measurements completed on examination: See above findings.      [facility] Adult PT Treatment/Exercise - 05/11/18 0001      Modalities   Modalities  Vasopneumatic      Vasopneumatic   Number Minutes Vasopneumatic   10 minutes    Vasopnuematic Location   --   Left knee.   Vasopneumatic Pressure  Low               PT Short Term Goals - 05/11/18 1434      PT SHORT TERM GOAL #1   Title  Ind with an initial HEP.    Time  2    Period  Weeks    Status  New      PT SHORT TERM GOAL #2   Title  Full active left knee extension.    Time  2    Period  Weeks    Status  New        PT Long Term Goals - 05/11/18 1435      PT LONG TERM GOAL #1   Title  Ind with an advanced HEP.    Time  4    Period  Weeks    Status  New      PT LONG TERM GOAL #2   Title  Active left knee flexion to 115 degrees+ so the patient can perform functional tasks and do so with pain not > 2-3/10.    Time  4    Period  Weeks    Status  New  PT LONG TERM GOAL #3   Title  Increase left  knee strength to a solid 4+/5 to provide good stability for accomplishment of functional activities.    Time  4    Period  Weeks    Status  New      PT LONG TERM GOAL #4   Title  Decrease edema to within 2.5 cms of non-affected side to assist with pain reduction and range of motion gains.    Time  4    Period  Weeks    Status  New      PT LONG TERM GOAL #5   Title  Perform a reciprocating stair gait with one railing with pain not >  2-3/10.    Time  4    Period  Weeks    Status  New             Plan - 05/11/18 1428    Clinical Impression Statement  The patient presents to OPPT s/p left total knee replacement performed on 05/07/18.  She is very pleased with her progress thus far and has been compliant to her HEP with the help of her daughter-in-law.  She has an expected loss of left knee range of motion and a significant amount of edema currently.  She is ambulating safely with a FWW and she choose to wear a knee immobilizer today.  her fucntional mobility is currently impaired as well.  Patient will benefit from skilled physical therapy intervention to address pain and deficits.     History and Personal Factors relevant to plan of care:  Left ankle fracture; IBS; DM.    Clinical Presentation  Stable    Clinical Presentation due to:  Good surgicla outcome.    Clinical Decision Making  Low    Rehab Potential  Excellent    PT Frequency  3x / week    PT Duration  4 weeks    PT Treatment/Interventions  ADLs/Self Care Home Management;Cryotherapy;Electrical Stimulation;Therapeutic exercise;Therapeutic activities;Functional mobility training;Neuromuscular re-education;Patient/family education;Manual techniques;Passive range of motion;Vasopneumatic Device    PT Next Visit Plan  Nustep; progress into TKA protocol; VMS to left quads if needed; vasopneumatic.    Consulted and Agree with Plan of Care  Patient       Patient will benefit from skilled therapeutic intervention in order to improve the following deficits and impairments:  Abnormal gait, Pain, Decreased activity tolerance, Increased edema, Decreased range of motion, Decreased strength, Decreased mobility  Visit Diagnosis: Chronic pain of left knee - Plan: PT plan of care cert/re-cert  Stiffness of left knee, not elsewhere classified - Plan: PT plan of care cert/re-cert  Muscle weakness (generalized) - Plan: PT plan of care cert/re-cert  Localized edema - Plan:  PT plan of care cert/re-cert     Problem List Patient Active Problem List   Diagnosis Date Noted  . OA (osteoarthritis) of knee 05/07/2018  . Hyperparathyroidism, primary ([place]) 04/25/2017  . Falls 07/19/2016  . Precordial chest pain 12/23/2014  . Hypercalcemia 12/23/2014  . Diabetes ([place]) 10/23/2013  . HTN (hypertension) 10/23/2013    [place], [place] MPT 05/11/2018, 2:39 PM  [facility] [place], [place], [address] Phone: [phone]   Fax:  [phone]  Name: Carolyn [name] MRN: [id number] Date of Birth: 12-10-1940

## 2018-05-14 ENCOUNTER — Encounter: Payer: Self-pay | Admitting: Physical Therapy

## 2018-05-14 ENCOUNTER — Ambulatory Visit: Payer: Medicare Other | Admitting: Physical Therapy

## 2018-05-14 DIAGNOSIS — R600 Localized edema: Secondary | ICD-10-CM

## 2018-05-14 DIAGNOSIS — M25662 Stiffness of left knee, not elsewhere classified: Secondary | ICD-10-CM

## 2018-05-14 DIAGNOSIS — M25562 Pain in left knee: Secondary | ICD-10-CM | POA: Diagnosis not present

## 2018-05-14 DIAGNOSIS — M6281 Muscle weakness (generalized): Secondary | ICD-10-CM | POA: Diagnosis not present

## 2018-05-14 DIAGNOSIS — G8929 Other chronic pain: Secondary | ICD-10-CM

## 2018-05-14 NOTE — Discharge Summary (Signed)
Physician Discharge Summary   Patient ID: Carolyn [name] MRN: [id number] DOB/AGE: [id number] 78 y.o.  Admit date: 05/07/2018 Discharge date: 05/09/2018  Primary Diagnosis: Osteoarthritis, left knee   Admission Diagnoses:  Past Medical History:  Diagnosis Date  . Anxiety   . Arthritis   . Complication of anesthesia    " my heart rate gets low"  . Depression   . Diabetes mellitus ([place])    type 2  . GERD (gastroesophageal reflux disease)   . Hepatic steatosis   . Hyperlipidemia   . Hyperparathyroidism, primary ([place])   . Hypertension   . IBS (irritable bowel syndrome)   . Internal hemorrhoid   . Tubular adenoma of colon 1999   colonoscopy  . Wears dentures   . Wears glasses    Discharge Diagnoses:   Principal Problem:   OA (osteoarthritis) of knee  Estimated body mass index is 28.19 kg/m as calculated from the following:   Height as of this encounter: '5\' 7"'$  (1.702 m).   Weight as of this encounter: 81.6 kg.  Procedure:  Procedure(s) (LRB): LEFT TOTAL KNEE ARTHROPLASTY (Left)   Consults: None  HPI: Carolyn [name] is a 77 y.o. year old female with end stage OA of her left knee with progressively worsening pain and dysfunction. She has constant pain, with activity and at rest and significant functional deficits with difficulties even with ADLs. She has had extensive non-op management including analgesics, injections of cortisone and viscosupplements, and home exercise program, but remains in significant pain with significant dysfunction. Radiographs show bone on bone arthritis medial and patellofemoral. She presents now for left Total Knee Arthroplasty.    Laboratory Data: Admission on 05/07/2018, Discharged on 05/09/2018  Component Date Value Ref Range Status  . Glucose-Capillary 05/07/2018 218* 70 - 99 mg/dL Final  . Glucose-Capillary 05/07/2018 149* 70 - 99 mg/dL Final  . Comment 1 05/07/2018 Notify RN   Final  . Glucose-Capillary 05/07/2018 200* 70 - 99 mg/dL Final  .  WBC 05/08/2018 6.0  4.0 - 10.5 K/uL Final  . RBC 05/08/2018 3.52* 3.87 - 5.11 MIL/uL Final  . Hemoglobin 05/08/2018 10.7* 12.0 - 15.0 g/dL Final  . HCT 05/08/2018 31.9* 36.0 - 46.0 % Final  . MCV 05/08/2018 90.6  78.0 - 100.0 fL Final  . MCH 05/08/2018 30.4  26.0 - 34.0 pg Final  . MCHC 05/08/2018 33.5  30.0 - 36.0 g/dL Final  . RDW 05/08/2018 12.8  11.5 - 15.5 % Final  . Platelets 05/08/2018 206  150 - 400 K/uL Final   Performed at [facility], [place] [address], [place], [place] [address]  . Sodium 05/08/2018 130* 135 - 145 mmol/L Final  . Potassium 05/08/2018 3.4* 3.5 - 5.1 mmol/L Final  . Chloride 05/08/2018 94* 98 - 111 mmol/L Final  . CO2 05/08/2018 29  22 - 32 mmol/L Final  . Glucose, Bld 05/08/2018 161* 70 - 99 mg/dL Final  . BUN 05/08/2018 12  8 - 23 mg/dL Final  . Creatinine, Ser 05/08/2018 0.81  0.44 - 1.00 mg/dL Final  . Calcium 05/08/2018 8.4* 8.9 - 10.3 mg/dL Final  . GFR calc non Af Amer 05/08/2018 >60  >60 mL/min Final  . GFR calc Af Amer 05/08/2018 >60  >60 mL/min Final   Comment: (NOTE) The eGFR has been calculated using the CKD EPI equation. This calculation has not been validated in all clinical situations. eGFR's persistently <60 mL/min signify possible Chronic Kidney Disease.   Georgiann Hahn gap 05/08/2018  7  5 - 15 Final   Performed at [facility] [address], [place], [place] [address]  . Glucose-Capillary 05/07/2018 207* 70 - 99 mg/dL Final  . Glucose-Capillary 05/07/2018 162* 70 - 99 mg/dL Final  . Glucose-Capillary 05/08/2018 145* 70 - 99 mg/dL Final  . Glucose-Capillary 05/08/2018 184* 70 - 99 mg/dL Final  . WBC 05/09/2018 5.9  4.0 - 10.5 K/uL Final  . RBC 05/09/2018 3.60* 3.87 - 5.11 MIL/uL Final  . Hemoglobin 05/09/2018 10.9* 12.0 - 15.0 g/dL Final  . HCT 05/09/2018 32.2* 36.0 - 46.0 % Final  . MCV 05/09/2018 89.4  78.0 - 100.0 fL Final  . MCH 05/09/2018 30.3  26.0 - 34.0 pg Final  . MCHC 05/09/2018 33.9  30.0 -  36.0 g/dL Final  . RDW 05/09/2018 12.5  11.5 - 15.5 % Final  . Platelets 05/09/2018 225  150 - 400 K/uL Final   Performed at [facility], [place] [address], [place], [place] [address]  . Sodium 05/09/2018 131* 135 - 145 mmol/L Final  . Potassium 05/09/2018 3.9  3.5 - 5.1 mmol/L Final  . Chloride 05/09/2018 92* 98 - 111 mmol/L Final  . CO2 05/09/2018 28  22 - 32 mmol/L Final  . Glucose, Bld 05/09/2018 205* 70 - 99 mg/dL Final  . BUN 05/09/2018 14  8 - 23 mg/dL Final  . Creatinine, Ser 05/09/2018 0.83  0.44 - 1.00 mg/dL Final  . Calcium 05/09/2018 9.2  8.9 - 10.3 mg/dL Final  . GFR calc non Af Amer 05/09/2018 >60  >60 mL/min Final  . GFR calc Af Amer 05/09/2018 >60  >60 mL/min Final   Comment: (NOTE) The eGFR has been calculated using the CKD EPI equation. This calculation has not been validated in all clinical situations. eGFR's persistently <60 mL/min signify possible Chronic Kidney Disease.   Georgiann Hahn gap 05/09/2018 11  5 - 15 Final   Performed at [facility], [place] [address], [place], [place] [address]  . Glucose-Capillary 05/08/2018 224* 70 - 99 mg/dL Final  . Glucose-Capillary 05/08/2018 201* 70 - 99 mg/dL Final  . Comment 1 05/08/2018 Notify RN   Final  . Comment 2 05/08/2018 Document in Chart   Final  . Glucose-Capillary 05/09/2018 172* 70 - 99 mg/dL Final  Hospital Outpatient Visit on 04/30/2018  Component Date Value Ref Range Status  . aPTT 04/30/2018 27  24 - 36 seconds Final   Performed at [facility], [place] [address] [place], [place] [address]  . WBC 04/30/2018 5.3  4.0 - 10.5 K/uL Final  . RBC 04/30/2018 4.34  3.87 - 5.11 MIL/uL Final  . Hemoglobin 04/30/2018 13.1  12.0 - 15.0 g/dL Final  . HCT 04/30/2018 39.6  36.0 - 46.0 % Final  . MCV 04/30/2018 91.2  78.0 - 100.0 fL Final  . MCH 04/30/2018 30.2  26.0 - 34.0 pg Final  . MCHC 04/30/2018 33.1  30.0 - 36.0 g/dL Final  . RDW 04/30/2018 12.8  11.5 - 15.5 % Final    . Platelets 04/30/2018 272  150 - 400 K/uL Final   Performed at [facility], [place] [address], [place], [place] [address]  . Sodium 04/30/2018 135  135 - 145 mmol/L Final  . Potassium 04/30/2018 3.7  3.5 - 5.1 mmol/L Final  . Chloride 04/30/2018 96* 98 - 111 mmol/L Final  . CO2 04/30/2018 31  22 - 32 mmol/L Final  . Glucose, Bld 04/30/2018 122*  70 - 99 mg/dL Final  . BUN 04/30/2018 17  8 - 23 mg/dL Final  . Creatinine, Ser 04/30/2018 0.91  0.44 - 1.00 mg/dL Final  . Calcium 04/30/2018 9.2  8.9 - 10.3 mg/dL Final  . Total Protein 04/30/2018 7.1  6.5 - 8.1 g/dL Final  . Albumin 04/30/2018 4.1  3.5 - 5.0 g/dL Final  . AST 04/30/2018 22  15 - 41 U/L Final  . ALT 04/30/2018 25  0 - 44 U/L Final  . Alkaline Phosphatase 04/30/2018 69  38 - 126 U/L Final  . Total Bilirubin 04/30/2018 0.6  0.3 - 1.2 mg/dL Final  . GFR calc non Af Amer 04/30/2018 59* >60 mL/min Final  . GFR calc Af Amer 04/30/2018 >60  >60 mL/min Final   Comment: (NOTE) The eGFR has been calculated using the CKD EPI equation. This calculation has not been validated in all clinical situations. eGFR's persistently <60 mL/min signify possible Chronic Kidney Disease.   Georgiann Hahn gap 04/30/2018 8  5 - 15 Final   Performed at [facility], [place] [address] [place], [place] [address]  . Prothrombin Time 04/30/2018 11.5  11.4 - 15.2 seconds Final  . INR 04/30/2018 0.84   Final   Performed at [facility], [place] [address], [place], [place] [address]  . ABO/RH(D) 04/30/2018 O POS   Final  . Antibody Screen 04/30/2018 NEG   Final  . Sample Expiration 04/30/2018 05/10/2018   Final  . Extend sample reason 04/30/2018    Final                   Value:NO TRANSFUSIONS OR PREGNANCY IN THE PAST 3 MONTHS Performed at [facility], [place] [address], [place], [place] [address]   . MRSA, PCR 04/30/2018 NEGATIVE  NEGATIVE Final  . Staphylococcus aureus 04/30/2018 NEGATIVE   NEGATIVE Final   Comment: (NOTE) The Xpert SA Assay (FDA approved for NASAL specimens in patients 38 years of age and older), is one component of a comprehensive surveillance program. It is not intended to diagnose infection nor to guide or monitor treatment. Performed at [facility], [place] [address] [place],  [address]   . Glucose-Capillary 04/30/2018 133* 70 - 99 mg/dL Final     X-Rays:No results found.  EKG: Orders placed or performed during the hospital encounter of 04/30/18  . EKG 12 lead  . EKG 12 lead     Hospital Course: Carolyn [name] is a 77 y.o. who was admitted to [facility]. They were brought to the operating room on 05/07/2018 and underwent Procedure(s): LEFT TOTAL KNEE ARTHROPLASTY.  Patient tolerated the procedure well and was later transferred to the recovery room and then to the orthopaedic floor for postoperative care. They were given PO and IV analgesics for pain control following their surgery. They were given 24 hours of postoperative antibiotics of  Anti-infectives (From admission, onward)   Start     Dose/Rate Route Frequency Ordered Stop   05/07/18 1300  ceFAZolin (ANCEF) IVPB 2g/100 mL premix     2 g 200 mL/hr over 30 Minutes Intravenous Every 6 hours 05/07/18 1044 05/07/18 1900   05/07/18 0600  ceFAZolin (ANCEF) IVPB 2g/100 mL premix     2 g 200 mL/hr over 30 Minutes Intravenous On call to O.R. 05/07/18 2458 05/07/18 0715     and started on DVT prophylaxis in the form of Aspirin.   PT and OT were ordered for  total joint protocol. Discharge planning consulted to help with postop disposition and equipment needs. Patient had a good night on the evening of surgery. They started to get up OOB with therapy on POD #0. Hemovac drain was pulled without difficulty on day one. Pt was noted to have low potassium at 3.4, one dose of 40 mEq KCl ordered. Continued to work with therapy into POD #2. Pt was seen during rounds on day two and  was ready to go home pending progress with therapy. Pt had some issues overnight with elevated blood pressure, however this improved throughout day 2. Dressing was changed and the incision was clean, dry, and intact with no drainage. Pt worked with therapy for two additional sessions and was meeting their goals. She was discharged to home later that day in stable condition.  Diet: Diabetic diet Activity: WBAT Follow-up: in 2 weeks with Dr. Wynelle Link Disposition: Home with outpatient physical therapy at [facility] in [place] Discharged Condition: stable   Discharge Instructions    Call MD / Call 911   Complete by:  As directed    If you experience chest pain or shortness of breath, CALL 911 and be transported to the hospital emergency room.  If you develope a fever above 101 F, pus (white drainage) or increased drainage or redness at the wound, or calf pain, call your surgeon's office.   Change dressing   Complete by:  As directed    Change the dressing daily with sterile 4 x 4 inch gauze dressing and apply TED hose.   Constipation Prevention   Complete by:  As directed    Drink plenty of fluids.  Prune juice may be helpful.  You may use a stool softener, such as Colace (over the counter) 100 mg twice a day.  Use MiraLax (over the counter) for constipation as needed.   Diet - low sodium heart healthy   Complete by:  As directed    Discharge instructions   Complete by:  As directed    Dr. Gaynelle Arabian Total Joint Specialist [facility] [address], [place], [place] [address] [phone]  TOTAL KNEE REPLACEMENT POSTOPERATIVE DIRECTIONS  Knee Rehabilitation, Guidelines Following Surgery  Results after knee surgery are often greatly improved when you follow the exercise, range of motion and muscle strengthening exercises prescribed by your doctor. Safety measures are also important to protect the knee from further injury. Any time any of these exercises cause you to have  increased pain or swelling in your knee joint, decrease the amount until you are comfortable again and slowly increase them. If you have problems or questions, call your caregiver or physical therapist for advice.   HOME CARE INSTRUCTIONS  Remove items at home which could result in a fall. This includes throw rugs or furniture in walking pathways.  ICE to the affected knee every three hours for 30 minutes at a time and then as needed for pain and swelling.  Continue to use ice on the knee for pain and swelling from surgery. You may notice swelling that will progress down to the foot and ankle.  This is normal after surgery.  Elevate the leg when you are not up walking on it.   Continue to use the breathing machine which will help keep your temperature down.  It is common for your temperature to cycle up and down following surgery, especially at night when you are not up moving around and exerting yourself.  The breathing machine keeps your lungs  expanded and your temperature down. Do not place pillow under knee, focus on keeping the knee straight while resting   DIET You may resume your previous home diet once your are discharged from the hospital.  DRESSING / WOUND CARE / SHOWERING You may shower 3 days after surgery, but keep the wounds dry during showering.  You may use an occlusive plastic wrap (Press'n Seal for example), NO SOAKING/SUBMERGING IN THE BATHTUB.  If the bandage gets wet, change with a clean dry gauze.  If the incision gets wet, pat the wound dry with a clean towel. You may start showering once you are discharged home but do not submerge the incision under water. Just pat the incision dry and apply a dry gauze dressing on daily. Change the surgical dressing daily and reapply a dry dressing each time.  ACTIVITY Walk with your walker as instructed. Use walker as long as suggested by your caregivers. Avoid periods of inactivity such as sitting longer than an hour when not asleep.  This helps prevent blood clots.  You may resume a sexual relationship in one month or when given the OK by your doctor.  You may return to work once you are cleared by your doctor.  Do not drive a car for 6 weeks or until released by you surgeon.  Do not drive while taking narcotics.  WEIGHT BEARING Weight bearing as tolerated with assist device (walker, cane, etc) as directed, use it as long as suggested by your surgeon or therapist, typically at least 4-6 weeks.  POSTOPERATIVE CONSTIPATION PROTOCOL Constipation - defined medically as fewer than three stools per week and severe constipation as less than one stool per week.  One of the most common issues patients have following surgery is constipation.  Even if you have a regular bowel pattern at home, your normal regimen is likely to be disrupted due to multiple reasons following surgery.  Combination of anesthesia, postoperative narcotics, change in appetite and fluid intake all can affect your bowels.  In order to avoid complications following surgery, here are some recommendations in order to help you during your recovery period.  Colace (docusate) - Pick up an over-the-counter form of Colace or another stool softener and take twice a day as long as you are requiring postoperative pain medications.  Take with a full glass of water daily.  If you experience loose stools or diarrhea, hold the colace until you stool forms back up.  If your symptoms do not get better within 1 week or if they get worse, check with your doctor.  Dulcolax (bisacodyl) - Pick up over-the-counter and take as directed by the product packaging as needed to assist with the movement of your bowels.  Take with a full glass of water.  Use this product as needed if not relieved by Colace only.   MiraLax (polyethylene glycol) - Pick up over-the-counter to have on hand.  MiraLax is a solution that will increase the amount of water in your bowels to assist with bowel movements.   Take as directed and can mix with a glass of water, juice, soda, coffee, or tea.  Take if you go more than two days without a movement. Do not use MiraLax more than once per day. Call your doctor if you are still constipated or irregular after using this medication for 7 days in a row.  If you continue to have problems with postoperative constipation, please contact the office for further assistance and recommendations.  If you experience "the  worst abdominal pain ever" or develop nausea or vomiting, please contact the office immediatly for further recommendations for treatment.  ITCHING  If you experience itching with your medications, try taking only a single pain pill, or even half a pain pill at a time.  You can also use Benadryl over the counter for itching or also to help with sleep.   TED HOSE STOCKINGS Wear the elastic stockings on both legs for three weeks following surgery during the day but you may remove then at night for sleeping.  MEDICATIONS See your medication summary on the "After Visit Summary" that the nursing staff will review with you prior to discharge.  You may have some home medications which will be placed on hold until you complete the course of blood thinner medication.  It is important for you to complete the blood thinner medication as prescribed by your surgeon.  Continue your approved medications as instructed at time of discharge.  PRECAUTIONS If you experience chest pain or shortness of breath - call 911 immediately for transfer to the hospital emergency department.  If you develop a fever greater that 101 F, purulent drainage from wound, increased redness or drainage from wound, foul odor from the wound/dressing, or calf pain - CONTACT YOUR SURGEON.                                                   FOLLOW-UP APPOINTMENTS Make sure you keep all of your appointments after your operation with your surgeon and caregivers. You should call the office at the above phone  number and make an appointment for approximately two weeks after the date of your surgery or on the date instructed by your surgeon outlined in the "After Visit Summary".   RANGE OF MOTION AND STRENGTHENING EXERCISES  Rehabilitation of the knee is important following a knee injury or an operation. After just a few days of immobilization, the muscles of the thigh which control the knee become weakened and shrink (atrophy). Knee exercises are designed to build up the tone and strength of the thigh muscles and to improve knee motion. Often times heat used for twenty to thirty minutes before working out will loosen up your tissues and help with improving the range of motion but do not use heat for the first two weeks following surgery. These exercises can be done on a training (exercise) mat, on the floor, on a table or on a bed. Use what ever works the best and is most comfortable for you Knee exercises include:  Leg Lifts - While your knee is still immobilized in a splint or cast, you can do straight leg raises. Lift the leg to 60 degrees, hold for 3 sec, and slowly lower the leg. Repeat 10-20 times 2-3 times daily. Perform this exercise against resistance later as your knee gets better.  Quad and Hamstring Sets - Tighten up the muscle on the front of the thigh (Quad) and hold for 5-10 sec. Repeat this 10-20 times hourly. Hamstring sets are done by pushing the foot backward against an object and holding for 5-10 sec. Repeat as with quad sets.  Leg Slides: Lying on your back, slowly slide your foot toward your buttocks, bending your knee up off the floor (only go as far as is comfortable). Then slowly slide your foot back down until your leg is  flat on the floor again. Angel Wings: Lying on your back spread your legs to the side as far apart as you can without causing discomfort.  A rehabilitation program following serious knee injuries can speed recovery and prevent re-injury in the future due to weakened  muscles. Contact your doctor or a physical therapist for more information on knee rehabilitation.   IF YOU ARE TRANSFERRED TO A SKILLED REHAB FACILITY If the patient is transferred to a skilled rehab facility following release from the hospital, a list of the current medications will be sent to the facility for the patient to continue.  When discharged from the skilled rehab facility, please have the facility set up the patient's [place] prior to being released. Also, the skilled facility will be responsible for providing the patient with their medications at time of release from the facility to include their pain medication, the muscle relaxants, and their blood thinner medication. If the patient is still at the rehab facility at time of the two week follow up appointment, the skilled rehab facility will also need to assist the patient in arranging follow up appointment in our office and any transportation needs.  MAKE SURE YOU:  Understand these instructions.  Get help right away if you are not doing well or get worse.    Pick up stool softner and laxative for home use following surgery while on pain medications. Do not submerge incision under water. Please use good hand washing techniques while changing dressing each day. May shower starting three days after surgery. Please use a clean towel to pat the incision dry following showers. Continue to use ice for pain and swelling after surgery. Do not use any lotions or creams on the incision until instructed by your surgeon.   Do not put a pillow under the knee. Place it under the heel.   Complete by:  As directed    Driving restrictions   Complete by:  As directed    No driving for two weeks   TED hose   Complete by:  As directed    Use stockings (TED hose) for three weeks on both leg(s).  You may remove them at night for sleeping.   Weight bearing as tolerated   Complete by:  As directed      Allergies as of  05/09/2018      Reactions   Sulfa Antibiotics Rash      Medication List    STOP taking these medications   celecoxib 200 MG capsule Commonly known as:  CELEBREX     TAKE these medications   acetaminophen 500 MG tablet Commonly known as:  TYLENOL Take 1,000 mg by mouth every 6 (six) hours as needed for mild pain.   aspirin 325 MG EC tablet Take 1 tablet (325 mg total) by mouth 2 (two) times daily for 19 days. Take one tablet (325 mg) Aspirin two times a day for three weeks following surgery. Then take one baby Aspirin (81 mg) once a day for three weeks. Then discontinue aspirin.   citalopram 20 MG tablet Commonly known as:  CELEXA Take 20 mg by mouth daily with breakfast.   loratadine 10 MG tablet Commonly known as:  CLARITIN Take 10 mg by mouth daily.   losartan-hydrochlorothiazide 100-12.5 MG tablet Commonly known as:  HYZAAR Take 1 tablet by mouth daily with breakfast.   metFORMIN 500 MG 24 hr tablet Commonly known as:  GLUCOPHAGE-XR Take 500-1,000 mg by mouth See admin instructions. Take  2 tablets in the morning, and 1 tablet in the evening.   methocarbamol 500 MG tablet Commonly known as:  ROBAXIN Take 1 tablet (500 mg total) by mouth every 6 (six) hours as needed for muscle spasms.   omeprazole 20 MG capsule Commonly known as:  PRILOSEC Take 20 mg by mouth daily before breakfast.   oxyCODONE 5 MG immediate release tablet Commonly known as:  Oxy IR/ROXICODONE Take 1-2 tablets (5-10 mg total) by mouth every 6 (six) hours as needed for moderate pain (pain score 4-6).   SALONPAS PAIN RELIEF PATCH EX Apply 1 patch topically daily as needed (pain).   SYSTANE 0.4-0.3 % Soln Generic drug:  Polyethyl Glycol-Propyl Glycol Place 1 drop into both eyes at bedtime.            Discharge Care Instructions  (From admission, onward)         Start     Ordered   05/09/18 0000  Weight bearing as tolerated     05/09/18 0721   05/09/18 0000  Change dressing      Comments:  Change the dressing daily with sterile 4 x 4 inch gauze dressing and apply TED hose.   05/09/18 6759         Follow-up Information    Gaynelle Arabian, MD. Schedule an appointment as soon as possible for a visit on 05/22/2018.   Specialty:  Orthopedic Surgery Contact information: [address] [place] [address] [phone]           Signed: Theresa Duty, PA-C Orthopedic Surgery 05/14/2018, 9:04 AM

## 2018-05-14 NOTE — Therapy (Signed)
[facility], [place], [address] Phone: [phone]   Fax:  [phone]  Physical Therapy Treatment  Patient Details  Name: Carolyn [name] MRN: [id number] Date of Birth: 1940-11-11 Referring Provider (PT): Gaynelle Arabian MD.   Encounter Date: 05/14/2018  PT End of Session - 05/14/18 1357    Visit Number  2    Number of Visits  12    Date for PT Re-Evaluation  06/08/18    Authorization Type  FOTO AT LEAST EVERY 5TH VISIT, 10TH VISIT PROGRESS NOTE AND KX MODIFIER AFTER THE 15 VISIT.    PT Start Time  1346    PT Stop Time  1436    PT Time Calculation (min)  50 min    Equipment Utilized During Treatment  Gait belt;Other (comment)   FWW   Activity Tolerance  Patient tolerated treatment well    Behavior During Therapy  WFL for tasks assessed/performed       Past Medical History:  Diagnosis Date  . Anxiety   . Arthritis   . Complication of anesthesia    " my heart rate gets low"  . Depression   . Diabetes mellitus ([place])    type 2  . GERD (gastroesophageal reflux disease)   . Hepatic steatosis   . Hyperlipidemia   . Hyperparathyroidism, primary ([place])   . Hypertension   . IBS (irritable bowel syndrome)   . Internal hemorrhoid   . Tubular adenoma of colon 1999   colonoscopy  . Wears dentures   . Wears glasses     Past Surgical History:  Procedure Laterality Date  . ANKLE FRACTURE SURGERY Left yrs ago  . BACK SURGERY  04/2011   lower fpr spinal stenosis and ruptured disc  . COLONOSCOPY W/ BIOPSIES AND POLYPECTOMY    . PARATHYROIDECTOMY Right 04/27/2017   Procedure: RIGHT PARATHYROIDECTOMY;  Surgeon: Armandina Gemma, MD;  Location: [facility];  Service: General;  Laterality: Right;  . TONSILLECTOMY  as child   and adenoids  . TOTAL KNEE ARTHROPLASTY Left 05/07/2018   Procedure: LEFT TOTAL KNEE ARTHROPLASTY;  Surgeon: Gaynelle Arabian, MD;  Location: [facility];  Service: Orthopedics;  Laterality: Left;  50 mins  . VAGINAL  HYSTERECTOMY  1964   partial    There were no vitals filed for this visit.  Subjective Assessment - 05/14/18 1350    Subjective  Reports that she has a lot of her pain along the medial knee. Reports that she took tylenol this morning as she doesn't want to take much of the oxycodone.    Pertinent History  Left ankle fracture; IBS; DM.    Limitations  Walking    How long can you walk comfortably?  Short distances around home.    Patient Stated Goals  Get out of pain.    Currently in Pain?  Yes    Pain Score  6     Pain Location  Knee    Pain Orientation  Left    Pain Descriptors / Indicators  Sore    Pain Type  Surgical pain    Pain Onset  1 to 4 weeks ago         [facility] PT Assessment - 05/14/18 0001      Assessment   Medical Diagnosis  Left total knee replacement.    Onset Date/Surgical Date  05/07/18    Next MD Visit  05/22/2018      Restrictions   Weight Bearing Restrictions  No  ROM / Strength   AROM / PROM / Strength  AROM      AROM   Overall AROM   Deficits    AROM Assessment Site  Knee    Right/Left Knee  Left    Left Knee Extension  6    Left Knee Flexion  95                   [facility] Adult PT Treatment/Exercise - 05/14/18 0001      Exercises   Exercises  Knee/Hip      Knee/Hip Exercises: Aerobic   Nustep  L3, seat 10-9 x12 min      Knee/Hip Exercises: Standing   Forward Lunges  Left;15 reps;2 seconds      Knee/Hip Exercises: Supine   Short Arc Quad Sets  AROM;Left;20 reps    Heel Slides  AROM;Left;15 reps      Modalities   Modalities  Psychologist, educational Location  L knee    Electrical Stimulation Action  IFC    Electrical Stimulation Parameters  80-150 hz x15 min    Electrical Stimulation Goals  Pain;Edema      Vasopneumatic   Number Minutes Vasopneumatic   15 minutes    Vasopnuematic Location   Knee    Vasopneumatic Pressure  Low    Vasopneumatic  Temperature   34      Manual Therapy   Manual Therapy  Passive ROM    Passive ROM  PROM of L knee into flexion with gentle holds and oscillations to promote relaxation               PT Short Term Goals - 05/11/18 1434      PT SHORT TERM GOAL #1   Title  Ind with an initial HEP.    Time  2    Period  Weeks    Status  New      PT SHORT TERM GOAL #2   Title  Full active left knee extension.    Time  2    Period  Weeks    Status  New        PT Long Term Goals - 05/11/18 1435      PT LONG TERM GOAL #1   Title  Ind with an advanced HEP.    Time  4    Period  Weeks    Status  New      PT LONG TERM GOAL #2   Title  Active left knee flexion to 115 degrees+ so the patient can perform functional tasks and do so with pain not > 2-3/10.    Time  4    Period  Weeks    Status  New      PT LONG TERM GOAL #3   Title  Increase left  knee strength to a solid 4+/5 to provide good stability for accomplishment of functional activities.    Time  4    Period  Weeks    Status  New      PT LONG TERM GOAL #4   Title  Decrease edema to within 2.5 cms of non-affected side to assist with pain reduction and range of motion gains.    Time  4    Period  Weeks    Status  New      PT LONG TERM GOAL #5   Title  Perform a reciprocating stair gait with one railing  with pain not > 2-3/10.    Time  4    Period  Weeks    Status  New            Plan - 05/14/18 1421    Clinical Impression Statement  Patient tolerated today's treatment fairly well as she arrived in clinic with 6/10 L knee soreness. Patient educated that taking pain medication prior to PT session will help with pain during PT. Patient able to complete low level L knee ROM and strengthening exercises with increased pain reported at end range knee flexion. Patient educated that the pain, soreness, and bruising are all typical following TKR. AROM of L knee measured as 6-95 deg today in supine. Normal modalities response  noted following removal of the modalities. Post surgical gauze still applied over the incision with steristrips visible directly over the incision. Patient ambulating with FWW and presented with B TED hose donned. Patient also educated during her PT to not allow wrinkles in TED hose and to make sure they are fully extended over LEs.    Rehab Potential  Excellent    PT Frequency  3x / week    PT Duration  4 weeks    PT Treatment/Interventions  ADLs/Self Care Home Management;Cryotherapy;Electrical Stimulation;Therapeutic exercise;Therapeutic activities;Functional mobility training;Neuromuscular re-education;Patient/family education;Manual techniques;Passive range of motion;Vasopneumatic Device    PT Next Visit Plan  Nustep; progress into TKA protocol; VMS to left quads if needed; vasopneumatic.    Consulted and Agree with Plan of Care  Patient       Patient will benefit from skilled therapeutic intervention in order to improve the following deficits and impairments:  Abnormal gait, Pain, Decreased activity tolerance, Increased edema, Decreased range of motion, Decreased strength, Decreased mobility  Visit Diagnosis: Chronic pain of left knee  Stiffness of left knee, not elsewhere classified  Muscle weakness (generalized)  Localized edema     Problem List Patient Active Problem List   Diagnosis Date Noted  . OA (osteoarthritis) of knee 05/07/2018  . Hyperparathyroidism, primary ([place]) 04/25/2017  . Falls 07/19/2016  . Precordial chest pain 12/23/2014  . Hypercalcemia 12/23/2014  . Diabetes ([place]) 10/23/2013  . HTN (hypertension) 10/23/2013    Standley Brooking, PTA 05/14/2018, 2:45 PM  [facility] [address] [place], [place], [address] Phone: [phone]   Fax:  [phone]  Name: Carolyn [name] MRN: [id number] Date of Birth: [birth year]

## 2018-05-16 ENCOUNTER — Encounter: Payer: Self-pay | Admitting: Physical Therapy

## 2018-05-16 ENCOUNTER — Ambulatory Visit: Payer: Medicare Other | Admitting: Physical Therapy

## 2018-05-16 DIAGNOSIS — R600 Localized edema: Secondary | ICD-10-CM | POA: Diagnosis not present

## 2018-05-16 DIAGNOSIS — G8929 Other chronic pain: Secondary | ICD-10-CM

## 2018-05-16 DIAGNOSIS — M25562 Pain in left knee: Secondary | ICD-10-CM | POA: Diagnosis not present

## 2018-05-16 DIAGNOSIS — M25662 Stiffness of left knee, not elsewhere classified: Secondary | ICD-10-CM | POA: Diagnosis not present

## 2018-05-16 DIAGNOSIS — M6281 Muscle weakness (generalized): Secondary | ICD-10-CM | POA: Diagnosis not present

## 2018-05-16 NOTE — Therapy (Signed)
[facility] [place], [place], [address] Phone: [phone]   Fax:  [phone]  Physical Therapy Treatment  Patient Details  Name: Carolyn [name] MRN: [id number] Date of Birth: 1941-05-06 Referring Provider (PT): Gaynelle Arabian MD.   Encounter Date: 05/16/2018  PT End of Session - 05/16/18 1338    Visit Number  3    Number of Visits  12    Date for PT Re-Evaluation  06/08/18    Authorization Type  FOTO AT LEAST EVERY 5TH VISIT, 10TH VISIT PROGRESS NOTE AND KX MODIFIER AFTER THE 15 VISIT.    PT Start Time  1245    PT Stop Time  1350    PT Time Calculation (min)  65 min    Activity Tolerance  Patient tolerated treatment well    Behavior During Therapy  WFL for tasks assessed/performed       Past Medical History:  Diagnosis Date  . Anxiety   . Arthritis   . Complication of anesthesia    " my heart rate gets low"  . Depression   . Diabetes mellitus ([place])    type 2  . GERD (gastroesophageal reflux disease)   . Hepatic steatosis   . Hyperlipidemia   . Hyperparathyroidism, primary ([place])   . Hypertension   . IBS (irritable bowel syndrome)   . Internal hemorrhoid   . Tubular adenoma of colon 1999   colonoscopy  . Wears dentures   . Wears glasses     Past Surgical History:  Procedure Laterality Date  . ANKLE FRACTURE SURGERY Left yrs ago  . BACK SURGERY  04/2011   lower fpr spinal stenosis and ruptured disc  . COLONOSCOPY W/ BIOPSIES AND POLYPECTOMY    . PARATHYROIDECTOMY Right 04/27/2017   Procedure: RIGHT PARATHYROIDECTOMY;  Surgeon: Armandina Gemma, MD;  Location: [facility];  Service: General;  Laterality: Right;  . TONSILLECTOMY  as child   and adenoids  . TOTAL KNEE ARTHROPLASTY Left 05/07/2018   Procedure: LEFT TOTAL KNEE ARTHROPLASTY;  Surgeon: Gaynelle Arabian, MD;  [facility];  Service: Orthopedics;  Laterality: Left;  50 mins  . VAGINAL HYSTERECTOMY  1964   partial    There were no vitals filed for this  visit.  Subjective Assessment - 05/16/18 1249    Subjective  No new complaints.    How long can you walk comfortably?  Short distances around home.    Patient Stated Goals  Get out of pain.    Currently in Pain?  Yes    Pain Score  4     Pain Location  Knee    Pain Orientation  Left    Pain Descriptors / Indicators  Sore    Pain Type  Surgical pain    Pain Onset  1 to 4 weeks ago                       [facility] Adult PT Treatment/Exercise - 05/16/18 0001      Exercises   Exercises  Knee/Hip      Knee/Hip Exercises: Aerobic   Nustep  Level 3 moving forward x 2 to increase knee flexion x 15 minutes.      Knee/Hip Exercises: Supine   Other Supine Knee/Hip Exercises  SAQ's (non-resisted) facilitated with VMS to left medial quads x 16 minutes (10 sec extension hold and 10 sec rest).      Vasopneumatic   Number Minutes Vasopneumatic   20 minutes  Left knee.   Vasopnuematic Location   --   Left knee.   Vasopneumatic Pressure  Low               PT Short Term Goals - 05/11/18 1434      PT SHORT TERM GOAL #1   Title  Ind with an initial HEP.    Time  2    Period  Weeks    Status  New      PT SHORT TERM GOAL #2   Title  Full active left knee extension.    Time  2    Period  Weeks    Status  New        PT Long Term Goals - 05/11/18 1435      PT LONG TERM GOAL #1   Title  Ind with an advanced HEP.    Time  4    Period  Weeks    Status  New      PT LONG TERM GOAL #2   Title  Active left knee flexion to 115 degrees+ so the patient can perform functional tasks and do so with pain not > 2-3/10.    Time  4    Period  Weeks    Status  New      PT LONG TERM GOAL #3   Title  Increase left  knee strength to a solid 4+/5 to provide good stability for accomplishment of functional activities.    Time  4    Period  Weeks    Status  New      PT LONG TERM GOAL #4   Title  Decrease edema to within 2.5 cms of non-affected side to assist with pain  reduction and range of motion gains.    Time  4    Period  Weeks    Status  New      PT LONG TERM GOAL #5   Title  Perform a reciprocating stair gait with one railing with pain not > 2-3/10.    Time  4    Period  Weeks    Status  New            Plan - 05/16/18 1336    Clinical Impression Statement  Patient did very well today.  Very good left quad contraction with VMS.  Achieved flexion to 97 degrees today.      PT Next Visit Plan  Nustep; progress into TKA protocol; VMS to left quads if needed; vasopneumatic.    Consulted and Agree with Plan of Care  Patient       Patient will benefit from skilled therapeutic intervention in order to improve the following deficits and impairments:  Abnormal gait, Pain, Decreased activity tolerance, Increased edema, Decreased range of motion, Decreased strength, Decreased mobility  Visit Diagnosis: Chronic pain of left knee  Stiffness of left knee, not elsewhere classified  Localized edema  Muscle weakness (generalized)     Problem List Patient Active Problem List   Diagnosis Date Noted  . OA (osteoarthritis) of knee 05/07/2018  . Hyperparathyroidism, primary ([place]) 04/25/2017  . Falls 07/19/2016  . Precordial chest pain 12/23/2014  . Hypercalcemia 12/23/2014  . Diabetes ([place]) 10/23/2013  . HTN (hypertension) 10/23/2013    Ravi Tuccillo, [place] MPT 05/16/2018, 1:52 PM  [facility] [address] [place], [place], [address] Phone: [phone]   Fax:  [phone]  Name: Carolyn [name] MRN: [id number] Date of Birth: 1940-12-09

## 2018-05-18 ENCOUNTER — Ambulatory Visit: Payer: Medicare Other | Admitting: *Deleted

## 2018-05-18 DIAGNOSIS — M25562 Pain in left knee: Secondary | ICD-10-CM | POA: Diagnosis not present

## 2018-05-18 DIAGNOSIS — M25662 Stiffness of left knee, not elsewhere classified: Secondary | ICD-10-CM

## 2018-05-18 DIAGNOSIS — R600 Localized edema: Secondary | ICD-10-CM | POA: Diagnosis not present

## 2018-05-18 DIAGNOSIS — G8929 Other chronic pain: Secondary | ICD-10-CM | POA: Diagnosis not present

## 2018-05-18 DIAGNOSIS — M6281 Muscle weakness (generalized): Secondary | ICD-10-CM

## 2018-05-18 NOTE — Therapy (Signed)
[facility] [place], [place], [address] Phone: [phone]   Fax:  [phone]  Physical Therapy Treatment  Patient Details  Name: Carolyn [name] MRN: [id number] Date of Birth: 1941/04/22 Referring Provider (PT): Gaynelle Arabian MD.   Encounter Date: 05/18/2018  PT End of Session - 05/18/18 1123    Visit Number  4    Number of Visits  12    Date for PT Re-Evaluation  06/08/18    Authorization Type  FOTO AT LEAST EVERY 5TH VISIT, 10TH VISIT PROGRESS NOTE AND KX MODIFIER AFTER THE 15 VISIT.    PT Start Time  1115    PT Stop Time  1213    PT Time Calculation (min)  58 min       Past Medical History:  Diagnosis Date  . Anxiety   . Arthritis   . Complication of anesthesia    " my heart rate gets low"  . Depression   . Diabetes mellitus ([place])    type 2  . GERD (gastroesophageal reflux disease)   . Hepatic steatosis   . Hyperlipidemia   . Hyperparathyroidism, primary ([place])   . Hypertension   . IBS (irritable bowel syndrome)   . Internal hemorrhoid   . Tubular adenoma of colon 1999   colonoscopy  . Wears dentures   . Wears glasses     Past Surgical History:  Procedure Laterality Date  . ANKLE FRACTURE SURGERY Left yrs ago  . BACK SURGERY  04/2011   lower fpr spinal stenosis and ruptured disc  . COLONOSCOPY W/ BIOPSIES AND POLYPECTOMY    . PARATHYROIDECTOMY Right 04/27/2017   Procedure: RIGHT PARATHYROIDECTOMY;  Surgeon: Armandina Gemma, MD;  Location: [facility];  Service: General;  Laterality: Right;  . TONSILLECTOMY  as child   and adenoids  . TOTAL KNEE ARTHROPLASTY Left 05/07/2018   Procedure: LEFT TOTAL KNEE ARTHROPLASTY;  Surgeon: Gaynelle Arabian, MD;  [facility];  Service: Orthopedics;  Laterality: Left;  50 mins  . VAGINAL HYSTERECTOMY  1964   partial    There were no vitals filed for this visit.                    [facility] Adult PT Treatment/Exercise - 05/18/18 0001      Exercises   Exercises  Knee/Hip      Knee/Hip Exercises: Aerobic   Nustep  Level 3 moving seat forward 12 to 10  to increase knee flexion x 17 minutes.      Knee/Hip Exercises: Supine   Other Supine Knee/Hip Exercises  SAQ's (non-resisted) facilitated with VMS to left medial quads x 10 minutes (10 sec extension hold and 10 sec rest).      Modalities   Modalities  Electrical Stimulation;Vasopneumatic      Vasopneumatic   Number Minutes Vasopneumatic   15 minutes    Vasopnuematic Location   Knee    Vasopneumatic Pressure  Low    Vasopneumatic Temperature   34      Manual Therapy   Manual Therapy  Passive ROM    Passive ROM  PROM of L knee into flexion and extension  with gentle holds and oscillations to promote relaxation. Flexion to 100 degrees.               PT Short Term Goals - 05/11/18 1434      PT SHORT TERM GOAL #1   Title  Ind with an initial HEP.    Time  2    Period  Weeks    Status  New      PT SHORT TERM GOAL #2   Title  Full active left knee extension.    Time  2    Period  Weeks    Status  New        PT Long Term Goals - 05/11/18 1435      PT LONG TERM GOAL #1   Title  Ind with an advanced HEP.    Time  4    Period  Weeks    Status  New      PT LONG TERM GOAL #2   Title  Active left knee flexion to 115 degrees+ so the patient can perform functional tasks and do so with pain not > 2-3/10.    Time  4    Period  Weeks    Status  New      PT LONG TERM GOAL #3   Title  Increase left  knee strength to a solid 4+/5 to provide good stability for accomplishment of functional activities.    Time  4    Period  Weeks    Status  New      PT LONG TERM GOAL #4   Title  Decrease edema to within 2.5 cms of non-affected side to assist with pain reduction and range of motion gains.    Time  4    Period  Weeks    Status  New      PT LONG TERM GOAL #5   Title  Perform a reciprocating stair gait with one railing with pain not > 2-3/10.    Time  4    Period   Weeks    Status  New            Plan - 05/18/18 1216    Clinical Impression Statement  Pt arrived today doing fairly well with LT knee. Rx focused on ROM and quad activation with VMS. She did well with improved PROM to 100 degrees today and good muscle activation with VMS. Normal response to VMS today.    Clinical Presentation  Stable    Rehab Potential  Excellent    PT Frequency  3x / week    PT Duration  4 weeks    PT Treatment/Interventions  ADLs/Self Care Home Management;Cryotherapy;Electrical Stimulation;Therapeutic exercise;Therapeutic activities;Functional mobility training;Neuromuscular re-education;Patient/family education;Manual techniques;Passive range of motion;Vasopneumatic Device    PT Next Visit Plan  Nustep; progress into TKA protocol; VMS to left quads if needed; vasopneumatic.    Consulted and Agree with Plan of Care  Patient       Patient will benefit from skilled therapeutic intervention in order to improve the following deficits and impairments:  Abnormal gait, Pain, Decreased activity tolerance, Increased edema, Decreased range of motion, Decreased strength, Decreased mobility  Visit Diagnosis: Chronic pain of left knee  Stiffness of left knee, not elsewhere classified  Localized edema  Muscle weakness (generalized)     Problem List Patient Active Problem List   Diagnosis Date Noted  . OA (osteoarthritis) of knee 05/07/2018  . Hyperparathyroidism, primary ([place]) 04/25/2017  . Falls 07/19/2016  . Precordial chest pain 12/23/2014  . Hypercalcemia 12/23/2014  . Diabetes ([place]) 10/23/2013  . HTN (hypertension) 10/23/2013    Americus Perkey,CHRIS, PTA 05/18/2018, 12:20 PM  [facility] [place], [place], [address] Phone: [phone]   Fax:  [phone]  Name: Carolyn [name] MRN: [id number] Date of Birth:  02/09/1941   

## 2018-05-21 ENCOUNTER — Encounter: Payer: Self-pay | Admitting: Physical Therapy

## 2018-05-21 ENCOUNTER — Ambulatory Visit: Payer: Medicare Other | Admitting: Physical Therapy

## 2018-05-21 DIAGNOSIS — M25662 Stiffness of left knee, not elsewhere classified: Secondary | ICD-10-CM

## 2018-05-21 DIAGNOSIS — M6281 Muscle weakness (generalized): Secondary | ICD-10-CM | POA: Diagnosis not present

## 2018-05-21 DIAGNOSIS — G8929 Other chronic pain: Secondary | ICD-10-CM | POA: Diagnosis not present

## 2018-05-21 DIAGNOSIS — R600 Localized edema: Secondary | ICD-10-CM

## 2018-05-21 DIAGNOSIS — M25562 Pain in left knee: Secondary | ICD-10-CM | POA: Diagnosis not present

## 2018-05-21 NOTE — Therapy (Signed)
[facility] [place], [place], [address] Phone: [phone]   Fax:  [phone]  Physical Therapy Treatment  Patient Details  Name: Carolyn [name] MRN: [id number] Date of Birth: 13-May-1941 Referring Provider (PT): Gaynelle Arabian MD.   Encounter Date: 05/21/2018  PT End of Session - 05/21/18 1145    Visit Number  5    Number of Visits  12    Date for PT Re-Evaluation  06/08/18    Authorization Type  FOTO AT LEAST EVERY 5TH VISIT, 10TH VISIT PROGRESS NOTE AND KX MODIFIER AFTER THE 15 VISIT.    PT Start Time  1116    PT Stop Time  1202    PT Time Calculation (min)  46 min    Activity Tolerance  Patient tolerated treatment well    Behavior During Therapy  WFL for tasks assessed/performed       Past Medical History:  Diagnosis Date  . Anxiety   . Arthritis   . Complication of anesthesia    " my heart rate gets low"  . Depression   . Diabetes mellitus ([place])    type 2  . GERD (gastroesophageal reflux disease)   . Hepatic steatosis   . Hyperlipidemia   . Hyperparathyroidism, primary ([place])   . Hypertension   . IBS (irritable bowel syndrome)   . Internal hemorrhoid   . Tubular adenoma of colon 1999   colonoscopy  . Wears dentures   . Wears glasses     Past Surgical History:  Procedure Laterality Date  . ANKLE FRACTURE SURGERY Left yrs ago  . BACK SURGERY  04/2011   lower fpr spinal stenosis and ruptured disc  . COLONOSCOPY W/ BIOPSIES AND POLYPECTOMY    . PARATHYROIDECTOMY Right 04/27/2017   Procedure: RIGHT PARATHYROIDECTOMY;  Surgeon: Armandina Gemma, MD;  Location: [facility];  Service: General;  Laterality: Right;  . TONSILLECTOMY  as child   and adenoids  . TOTAL KNEE ARTHROPLASTY Left 05/07/2018   Procedure: LEFT TOTAL KNEE ARTHROPLASTY;  Surgeon: Gaynelle Arabian, MD;  Location: [facility];  Service: Orthopedics;  Laterality: Left;  50 mins  . VAGINAL HYSTERECTOMY  1964   partial    There were no vitals filed for this  visit.  Subjective Assessment - 05/21/18 1126    Subjective  Patient arrived with some soreness after doing HEP    Pertinent History  Left ankle fracture; IBS; DM.    Limitations  Walking    How long can you walk comfortably?  Short distances around home.    Patient Stated Goals  Get out of pain.    Currently in Pain?  Yes    Pain Score  4     Pain Location  Knee    Pain Orientation  Left    Pain Descriptors / Indicators  Tightness;Sore    Pain Type  Surgical pain    Pain Onset  1 to 4 weeks ago    Pain Frequency  Constant    Aggravating Factors   increased activty with LE    Pain Relieving Factors  at rest         [facility] PT Assessment - 05/21/18 0001      ROM / Strength   AROM / PROM / Strength  AROM;PROM      AROM   AROM Assessment Site  Knee    Right/Left Knee  Left      PROM   PROM Assessment Site  Knee    Right/Left  Knee  Left    Left Knee Extension  -5    Left Knee Flexion  100                   [facility] Adult PT Treatment/Exercise - 05/21/18 0001      Exercises   Exercises  Knee/Hip      Knee/Hip Exercises: Aerobic   Nustep  46min L3 for ROM      Knee/Hip Exercises: Supine   Other Supine Knee/Hip Exercises  SAQ's (non-resisted) facilitated with VMS to left medial quads x 10 minutes (10 sec extension hold and 10 sec rest).      Vasopneumatic   Number Minutes Vasopneumatic   15 minutes    Vasopnuematic Location   Knee    Vasopneumatic Pressure  Low      Manual Therapy   Manual Therapy  Passive ROM;Soft tissue mobilization    Manual therapy comments  demo for self stretching in sitting for flexion and ext to help patient perform self ROM a different way to provide ease due to soreness currently    Soft tissue mobilization  gentle maual STW to quad and IT band to reduce muscle tightness    Passive ROM  PROM of L knee into flexion and extension  with gentle holds                PT Short Term Goals - 05/21/18 1144      PT SHORT TERM GOAL  #1   Title  Ind with an initial HEP.    Time  2    Period  Weeks    Status  On-going      PT SHORT TERM GOAL #2   Title  Full active left knee extension.    Time  2    Period  Weeks    Status  On-going        PT Long Term Goals - 05/21/18 1144      PT LONG TERM GOAL #1   Title  Ind with an advanced HEP.    Time  4    Period  Weeks    Status  On-going      PT LONG TERM GOAL #2   Title  Active left knee flexion to 115 degrees+ so the patient can perform functional tasks and do so with pain not > 2-3/10.    Time  4    Period  Weeks    Status  On-going      PT LONG TERM GOAL #3   Title  Increase left  knee strength to a solid 4+/5 to provide good stability for accomplishment of functional activities.    Time  4    Period  Weeks    Status  On-going      PT LONG TERM GOAL #4   Title  Decrease edema to within 2.5 cms of non-affected side to assist with pain reduction and range of motion gains.    Time  4    Period  Weeks    Status  On-going      PT LONG TERM GOAL #5   Title  Perform a reciprocating stair gait with one railing with pain not > 2-3/10.    Time  4    Period  Weeks    Status  On-going            Plan - 05/21/18 1154    Clinical Impression Statement  Patient tolerated treatment well today. Patient reported some  soreness from HEP yet better today. Patient has improved ROM in left knee and able to perorm all activities with little discomfort. Patient has been able to get around her home and perform light ADL's, with some difficulty with putting on shoes and not doing any cooking or cleaning. Patient progressing toward goals and going for F/U tomorrow with MD.     Clinical Presentation due to:  58% limitation 5th visit    Rehab Potential  Excellent    PT Frequency  3x / week    PT Duration  4 weeks    PT Treatment/Interventions  ADLs/Self Care Home Management;Cryotherapy;Electrical Stimulation;Therapeutic exercise;Therapeutic activities;Functional mobility  training;Neuromuscular re-education;Patient/family education;Manual techniques;Passive range of motion;Vasopneumatic Device    PT Next Visit Plan  cont with POC and progress per TKA protocol MD tomorrow for F/U    Consulted and Agree with Plan of Care  Patient       Patient will benefit from skilled therapeutic intervention in order to improve the following deficits and impairments:  Abnormal gait, Pain, Decreased activity tolerance, Increased edema, Decreased range of motion, Decreased strength, Decreased mobility  Visit Diagnosis: Chronic pain of left knee  Stiffness of left knee, not elsewhere classified  Localized edema  Muscle weakness (generalized)     Problem List Patient Active Problem List   Diagnosis Date Noted  . OA (osteoarthritis) of knee 05/07/2018  . Hyperparathyroidism, primary ([place]) 04/25/2017  . Falls 07/19/2016  . Precordial chest pain 12/23/2014  . Hypercalcemia 12/23/2014  . Diabetes ([place]) 10/23/2013  . HTN (hypertension) 10/23/2013   Ladean Raya, PTA 05/21/18 12:02 PM  [facility] [place], [place], [address] Phone: [phone]   Fax:  [phone]  Name: Carolyn [name] MRN: [id number] Date of Birth: 04-Feb-1941

## 2018-05-23 ENCOUNTER — Ambulatory Visit: Payer: Medicare Other | Admitting: Physical Therapy

## 2018-05-23 ENCOUNTER — Encounter: Payer: Self-pay | Admitting: Physical Therapy

## 2018-05-23 DIAGNOSIS — M25662 Stiffness of left knee, not elsewhere classified: Secondary | ICD-10-CM

## 2018-05-23 DIAGNOSIS — G8929 Other chronic pain: Secondary | ICD-10-CM | POA: Diagnosis not present

## 2018-05-23 DIAGNOSIS — M25562 Pain in left knee: Secondary | ICD-10-CM | POA: Diagnosis not present

## 2018-05-23 DIAGNOSIS — M6281 Muscle weakness (generalized): Secondary | ICD-10-CM

## 2018-05-23 DIAGNOSIS — R600 Localized edema: Secondary | ICD-10-CM | POA: Diagnosis not present

## 2018-05-23 NOTE — Therapy (Signed)
[facility] [place], [place], [address] Phone: [phone]   Fax:  [phone]  Physical Therapy Treatment  Patient Details  Name: Carolyn [name] MRN: [id number] Date of Birth: November 02, 1940 Referring Provider (PT): Gaynelle Arabian MD.   Encounter Date: 05/23/2018  PT End of Session - 05/23/18 1339    Visit Number  6    Number of Visits  12    Date for PT Re-Evaluation  06/08/18    Authorization Type  FOTO AT LEAST EVERY 5TH VISIT, 10TH VISIT PROGRESS NOTE AND KX MODIFIER AFTER THE 15 VISIT.    PT Start Time  1259    PT Stop Time  1356    PT Time Calculation (min)  57 min    Activity Tolerance  Patient tolerated treatment well    Behavior During Therapy  WFL for tasks assessed/performed       Past Medical History:  Diagnosis Date  . Anxiety   . Arthritis   . Complication of anesthesia    " my heart rate gets low"  . Depression   . Diabetes mellitus ([place])    type 2  . GERD (gastroesophageal reflux disease)   . Hepatic steatosis   . Hyperlipidemia   . Hyperparathyroidism, primary ([place])   . Hypertension   . IBS (irritable bowel syndrome)   . Internal hemorrhoid   . Tubular adenoma of colon 1999   colonoscopy  . Wears dentures   . Wears glasses     Past Surgical History:  Procedure Laterality Date  . ANKLE FRACTURE SURGERY Left yrs ago  . BACK SURGERY  04/2011   lower fpr spinal stenosis and ruptured disc  . COLONOSCOPY W/ BIOPSIES AND POLYPECTOMY    . PARATHYROIDECTOMY Right 04/27/2017   Procedure: RIGHT PARATHYROIDECTOMY;  Surgeon: Armandina Gemma, MD;  Location: [facility];  Service: General;  Laterality: Right;  . TONSILLECTOMY  as child   and adenoids  . TOTAL KNEE ARTHROPLASTY Left 05/07/2018   Procedure: LEFT TOTAL KNEE ARTHROPLASTY;  Surgeon: Gaynelle Arabian, MD;  Location: [facility];  Service: Orthopedics;  Laterality: Left;  50 mins  . VAGINAL HYSTERECTOMY  1964   partial    There were no vitals filed for this  visit.  Subjective Assessment - 05/23/18 1302    Subjective  Patient arrived and doing well per reported, went to MD and is doing great and to cont thrapy, some tightness post knee today    Pertinent History  Left ankle fracture; IBS; DM.    Limitations  Walking    How long can you walk comfortably?  Short distances around home.    Patient Stated Goals  Get out of pain.    Currently in Pain?  Yes    Pain Score  4     Pain Location  Knee    Pain Orientation  Left    Pain Descriptors / Indicators  Tightness;Discomfort    Pain Type  Surgical pain    Pain Onset  1 to 4 weeks ago    Pain Frequency  Constant    Aggravating Factors   increased activity or sitting in one position too long    Pain Relieving Factors  rest and gentle movement or change position         [facility] PT Assessment - 05/23/18 0001      AROM   AROM Assessment Site  Knee    Right/Left Knee  Left    Left Knee Extension  -8  Left Knee Flexion  98      PROM   PROM Assessment Site  Knee    Right/Left Knee  Left    Left Knee Extension  -5    Left Knee Flexion  105                   [facility] Adult PT Treatment/Exercise - 05/23/18 0001      Exercises   Exercises  Knee/Hip      Knee/Hip Exercises: Stretches   Knee: Self-Stretch to increase Flexion  Left;3 reps;30 seconds      Knee/Hip Exercises: Aerobic   Nustep  L4 x78min UE/LE      Knee/Hip Exercises: Standing   Forward Step Up  Left;1 set;10 reps;Hand Hold: 2;Step Height: 4"    Rocker Board  2 minutes      Knee/Hip Exercises: Seated   Long Arc Quad  Strengthening;Left;2 sets;10 reps;Weights    Long Arc Quad Weight  3 lbs.      Theme park manager  L knee    Chartered certified accountant  IFC    Electrical Stimulation Parameters  1-10hz  x20min    Electrical Stimulation Goals  Pain;Edema      Vasopneumatic   Number Minutes Vasopneumatic   15 minutes    Vasopnuematic Location   Knee    Vasopneumatic  Pressure  Low      Manual Therapy   Manual Therapy  Passive ROM;Soft tissue mobilization    Soft tissue mobilization  gentle maual STW to quad /HS and IT band to reduce muscle tightness    Passive ROM  PROM of L knee into flexion and extension  with gentle holds                PT Short Term Goals - 05/21/18 1144      PT SHORT TERM GOAL #1   Title  Ind with an initial HEP.    Time  2    Period  Weeks    Status  On-going      PT SHORT TERM GOAL #2   Title  Full active left knee extension.    Time  2    Period  Weeks    Status  On-going        PT Long Term Goals - 05/21/18 1144      PT LONG TERM GOAL #1   Title  Ind with an advanced HEP.    Time  4    Period  Weeks    Status  On-going      PT LONG TERM GOAL #2   Title  Active left knee flexion to 115 degrees+ so the patient can perform functional tasks and do so with pain not > 2-3/10.    Time  4    Period  Weeks    Status  On-going      PT LONG TERM GOAL #3   Title  Increase left  knee strength to a solid 4+/5 to provide good stability for accomplishment of functional activities.    Time  4    Period  Weeks    Status  On-going      PT LONG TERM GOAL #4   Title  Decrease edema to within 2.5 cms of non-affected side to assist with pain reduction and range of motion gains.    Time  4    Period  Weeks    Status  On-going      PT  LONG TERM GOAL #5   Title  Perform a reciprocating stair gait with one railing with pain not > 2-3/10.    Time  4    Period  Weeks    Status  On-going            Plan - 05/23/18 1340    Clinical Impression Statement  Patient tolerated treatment well today. Patient able to progress left knee strengthening exercises today. Patient has some c/o tightness post knee ongoing esp at night. Patient continues to progress with ROM. Goals progressing today.     Rehab Potential  Excellent    PT Frequency  3x / week    PT Duration  4 weeks    PT Treatment/Interventions  ADLs/Self  Care Home Management;Cryotherapy;Electrical Stimulation;Therapeutic exercise;Therapeutic activities;Functional mobility training;Neuromuscular re-education;Patient/family education;Manual techniques;Passive range of motion;Vasopneumatic Device    PT Next Visit Plan  cont with POC and progress per TKA protocol     Consulted and Agree with Plan of Care  Patient       Patient will benefit from skilled therapeutic intervention in order to improve the following deficits and impairments:  Abnormal gait, Pain, Decreased activity tolerance, Increased edema, Decreased range of motion, Decreased strength, Decreased mobility  Visit Diagnosis: Chronic pain of left knee  Stiffness of left knee, not elsewhere classified  Localized edema  Muscle weakness (generalized)     Problem List Patient Active Problem List   Diagnosis Date Noted  . OA (osteoarthritis) of knee 05/07/2018  . Hyperparathyroidism, primary ([place]) 04/25/2017  . Falls 07/19/2016  . Precordial chest pain 12/23/2014  . Hypercalcemia 12/23/2014  . Diabetes ([place]) 10/23/2013  . HTN (hypertension) 10/23/2013    Tiburcio Linder P, PTA 05/23/2018, 2:04 PM  [facility] [place], [place], [address] Phone: [phone]   Fax:  [phone]  Name: Carolyn [name] MRN: [id number] Date of Birth: 1940-11-13

## 2018-05-25 ENCOUNTER — Encounter: Payer: Medicare Other | Admitting: Physical Therapy

## 2018-05-28 ENCOUNTER — Ambulatory Visit: Payer: Medicare Other | Admitting: Physical Therapy

## 2018-05-28 ENCOUNTER — Encounter: Payer: Self-pay | Admitting: Physical Therapy

## 2018-05-28 DIAGNOSIS — G8929 Other chronic pain: Secondary | ICD-10-CM | POA: Diagnosis not present

## 2018-05-28 DIAGNOSIS — M25662 Stiffness of left knee, not elsewhere classified: Secondary | ICD-10-CM | POA: Diagnosis not present

## 2018-05-28 DIAGNOSIS — M25562 Pain in left knee: Principal | ICD-10-CM

## 2018-05-28 DIAGNOSIS — R600 Localized edema: Secondary | ICD-10-CM | POA: Diagnosis not present

## 2018-05-28 DIAGNOSIS — M6281 Muscle weakness (generalized): Secondary | ICD-10-CM | POA: Diagnosis not present

## 2018-05-28 NOTE — Therapy (Signed)
[facility] [place], [place], [address] Phone: [phone]   Fax:  [phone]  Physical Therapy Treatment  Patient Details  Name: Carolyn [name] MRN: [id number] Date of Birth: 01-26-1941 Referring Provider (PT): Gaynelle Arabian MD.   Encounter Date: 05/28/2018  PT End of Session - 05/28/18 1123    Visit Number  7    Number of Visits  12    Date for PT Re-Evaluation  06/08/18    Authorization Type  FOTO AT LEAST EVERY 5TH VISIT, 10TH VISIT PROGRESS NOTE AND KX MODIFIER AFTER THE 15 VISIT.    PT Start Time  1116    PT Stop Time  1212    PT Time Calculation (min)  56 min    Equipment Utilized During Treatment  Other (comment)   FWW, SBQC   Activity Tolerance  Patient tolerated treatment well    Behavior During Therapy  WFL for tasks assessed/performed       Past Medical History:  Diagnosis Date  . Anxiety   . Arthritis   . Complication of anesthesia    " my heart rate gets low"  . Depression   . Diabetes mellitus ([place])    type 2  . GERD (gastroesophageal reflux disease)   . Hepatic steatosis   . Hyperlipidemia   . Hyperparathyroidism, primary ([place])   . Hypertension   . IBS (irritable bowel syndrome)   . Internal hemorrhoid   . Tubular adenoma of colon 1999   colonoscopy  . Wears dentures   . Wears glasses     Past Surgical History:  Procedure Laterality Date  . ANKLE FRACTURE SURGERY Left yrs ago  . BACK SURGERY  04/2011   lower fpr spinal stenosis and ruptured disc  . COLONOSCOPY W/ BIOPSIES AND POLYPECTOMY    . PARATHYROIDECTOMY Right 04/27/2017   Procedure: RIGHT PARATHYROIDECTOMY;  Surgeon: Armandina Gemma, MD;  Location: [facility];  Service: General;  Laterality: Right;  . TONSILLECTOMY  as child   and adenoids  . TOTAL KNEE ARTHROPLASTY Left 05/07/2018   Procedure: LEFT TOTAL KNEE ARTHROPLASTY;  Surgeon: Gaynelle Arabian, MD;  [facility];  Service: Orthopedics;  Laterality: Left;  50 mins  . VAGINAL  HYSTERECTOMY  1964   partial    There were no vitals filed for this visit.  Subjective Assessment - 05/28/18 1121    Subjective  Reports she went to MD and he said she was ahead of schedule but still sore.    Pertinent History  Left ankle fracture; IBS; DM.    Limitations  Walking    How long can you walk comfortably?  Short distances around home.    Patient Stated Goals  Get out of pain.    Currently in Pain?  Yes    Pain Score  4     Pain Location  Knee    Pain Orientation  Left    Pain Descriptors / Indicators  Sore    Pain Type  Surgical pain    Pain Onset  1 to 4 weeks ago         [facility] PT Assessment - 05/28/18 0001      ROM / Strength   AROM / PROM / Strength  AROM      AROM   Overall AROM   Deficits    AROM Assessment Site  Knee    Right/Left Knee  Left    Left Knee Extension  3    Left Knee Flexion  [facility] Adult PT Treatment/Exercise - 05/28/18 0001      Ambulation/Gait   Ambulation/Gait  Yes    Ambulation/Gait Assistance  5: Supervision    Ambulation/Gait Assistance Details  VCs and demo for 3 pt gait sequence, step sequencing    Ambulation Distance (Feet)  60 Feet    Assistive device  Small based quad cane    Gait Pattern  Step-to pattern;Decreased arm swing - left;Decreased step length - left;Decreased step length - right;Decreased stride length;Antalgic;Decreased trunk rotation;Narrow base of support    Ambulation Surface  Level;Indoor      Exercises   Exercises  Knee/Hip      Knee/Hip Exercises: Stretches   Passive Hamstring Stretch  Left;3 reps;30 seconds   via PTA   ITB Stretch  Left;3 reps;30 seconds   via PTA   Other Knee/Hip Stretches  Passive L hip adductor stretch 3x30 sec via PTA      Knee/Hip Exercises: Aerobic   Nustep  L4, seat 8 x20 min      Knee/Hip Exercises: Supine   Short Arc Quad Sets  AROM;Left;3 sets;10 reps    Straight Leg Raises  AROM;Left;20 reps      Modalities   Modalities   Vasopneumatic      Vasopneumatic   Number Minutes Vasopneumatic   10 minutes    Vasopnuematic Location   Knee    Vasopneumatic Pressure  Low    Vasopneumatic Temperature   34      Manual Therapy   Manual Therapy  Passive ROM    Passive ROM  PROM of L knee into flexion, ext with gentle holds at end range          Balance Exercises - 05/28/18 1124      Balance Exercises: Standing   Standing Eyes Closed  --    Tandem Stance  --    Heel Raises Limitations  --    Toe Raise Limitations  --          PT Short Term Goals - 05/21/18 1144      PT SHORT TERM GOAL #1   Title  Ind with an initial HEP.    Time  2    Period  Weeks    Status  On-going      PT SHORT TERM GOAL #2   Title  Full active left knee extension.    Time  2    Period  Weeks    Status  On-going        PT Long Term Goals - 05/21/18 1144      PT LONG TERM GOAL #1   Title  Ind with an advanced HEP.    Time  4    Period  Weeks    Status  On-going      PT LONG TERM GOAL #2   Title  Active left knee flexion to 115 degrees+ so the patient can perform functional tasks and do so with pain not > 2-3/10.    Time  4    Period  Weeks    Status  On-going      PT LONG TERM GOAL #3   Title  Increase left  knee strength to a solid 4+/5 to provide good stability for accomplishment of functional activities.    Time  4    Period  Weeks    Status  On-going  PT LONG TERM GOAL #4   Title  Decrease edema to within 2.5 cms of non-affected side to assist with pain reduction and range of motion gains.    Time  4    Period  Weeks    Status  On-going      PT LONG TERM GOAL #5   Title  Perform a reciprocating stair gait with one railing with pain not > 2-3/10.    Time  4    Period  Weeks    Status  On-going            Plan - 05/28/18 1213    Clinical Impression Statement  Patient overall tolerated today's treatment fairly well other than complaints of L knee tightness especially in posterior knee.  AROM of L knee assessed as 3-110 deg and with tightness of L HS, hip adductor as well as ITB. Passive stretching completed via PTA to reduce muscle tightness. Patient also instructed in gait training with [facility] and three point gait sequence. Patient initially very hesistant with SBQC and VC'd throughout the session for corrections. Normal vasopneumatic response noted following removal of the modalities.    Rehab Potential  Excellent    PT Frequency  3x / week    PT Duration  4 weeks    PT Treatment/Interventions  ADLs/Self Care Home Management;Cryotherapy;Electrical Stimulation;Therapeutic exercise;Therapeutic activities;Functional mobility training;Neuromuscular re-education;Patient/family education;Manual techniques;Passive range of motion;Vasopneumatic Device    PT Next Visit Plan  cont with POC and progress per TKA protocol     Consulted and Agree with Plan of Care  Patient       Patient will benefit from skilled therapeutic intervention in order to improve the following deficits and impairments:  Abnormal gait, Pain, Decreased activity tolerance, Increased edema, Decreased range of motion, Decreased strength, Decreased mobility  Visit Diagnosis: Chronic pain of left knee  Stiffness of left knee, not elsewhere classified  Localized edema  Muscle weakness (generalized)     Problem List Patient Active Problem List   Diagnosis Date Noted  . OA (osteoarthritis) of knee 05/07/2018  . Hyperparathyroidism, primary ([place]) 04/25/2017  . Falls 07/19/2016  . Precordial chest pain 12/23/2014  . Hypercalcemia 12/23/2014  . Diabetes ([place]) 10/23/2013  . HTN (hypertension) 10/23/2013    Standley Brooking, PTA 05/28/2018, 12:18 PM  [facility] [address] [place], [place], [address] Phone: [phone]   Fax:  [phone]  Name: DARLEAN WARMOTH MRN: [id number] Date of Birth: [birth year]

## 2018-05-30 ENCOUNTER — Encounter: Payer: Self-pay | Admitting: Physical Therapy

## 2018-05-30 ENCOUNTER — Ambulatory Visit: Payer: Medicare Other | Admitting: Physical Therapy

## 2018-05-30 DIAGNOSIS — R600 Localized edema: Secondary | ICD-10-CM | POA: Diagnosis not present

## 2018-05-30 DIAGNOSIS — M25662 Stiffness of left knee, not elsewhere classified: Secondary | ICD-10-CM | POA: Diagnosis not present

## 2018-05-30 DIAGNOSIS — G8929 Other chronic pain: Secondary | ICD-10-CM

## 2018-05-30 DIAGNOSIS — M6281 Muscle weakness (generalized): Secondary | ICD-10-CM

## 2018-05-30 DIAGNOSIS — M25562 Pain in left knee: Secondary | ICD-10-CM | POA: Diagnosis not present

## 2018-05-30 NOTE — Therapy (Signed)
[facility] [place], [place], [address] Phone: [phone]   Fax:  [phone]  Physical Therapy Treatment  Patient Details  Name: Carolyn [name] MRN: [id number] Date of Birth: [birth year] Referring Provider (PT): Gaynelle Arabian MD.   Encounter Date: 05/30/2018  PT End of Session - 05/30/18 1120    Visit Number  8    Number of Visits  12    Date for PT Re-Evaluation  06/08/18    Authorization Type  FOTO AT LEAST EVERY 5TH VISIT, 10TH VISIT PROGRESS NOTE AND KX MODIFIER AFTER THE 15 VISIT.    PT Start Time  1116    PT Stop Time  1206    PT Time Calculation (min)  50 min    Equipment Utilized During Treatment  Other (comment)   FWW   Activity Tolerance  Patient tolerated treatment well    Behavior During Therapy  WFL for tasks assessed/performed       Past Medical History:  Diagnosis Date  . Anxiety   . Arthritis   . Complication of anesthesia    " my heart rate gets low"  . Depression   . Diabetes mellitus ([place])    type 2  . GERD (gastroesophageal reflux disease)   . Hepatic steatosis   . Hyperlipidemia   . Hyperparathyroidism, primary ([place])   . Hypertension   . IBS (irritable bowel syndrome)   . Internal hemorrhoid   . Tubular adenoma of colon 1999   colonoscopy  . Wears dentures   . Wears glasses     Past Surgical History:  Procedure Laterality Date  . ANKLE FRACTURE SURGERY Left yrs ago  . BACK SURGERY  04/2011   lower fpr spinal stenosis and ruptured disc  . COLONOSCOPY W/ BIOPSIES AND POLYPECTOMY    . PARATHYROIDECTOMY Right 04/27/2017   Procedure: RIGHT PARATHYROIDECTOMY;  Surgeon: Armandina Gemma, MD;  Location: [facility];  Service: General;  Laterality: Right;  . TONSILLECTOMY  as child   and adenoids  . TOTAL KNEE ARTHROPLASTY Left 05/07/2018   Procedure: LEFT TOTAL KNEE ARTHROPLASTY;  Surgeon: Gaynelle Arabian, MD;  Location: [facility];  Service: Orthopedics;  Laterality: Left;  50 mins  . VAGINAL  HYSTERECTOMY  1964   partial    There were no vitals filed for this visit.  Subjective Assessment - 05/30/18 1113    Subjective  Worried regarding her L ankle today as she notes it is swollen and she has a screw in place.    Pertinent History  Left ankle fracture; IBS; DM.    Limitations  Walking    How long can you walk comfortably?  Short distances around home.    Patient Stated Goals  Get out of pain.    Currently in Pain?  Other (Comment)   No pain assessment provided        [facility] PT Assessment - 05/30/18 0001      Assessment   Medical Diagnosis  Left total knee replacement.    Referring Provider (PT)  Gaynelle Arabian MD.    Onset Date/Surgical Date  05/07/18      Restrictions   Weight Bearing Restrictions  No                   [facility] Adult PT Treatment/Exercise - 05/30/18 0001      Exercises   Exercises  Knee/Hip      Knee/Hip Exercises: Stretches   Passive Hamstring Stretch  Left;3 reps;30 seconds   via  PTA     Knee/Hip Exercises: Aerobic   Nustep  L5 x12 min      Knee/Hip Exercises: Standing   Forward Lunges  Left;15 reps    Hip Abduction  AROM;Left;2 sets;10 reps;Knee straight    Forward Step Up  Left;15 reps;Hand Hold: 2;Step Height: 6"      Knee/Hip Exercises: Seated   Long Arc Quad  Strengthening;Left;3 sets;10 reps;Weights    Long Arc Quad Weight  3 lbs.      Modalities   Modalities  Vasopneumatic      Vasopneumatic   Number Minutes Vasopneumatic   15 minutes    Vasopnuematic Location   Knee    Vasopneumatic Pressure  Low    Vasopneumatic Temperature   34      Manual Therapy   Manual Therapy  Passive ROM    Passive ROM  PROM of L knee into flexion with gentle holds at end range               PT Short Term Goals - 05/21/18 1144      PT SHORT TERM GOAL #1   Title  Ind with an initial HEP.    Time  2    Period  Weeks    Status  On-going      PT SHORT TERM GOAL #2   Title  Full active left knee extension.    Time  2     Period  Weeks    Status  On-going        PT Long Term Goals - 05/21/18 1144      PT LONG TERM GOAL #1   Title  Ind with an advanced HEP.    Time  4    Period  Weeks    Status  On-going      PT LONG TERM GOAL #2   Title  Active left knee flexion to 115 degrees+ so the patient can perform functional tasks and do so with pain not > 2-3/10.    Time  4    Period  Weeks    Status  On-going      PT LONG TERM GOAL #3   Title  Increase left  knee strength to a solid 4+/5 to provide good stability for accomplishment of functional activities.    Time  4    Period  Weeks    Status  On-going      PT LONG TERM GOAL #4   Title  Decrease edema to within 2.5 cms of non-affected side to assist with pain reduction and range of motion gains.    Time  4    Period  Weeks    Status  On-going      PT LONG TERM GOAL #5   Title  Perform a reciprocating stair gait with one railing with pain not > 2-3/10.    Time  4    Period  Weeks    Status  On-going            Plan - 05/30/18 1212    Clinical Impression Statement  Patient tolerated today's treatment well although patient worried regarding L ankle edema. Patient tolerated exercises fairly well with less pain reported. Good stair gait noted with forward step ups. AROM of L knee measured as 3-115 deg. Normal modalities response noted following removal of the modalities. Only minimal L ankle edema noted and patient educated to monitor the swelling and pain.    Rehab Potential  Excellent  PT Frequency  3x / week    PT Duration  4 weeks    PT Treatment/Interventions  ADLs/Self Care Home Management;Cryotherapy;Electrical Stimulation;Therapeutic exercise;Therapeutic activities;Functional mobility training;Neuromuscular re-education;Patient/family education;Manual techniques;Passive range of motion;Vasopneumatic Device    PT Next Visit Plan  cont with POC and progress per TKA protocol     Consulted and Agree with Plan of Care  Patient        Patient will benefit from skilled therapeutic intervention in order to improve the following deficits and impairments:  Abnormal gait, Pain, Decreased activity tolerance, Increased edema, Decreased range of motion, Decreased strength, Decreased mobility  Visit Diagnosis: Chronic pain of left knee  Stiffness of left knee, not elsewhere classified  Localized edema  Muscle weakness (generalized)     Problem List Patient Active Problem List   Diagnosis Date Noted  . OA (osteoarthritis) of knee 05/07/2018  . Hyperparathyroidism, primary ([place]) 04/25/2017  . Falls 07/19/2016  . Precordial chest pain 12/23/2014  . Hypercalcemia 12/23/2014  . Diabetes ([place]) 10/23/2013  . HTN (hypertension) 10/23/2013    Standley Brooking, PTA 05/30/2018, 12:18 PM  [facility] [address] [place], [place], [address] Phone: [phone]   Fax:  [phone]  Name: Carolyn [name] MRN: [id number] Date of Birth: October 27, 1940

## 2018-06-01 ENCOUNTER — Encounter: Payer: Medicare Other | Admitting: Physical Therapy

## 2018-06-04 ENCOUNTER — Encounter: Payer: Self-pay | Admitting: Physical Therapy

## 2018-06-04 ENCOUNTER — Ambulatory Visit: Payer: Medicare Other | Admitting: Physical Therapy

## 2018-06-04 DIAGNOSIS — M6281 Muscle weakness (generalized): Secondary | ICD-10-CM | POA: Diagnosis not present

## 2018-06-04 DIAGNOSIS — G8929 Other chronic pain: Secondary | ICD-10-CM | POA: Diagnosis not present

## 2018-06-04 DIAGNOSIS — R600 Localized edema: Secondary | ICD-10-CM

## 2018-06-04 DIAGNOSIS — M25562 Pain in left knee: Secondary | ICD-10-CM | POA: Diagnosis not present

## 2018-06-04 DIAGNOSIS — M25662 Stiffness of left knee, not elsewhere classified: Secondary | ICD-10-CM | POA: Diagnosis not present

## 2018-06-04 DIAGNOSIS — G4700 Insomnia, unspecified: Secondary | ICD-10-CM | POA: Diagnosis not present

## 2018-06-04 NOTE — Therapy (Signed)
[facility] [place], [place], [address] Phone: [phone]   Fax:  [phone]  Physical Therapy Treatment  Patient Details  Name: Carolyn [name] MRN: [id number] Date of Birth: 04-19-1941 Referring Provider (PT): Gaynelle Arabian MD.   Encounter Date: 06/04/2018  PT End of Session - 06/04/18 1102    Visit Number  9    Number of Visits  12    Date for PT Re-Evaluation  06/08/18    Authorization Type  FOTO AT LEAST EVERY 5TH VISIT, 10TH VISIT PROGRESS NOTE AND KX MODIFIER AFTER THE 15 VISIT.    PT Start Time  1117    PT Stop Time  1206    PT Time Calculation (min)  49 min    Equipment Utilized During Treatment  Other (comment)   FWW   Activity Tolerance  Patient tolerated treatment well    Behavior During Therapy  WFL for tasks assessed/performed       Past Medical History:  Diagnosis Date  . Anxiety   . Arthritis   . Complication of anesthesia    " my heart rate gets low"  . Depression   . Diabetes mellitus ([place])    type 2  . GERD (gastroesophageal reflux disease)   . Hepatic steatosis   . Hyperlipidemia   . Hyperparathyroidism, primary ([place])   . Hypertension   . IBS (irritable bowel syndrome)   . Internal hemorrhoid   . Tubular adenoma of colon 1999   colonoscopy  . Wears dentures   . Wears glasses     Past Surgical History:  Procedure Laterality Date  . ANKLE FRACTURE SURGERY Left yrs ago  . BACK SURGERY  04/2011   lower fpr spinal stenosis and ruptured disc  . COLONOSCOPY W/ BIOPSIES AND POLYPECTOMY    . PARATHYROIDECTOMY Right 04/27/2017   Procedure: RIGHT PARATHYROIDECTOMY;  Surgeon: Armandina Gemma, MD;  Location: [facility];  Service: General;  Laterality: Right;  . TONSILLECTOMY  as child   and adenoids  . TOTAL KNEE ARTHROPLASTY Left 05/07/2018   Procedure: LEFT TOTAL KNEE ARTHROPLASTY;  Surgeon: Gaynelle Arabian, MD;  [facility];  Service: Orthopedics;  Laterality: Left;  50 mins  . VAGINAL  HYSTERECTOMY  1964   partial    There were no vitals filed for this visit.  Subjective Assessment - 06/04/18 1100    Subjective  Reports some discomfort in lateral L knee today but forgot to take tylenol prior to PT. Reports that her L ankle is better.    Pertinent History  Left ankle fracture; IBS; DM.    Limitations  Walking    How long can you walk comfortably?  Short distances around home.    Patient Stated Goals  Get out of pain.    Currently in Pain?  Yes    Pain Score  5     Pain Location  Knee    Pain Orientation  Left    Pain Descriptors / Indicators  Discomfort    Pain Type  Surgical pain    Pain Onset  1 to 4 weeks ago         [facility] PT Assessment - 06/04/18 0001      Assessment   Medical Diagnosis  Left total knee replacement.    Referring Provider (PT)  Gaynelle Arabian MD.    Onset Date/Surgical Date  05/07/18    Next MD Visit  06/11/2018      Restrictions   Weight Bearing Restrictions  No  Observation/Other Assessments-Edema    Edema  Circumferential      Circumferential Edema   Circumferential - Right  42.8 cm    Circumferential - Left   43 cm      ROM / Strength   AROM / PROM / Strength  AROM;Strength      AROM   Overall AROM   Within functional limits for tasks performed    AROM Assessment Site  Knee    Right/Left Knee  Left    Left Knee Extension  3    Left Knee Flexion  115      Strength   Overall Strength  Within functional limits for tasks performed    Strength Assessment Site  Knee    Right/Left Knee  Left    Left Knee Flexion  4+/5    Left Knee Extension  4+/5                   [facility] Adult PT Treatment/Exercise - 06/04/18 0001      Ambulation/Gait   Stairs  Yes    Stairs Assistance  6: Modified independent (Device/Increase time)    Stair Management Technique  One rail Right;Alternating pattern;Step to pattern;Forwards    Number of Stairs  4   x2 RT   Height of Stairs  6.5      Exercises   Exercises  Knee/Hip       Knee/Hip Exercises: Aerobic   Stationary Bike  seat 7 x10 min      Knee/Hip Exercises: Standing   Forward Lunges  Left;20 reps    Terminal Knee Extension  Strengthening;Left;15 reps   green theraband   Forward Step Up  Left;15 reps;Hand Hold: 2;Step Height: 6"      Knee/Hip Exercises: Seated   Long Arc Quad  Strengthening;Left;3 sets;10 reps;Weights    Long Arc Quad Weight  4 lbs.      Modalities   Modalities  Vasopneumatic      Vasopneumatic   Number Minutes Vasopneumatic   15 minutes    Vasopnuematic Location   Knee    Vasopneumatic Pressure  Low    Vasopneumatic Temperature   34               PT Short Term Goals - 06/04/18 1147      PT SHORT TERM GOAL #1   Title  Ind with an initial HEP.    Time  2    Period  Weeks    Status  Unable to assess      PT SHORT TERM GOAL #2   Title  Full active left knee extension.    Time  2    Period  Weeks    Status  Not Met   3 deg from neutral 06/04/2018       PT Long Term Goals - 06/04/18 1143      PT LONG TERM GOAL #1   Title  Ind with an advanced HEP.    Time  4    Period  Weeks    Status  Unable to assess      PT LONG TERM GOAL #2   Title  Active left knee flexion to 115 degrees+ so the patient can perform functional tasks and do so with pain not > 2-3/10.    Time  4    Period  Weeks    Status  Achieved      PT LONG TERM GOAL #3   Title  Increase left  knee  strength to a solid 4+/5 to provide good stability for accomplishment of functional activities.    Time  4    Period  Weeks    Status  Achieved      PT LONG TERM GOAL #4   Title  Decrease edema to within 2.5 cms of non-affected side to assist with pain reduction and range of motion gains.    Time  4    Period  Weeks    Status  Achieved      PT LONG TERM GOAL #5   Title  Perform a reciprocating stair gait with one railing with pain not > 2-3/10.    Time  4    Period  Weeks    Status  Achieved            Plan - 06/04/18 1201     Clinical Impression Statement  Patient tolerated today's treatment well as she was able to be progressed to stationary bike. Patient interested in facility's self directed gym program which would be easier on her financially due to high copay. Patient progressed through exercises without difficulty or complaint of pain. Patient able to ascend/descend stairs reciprically when cued by PTA but able to do without pain. Functionally weak when ascending stairs leading with LLE. Patient able to achieve all goals except HEP and full L knee extension at this time. Patient still ambulating with FWW but educated in clinic in prior treatment of proper gait sequencing. Patient educated to elevator or ice if edema noted and to rest with pillow under L foot to assist with L knee extension. Normal vasopneumatic response noted following removal of the modality. FOTO taken today 54% with CK goal status.    Rehab Potential  Excellent    PT Frequency  3x / week    PT Duration  4 weeks    PT Treatment/Interventions  ADLs/Self Care Home Management;Cryotherapy;Electrical Stimulation;Therapeutic exercise;Therapeutic activities;Functional mobility training;Neuromuscular re-education;Patient/family education;Manual techniques;Passive range of motion;Vasopneumatic Device    PT Next Visit Plan  cont with POC and progress per TKA protocol     Consulted and Agree with Plan of Care  Patient       Patient will benefit from skilled therapeutic intervention in order to improve the following deficits and impairments:  Abnormal gait, Pain, Decreased activity tolerance, Increased edema, Decreased range of motion, Decreased strength, Decreased mobility  Visit Diagnosis: Chronic pain of left knee  Stiffness of left knee, not elsewhere classified  Localized edema  Muscle weakness (generalized)     Problem List Patient Active Problem List   Diagnosis Date Noted  . OA (osteoarthritis) of knee 05/07/2018  . Hyperparathyroidism,  primary ([place]) 04/25/2017  . Falls 07/19/2016  . Precordial chest pain 12/23/2014  . Hypercalcemia 12/23/2014  . Diabetes ([place]) 10/23/2013  . HTN (hypertension) 10/23/2013   Standley Brooking, PTA 06/04/18 12:14 PM   [facility] [place], [place], [address] Phone: [phone]   Fax:  [phone]  Name: Carolyn [name] MRN: [id number] Date of Birth: [birth year]  PHYSICAL THERAPY DISCHARGE SUMMARY  Visits from Start of Care: 9.  Current functional level related to goals / functional outcomes: See above.   Remaining deficits: All goals essentially met.   Education / Equipment: HEP. Plan: Patient agrees to discharge.  Patient goals were met. Patient is being discharged due to meeting the stated rehab goals.  ?????         Mali Applegate MPT

## 2018-06-06 ENCOUNTER — Encounter: Payer: Medicare Other | Admitting: Physical Therapy

## 2018-06-12 DIAGNOSIS — M1712 Unilateral primary osteoarthritis, left knee: Secondary | ICD-10-CM | POA: Diagnosis not present

## 2018-06-12 DIAGNOSIS — Z471 Aftercare following joint replacement surgery: Secondary | ICD-10-CM | POA: Diagnosis not present

## 2018-06-12 DIAGNOSIS — Z96652 Presence of left artificial knee joint: Secondary | ICD-10-CM | POA: Diagnosis not present

## 2018-06-18 DIAGNOSIS — G4700 Insomnia, unspecified: Secondary | ICD-10-CM | POA: Diagnosis not present

## 2018-06-18 DIAGNOSIS — M25572 Pain in left ankle and joints of left foot: Secondary | ICD-10-CM | POA: Diagnosis not present

## 2018-06-18 DIAGNOSIS — D519 Vitamin B12 deficiency anemia, unspecified: Secondary | ICD-10-CM | POA: Diagnosis not present

## 2018-07-09 DIAGNOSIS — M109 Gout, unspecified: Secondary | ICD-10-CM | POA: Diagnosis not present

## 2018-07-09 DIAGNOSIS — D519 Vitamin B12 deficiency anemia, unspecified: Secondary | ICD-10-CM | POA: Diagnosis not present

## 2018-07-09 DIAGNOSIS — M1711 Unilateral primary osteoarthritis, right knee: Secondary | ICD-10-CM | POA: Diagnosis not present

## 2018-07-20 DIAGNOSIS — I10 Essential (primary) hypertension: Secondary | ICD-10-CM | POA: Diagnosis not present

## 2018-07-20 DIAGNOSIS — E782 Mixed hyperlipidemia: Secondary | ICD-10-CM | POA: Diagnosis not present

## 2018-07-20 DIAGNOSIS — N183 Chronic kidney disease, stage 3 (moderate): Secondary | ICD-10-CM | POA: Diagnosis not present

## 2018-07-20 DIAGNOSIS — Z7689 Persons encountering health services in other specified circumstances: Secondary | ICD-10-CM | POA: Diagnosis not present

## 2018-07-20 DIAGNOSIS — E039 Hypothyroidism, unspecified: Secondary | ICD-10-CM | POA: Diagnosis not present

## 2018-07-20 DIAGNOSIS — E1122 Type 2 diabetes mellitus with diabetic chronic kidney disease: Secondary | ICD-10-CM | POA: Diagnosis not present

## 2018-07-20 DIAGNOSIS — E1165 Type 2 diabetes mellitus with hyperglycemia: Secondary | ICD-10-CM | POA: Diagnosis not present

## 2018-07-23 DIAGNOSIS — E782 Mixed hyperlipidemia: Secondary | ICD-10-CM | POA: Diagnosis not present

## 2018-07-23 DIAGNOSIS — K219 Gastro-esophageal reflux disease without esophagitis: Secondary | ICD-10-CM | POA: Diagnosis not present

## 2018-07-23 DIAGNOSIS — I10 Essential (primary) hypertension: Secondary | ICD-10-CM | POA: Diagnosis not present

## 2018-07-23 DIAGNOSIS — N183 Chronic kidney disease, stage 3 (moderate): Secondary | ICD-10-CM | POA: Diagnosis not present

## 2018-07-23 DIAGNOSIS — E1122 Type 2 diabetes mellitus with diabetic chronic kidney disease: Secondary | ICD-10-CM | POA: Diagnosis not present

## 2018-08-29 DIAGNOSIS — R040 Epistaxis: Secondary | ICD-10-CM | POA: Diagnosis not present

## 2018-09-20 DIAGNOSIS — M25512 Pain in left shoulder: Secondary | ICD-10-CM | POA: Diagnosis not present

## 2018-09-26 DIAGNOSIS — R300 Dysuria: Secondary | ICD-10-CM | POA: Diagnosis not present

## 2018-09-28 DIAGNOSIS — K5792 Diverticulitis of intestine, part unspecified, without perforation or abscess without bleeding: Secondary | ICD-10-CM | POA: Diagnosis not present

## 2018-09-28 DIAGNOSIS — R05 Cough: Secondary | ICD-10-CM | POA: Diagnosis not present

## 2018-10-01 DIAGNOSIS — J209 Acute bronchitis, unspecified: Secondary | ICD-10-CM | POA: Diagnosis not present

## 2018-10-18 DIAGNOSIS — Z96652 Presence of left artificial knee joint: Secondary | ICD-10-CM | POA: Diagnosis not present

## 2018-10-18 DIAGNOSIS — Z471 Aftercare following joint replacement surgery: Secondary | ICD-10-CM | POA: Diagnosis not present

## 2018-10-24 ENCOUNTER — Ambulatory Visit: Payer: Self-pay | Admitting: Family Medicine

## 2018-10-24 ENCOUNTER — Encounter

## 2018-11-05 DIAGNOSIS — I10 Essential (primary) hypertension: Secondary | ICD-10-CM | POA: Diagnosis not present

## 2018-11-05 DIAGNOSIS — E782 Mixed hyperlipidemia: Secondary | ICD-10-CM | POA: Diagnosis not present

## 2018-11-23 DIAGNOSIS — D519 Vitamin B12 deficiency anemia, unspecified: Secondary | ICD-10-CM | POA: Diagnosis not present

## 2018-11-23 DIAGNOSIS — K219 Gastro-esophageal reflux disease without esophagitis: Secondary | ICD-10-CM | POA: Diagnosis not present

## 2018-11-23 DIAGNOSIS — E1129 Type 2 diabetes mellitus with other diabetic kidney complication: Secondary | ICD-10-CM | POA: Diagnosis not present

## 2018-11-23 DIAGNOSIS — E1122 Type 2 diabetes mellitus with diabetic chronic kidney disease: Secondary | ICD-10-CM | POA: Diagnosis not present

## 2018-11-23 DIAGNOSIS — N183 Chronic kidney disease, stage 3 (moderate): Secondary | ICD-10-CM | POA: Diagnosis not present

## 2018-11-26 DIAGNOSIS — I10 Essential (primary) hypertension: Secondary | ICD-10-CM | POA: Diagnosis not present

## 2018-11-26 DIAGNOSIS — M1711 Unilateral primary osteoarthritis, right knee: Secondary | ICD-10-CM | POA: Diagnosis not present

## 2018-11-26 DIAGNOSIS — Z1212 Encounter for screening for malignant neoplasm of rectum: Secondary | ICD-10-CM | POA: Diagnosis not present

## 2018-11-26 DIAGNOSIS — E1122 Type 2 diabetes mellitus with diabetic chronic kidney disease: Secondary | ICD-10-CM | POA: Diagnosis not present

## 2018-11-26 DIAGNOSIS — Z0001 Encounter for general adult medical examination with abnormal findings: Secondary | ICD-10-CM | POA: Diagnosis not present

## 2018-11-26 DIAGNOSIS — M1712 Unilateral primary osteoarthritis, left knee: Secondary | ICD-10-CM | POA: Diagnosis not present

## 2018-12-21 ENCOUNTER — Other Ambulatory Visit: Payer: Self-pay

## 2018-12-21 ENCOUNTER — Ambulatory Visit: Payer: Medicare Other | Admitting: Gastroenterology

## 2018-12-21 VITALS — Ht 67.0 in | Wt 180.0 lb

## 2018-12-21 DIAGNOSIS — K589 Irritable bowel syndrome without diarrhea: Secondary | ICD-10-CM

## 2018-12-21 NOTE — Progress Notes (Signed)
Attempted multiple telephone calls for telemedicine visit, unable to reach. Please contact patient and reschedule for next available time.

## 2019-01-02 DIAGNOSIS — M19041 Primary osteoarthritis, right hand: Secondary | ICD-10-CM | POA: Diagnosis not present

## 2019-01-02 DIAGNOSIS — M1711 Unilateral primary osteoarthritis, right knee: Secondary | ICD-10-CM | POA: Diagnosis not present

## 2019-01-02 DIAGNOSIS — L932 Other local lupus erythematosus: Secondary | ICD-10-CM | POA: Diagnosis not present

## 2019-01-05 DIAGNOSIS — E1122 Type 2 diabetes mellitus with diabetic chronic kidney disease: Secondary | ICD-10-CM | POA: Diagnosis not present

## 2019-01-05 DIAGNOSIS — I10 Essential (primary) hypertension: Secondary | ICD-10-CM | POA: Diagnosis not present

## 2019-01-11 DIAGNOSIS — M1990 Unspecified osteoarthritis, unspecified site: Secondary | ICD-10-CM | POA: Diagnosis not present

## 2019-01-11 DIAGNOSIS — M791 Myalgia, unspecified site: Secondary | ICD-10-CM | POA: Diagnosis not present

## 2019-01-11 DIAGNOSIS — M19041 Primary osteoarthritis, right hand: Secondary | ICD-10-CM | POA: Diagnosis not present

## 2019-01-14 DIAGNOSIS — M1711 Unilateral primary osteoarthritis, right knee: Secondary | ICD-10-CM | POA: Diagnosis not present

## 2019-01-21 DIAGNOSIS — M1711 Unilateral primary osteoarthritis, right knee: Secondary | ICD-10-CM | POA: Diagnosis not present

## 2019-01-24 DIAGNOSIS — M150 Primary generalized (osteo)arthritis: Secondary | ICD-10-CM | POA: Diagnosis not present

## 2019-01-24 DIAGNOSIS — M2550 Pain in unspecified joint: Secondary | ICD-10-CM | POA: Diagnosis not present

## 2019-01-28 ENCOUNTER — Other Ambulatory Visit: Payer: Self-pay | Admitting: Family Medicine

## 2019-01-28 DIAGNOSIS — Z1231 Encounter for screening mammogram for malignant neoplasm of breast: Secondary | ICD-10-CM

## 2019-01-28 DIAGNOSIS — M1711 Unilateral primary osteoarthritis, right knee: Secondary | ICD-10-CM | POA: Diagnosis not present

## 2019-02-01 ENCOUNTER — Other Ambulatory Visit: Payer: Self-pay

## 2019-02-01 NOTE — Patient Outreach (Signed)
[facility]) Care Management  02/01/2019  Carolyn [name] 1941-04-04 [id number]   Medication Adherence call to Mrs. Carolyn [name] Hippa Identifiers Verify spoke with patient she is past due on Metformin Er 500 mg patient explain she is taking 1 tablet daily and has plenty at this time patient will order when due.Carolyn [name] is showing past due under [place].  [place] Management Direct Dial [phone]  Fax [phone] [email or web address]

## 2019-02-05 DIAGNOSIS — E782 Mixed hyperlipidemia: Secondary | ICD-10-CM | POA: Diagnosis not present

## 2019-02-05 DIAGNOSIS — I10 Essential (primary) hypertension: Secondary | ICD-10-CM | POA: Diagnosis not present

## 2019-02-12 DIAGNOSIS — M2550 Pain in unspecified joint: Secondary | ICD-10-CM | POA: Diagnosis not present

## 2019-02-12 DIAGNOSIS — M0609 Rheumatoid arthritis without rheumatoid factor, multiple sites: Secondary | ICD-10-CM | POA: Diagnosis not present

## 2019-02-12 DIAGNOSIS — M150 Primary generalized (osteo)arthritis: Secondary | ICD-10-CM | POA: Diagnosis not present

## 2019-03-08 DIAGNOSIS — I10 Essential (primary) hypertension: Secondary | ICD-10-CM | POA: Diagnosis not present

## 2019-03-08 DIAGNOSIS — E785 Hyperlipidemia, unspecified: Secondary | ICD-10-CM | POA: Diagnosis not present

## 2019-03-11 ENCOUNTER — Other Ambulatory Visit: Payer: Self-pay

## 2019-03-11 ENCOUNTER — Ambulatory Visit
Admission: RE | Admit: 2019-03-11 | Discharge: 2019-03-11 | Disposition: A | Discharge status: Home / Self Care | Payer: Medicare Other | Source: Ambulatory Visit | Attending: Family Medicine | Admitting: Family Medicine

## 2019-03-11 DIAGNOSIS — Z1231 Encounter for screening mammogram for malignant neoplasm of breast: Secondary | ICD-10-CM | POA: Diagnosis not present

## 2019-03-13 ENCOUNTER — Other Ambulatory Visit: Payer: Self-pay | Admitting: Family Medicine

## 2019-03-13 DIAGNOSIS — R928 Other abnormal and inconclusive findings on diagnostic imaging of breast: Secondary | ICD-10-CM

## 2019-03-15 ENCOUNTER — Other Ambulatory Visit: Payer: Self-pay

## 2019-03-15 ENCOUNTER — Ambulatory Visit: Payer: Medicare Other

## 2019-03-15 ENCOUNTER — Ambulatory Visit
Admission: RE | Admit: 2019-03-15 | Discharge: 2019-03-15 | Disposition: A | Discharge status: Home / Self Care | Payer: Medicare Other | Source: Ambulatory Visit | Attending: Family Medicine | Admitting: Family Medicine

## 2019-03-15 DIAGNOSIS — R928 Other abnormal and inconclusive findings on diagnostic imaging of breast: Secondary | ICD-10-CM | POA: Diagnosis not present

## 2019-03-26 DIAGNOSIS — M150 Primary generalized (osteo)arthritis: Secondary | ICD-10-CM | POA: Diagnosis not present

## 2019-03-26 DIAGNOSIS — M0609 Rheumatoid arthritis without rheumatoid factor, multiple sites: Secondary | ICD-10-CM | POA: Diagnosis not present

## 2019-03-26 DIAGNOSIS — M2550 Pain in unspecified joint: Secondary | ICD-10-CM | POA: Diagnosis not present

## 2019-04-01 DIAGNOSIS — E782 Mixed hyperlipidemia: Secondary | ICD-10-CM | POA: Diagnosis not present

## 2019-04-01 DIAGNOSIS — E1165 Type 2 diabetes mellitus with hyperglycemia: Secondary | ICD-10-CM | POA: Diagnosis not present

## 2019-04-01 DIAGNOSIS — R42 Dizziness and giddiness: Secondary | ICD-10-CM | POA: Diagnosis not present

## 2019-04-01 DIAGNOSIS — I10 Essential (primary) hypertension: Secondary | ICD-10-CM | POA: Diagnosis not present

## 2019-04-01 DIAGNOSIS — K219 Gastro-esophageal reflux disease without esophagitis: Secondary | ICD-10-CM | POA: Diagnosis not present

## 2019-04-04 DIAGNOSIS — I10 Essential (primary) hypertension: Secondary | ICD-10-CM | POA: Diagnosis not present

## 2019-04-04 DIAGNOSIS — E1122 Type 2 diabetes mellitus with diabetic chronic kidney disease: Secondary | ICD-10-CM | POA: Diagnosis not present

## 2019-04-04 DIAGNOSIS — K219 Gastro-esophageal reflux disease without esophagitis: Secondary | ICD-10-CM | POA: Diagnosis not present

## 2019-04-04 DIAGNOSIS — E782 Mixed hyperlipidemia: Secondary | ICD-10-CM | POA: Diagnosis not present

## 2019-04-04 DIAGNOSIS — N183 Chronic kidney disease, stage 3 (moderate): Secondary | ICD-10-CM | POA: Diagnosis not present

## 2019-04-08 ENCOUNTER — Encounter: Payer: Self-pay | Admitting: *Deleted

## 2019-04-26 DIAGNOSIS — H3552 Pigmentary retinal dystrophy: Secondary | ICD-10-CM | POA: Diagnosis not present

## 2019-04-26 DIAGNOSIS — H35033 Hypertensive retinopathy, bilateral: Secondary | ICD-10-CM | POA: Diagnosis not present

## 2019-04-26 DIAGNOSIS — H35372 Puckering of macula, left eye: Secondary | ICD-10-CM | POA: Diagnosis not present

## 2019-04-26 DIAGNOSIS — H2513 Age-related nuclear cataract, bilateral: Secondary | ICD-10-CM | POA: Diagnosis not present

## 2019-04-30 DIAGNOSIS — M2550 Pain in unspecified joint: Secondary | ICD-10-CM | POA: Diagnosis not present

## 2019-04-30 DIAGNOSIS — M0609 Rheumatoid arthritis without rheumatoid factor, multiple sites: Secondary | ICD-10-CM | POA: Diagnosis not present

## 2019-04-30 DIAGNOSIS — M150 Primary generalized (osteo)arthritis: Secondary | ICD-10-CM | POA: Diagnosis not present

## 2019-05-06 DIAGNOSIS — M436 Torticollis: Secondary | ICD-10-CM | POA: Diagnosis not present

## 2019-05-16 ENCOUNTER — Other Ambulatory Visit: Payer: Self-pay

## 2019-05-16 NOTE — Patient Outreach (Signed)
[facility]) Care Management  05/16/2019  Carolyn [name] [birth year] [id number]   Medication Adherence call to Carolyn [name] Hippa Identifiers Verify spoke with patient she is past due on Metformin Er 500 mg patient explain she is only taking 2 tablets in the morning and 1 tablet in the evening instead of 4 tablets daily( 2 tablets qam & 2 qpm) left a message at doctors office patient has medication at this time.. Carolyn [name] is showing past due under [place].   [place] Management Direct Dial [phone]  Fax [phone] [email or web address]

## 2019-06-05 DIAGNOSIS — Z23 Encounter for immunization: Secondary | ICD-10-CM | POA: Diagnosis not present

## 2019-06-12 DIAGNOSIS — M2550 Pain in unspecified joint: Secondary | ICD-10-CM | POA: Diagnosis not present

## 2019-06-12 DIAGNOSIS — M0609 Rheumatoid arthritis without rheumatoid factor, multiple sites: Secondary | ICD-10-CM | POA: Diagnosis not present

## 2019-06-12 DIAGNOSIS — Z79899 Other long term (current) drug therapy: Secondary | ICD-10-CM | POA: Diagnosis not present

## 2019-06-12 DIAGNOSIS — M150 Primary generalized (osteo)arthritis: Secondary | ICD-10-CM | POA: Diagnosis not present

## 2019-06-24 DIAGNOSIS — J0101 Acute recurrent maxillary sinusitis: Secondary | ICD-10-CM | POA: Diagnosis not present

## 2019-07-16 ENCOUNTER — Other Ambulatory Visit: Payer: Self-pay

## 2019-07-16 NOTE — Patient Outreach (Signed)
[facility]) Care Management  07/16/2019  Carolyn [name] 10-13-1940 [id number]   Medication Adherence call to Carolyn [name] Hippa Identifiers Verify spoke with patient she is past due on Rosuvastatin 5 mg.patient explain she has stop taking this medication because she was having side effects ,patient explain she has an up coming appointment and will ask doctor to take it out of her list of medications.Carolyn [name] is showing past due under [place].   [facility] Management Direct Dial [phone]  Fax [phone] [email or web address]

## 2019-07-23 DIAGNOSIS — R42 Dizziness and giddiness: Secondary | ICD-10-CM | POA: Diagnosis not present

## 2019-07-23 DIAGNOSIS — E1122 Type 2 diabetes mellitus with diabetic chronic kidney disease: Secondary | ICD-10-CM | POA: Diagnosis not present

## 2019-07-23 DIAGNOSIS — R05 Cough: Secondary | ICD-10-CM | POA: Diagnosis not present

## 2019-07-23 DIAGNOSIS — K219 Gastro-esophageal reflux disease without esophagitis: Secondary | ICD-10-CM | POA: Diagnosis not present

## 2019-07-23 DIAGNOSIS — J45901 Unspecified asthma with (acute) exacerbation: Secondary | ICD-10-CM | POA: Diagnosis not present

## 2019-07-23 DIAGNOSIS — I10 Essential (primary) hypertension: Secondary | ICD-10-CM | POA: Diagnosis not present

## 2019-07-23 DIAGNOSIS — E782 Mixed hyperlipidemia: Secondary | ICD-10-CM | POA: Diagnosis not present

## 2019-07-26 DIAGNOSIS — E782 Mixed hyperlipidemia: Secondary | ICD-10-CM | POA: Diagnosis not present

## 2019-07-26 DIAGNOSIS — R2689 Other abnormalities of gait and mobility: Secondary | ICD-10-CM | POA: Diagnosis not present

## 2019-07-26 DIAGNOSIS — E1122 Type 2 diabetes mellitus with diabetic chronic kidney disease: Secondary | ICD-10-CM | POA: Diagnosis not present

## 2019-07-26 DIAGNOSIS — I10 Essential (primary) hypertension: Secondary | ICD-10-CM | POA: Diagnosis not present

## 2019-07-26 DIAGNOSIS — K219 Gastro-esophageal reflux disease without esophagitis: Secondary | ICD-10-CM | POA: Diagnosis not present

## 2019-08-12 DIAGNOSIS — K5792 Diverticulitis of intestine, part unspecified, without perforation or abscess without bleeding: Secondary | ICD-10-CM | POA: Diagnosis not present

## 2019-08-12 DIAGNOSIS — K602 Anal fissure, unspecified: Secondary | ICD-10-CM | POA: Diagnosis not present

## 2019-08-14 ENCOUNTER — Encounter: Payer: Self-pay | Admitting: Gastroenterology

## 2019-08-14 IMAGING — MG DIGITAL SCREENING BILATERAL MAMMOGRAM WITH TOMO AND CAD
8 series · 8 of 24 positions shown · non-contrast
Comparison: Previous exam(s).

CLINICAL DATA: Screening.

EXAM:
DIGITAL SCREENING BILATERAL MAMMOGRAM WITH TOMO AND CAD

[L CC synth-2D]
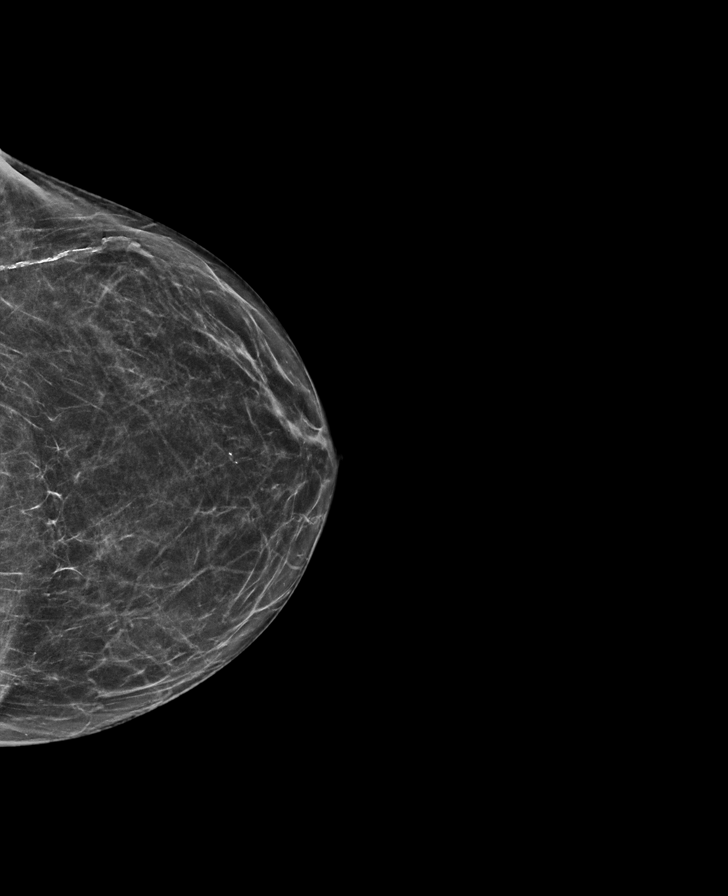

[R MLO synth-2D]
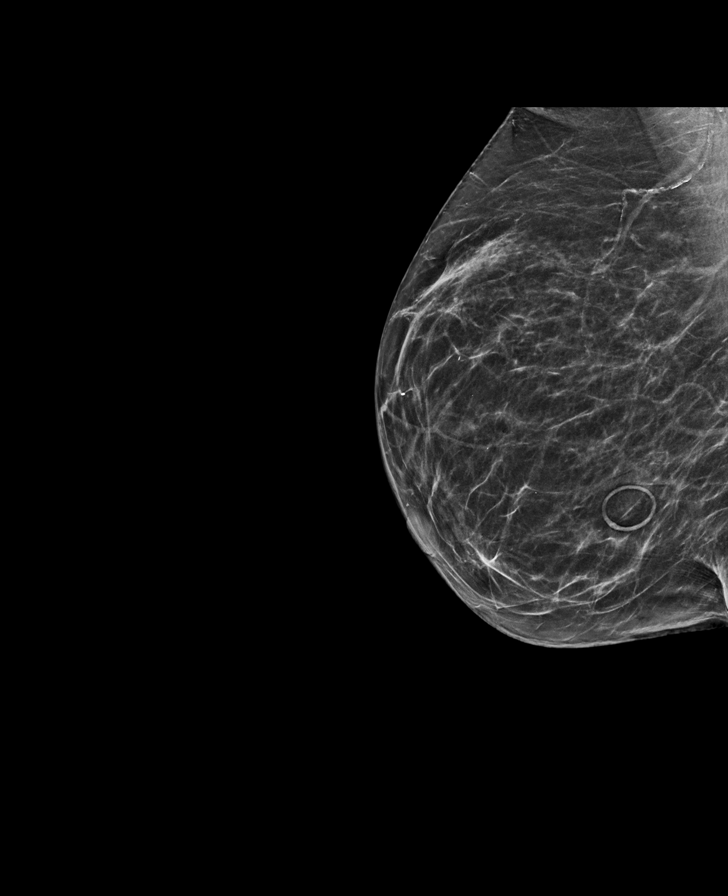

[L MLO synth-2D]
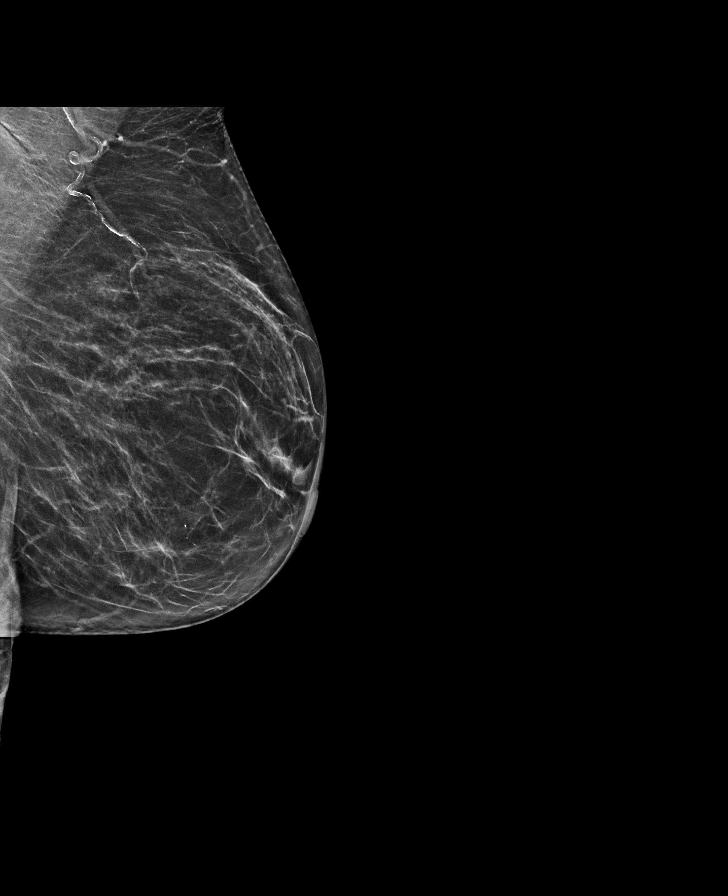

[R CC synth-2D]
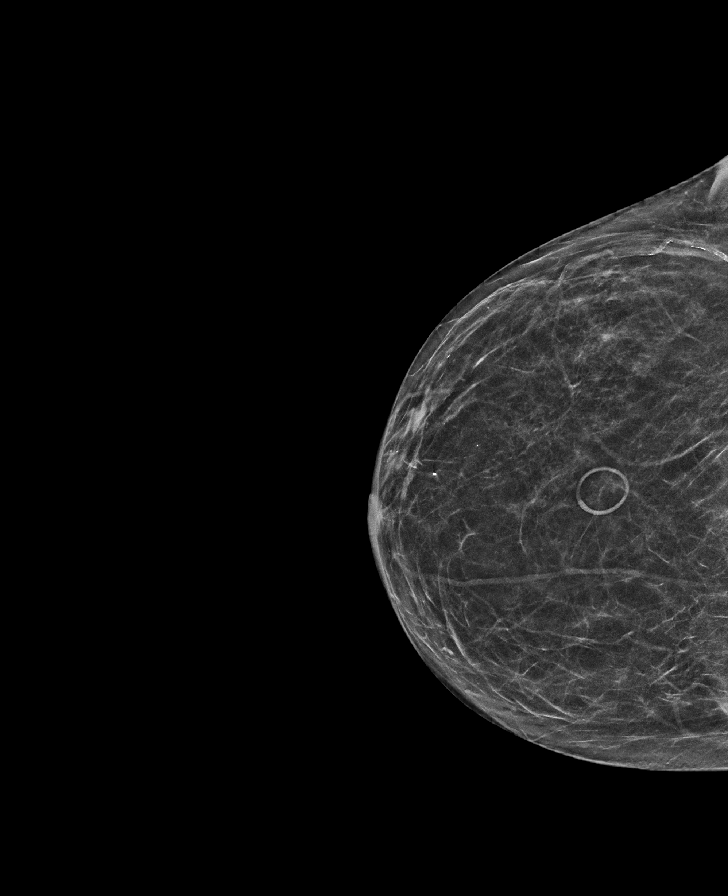

[L MLO tomo · tomo slice 31/62.0]
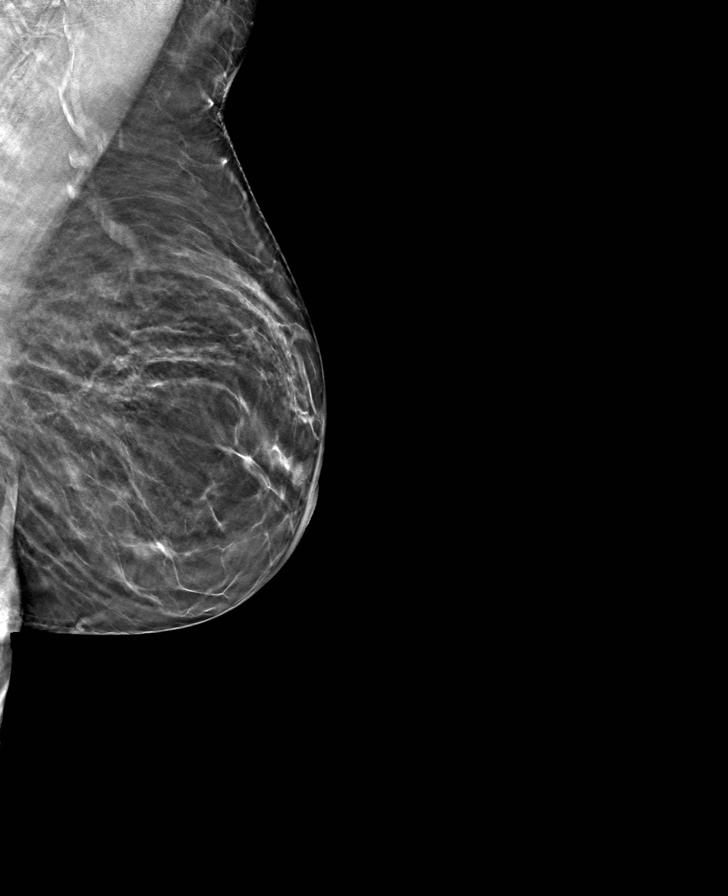

[R CC tomo · tomo slice 29/57.0]
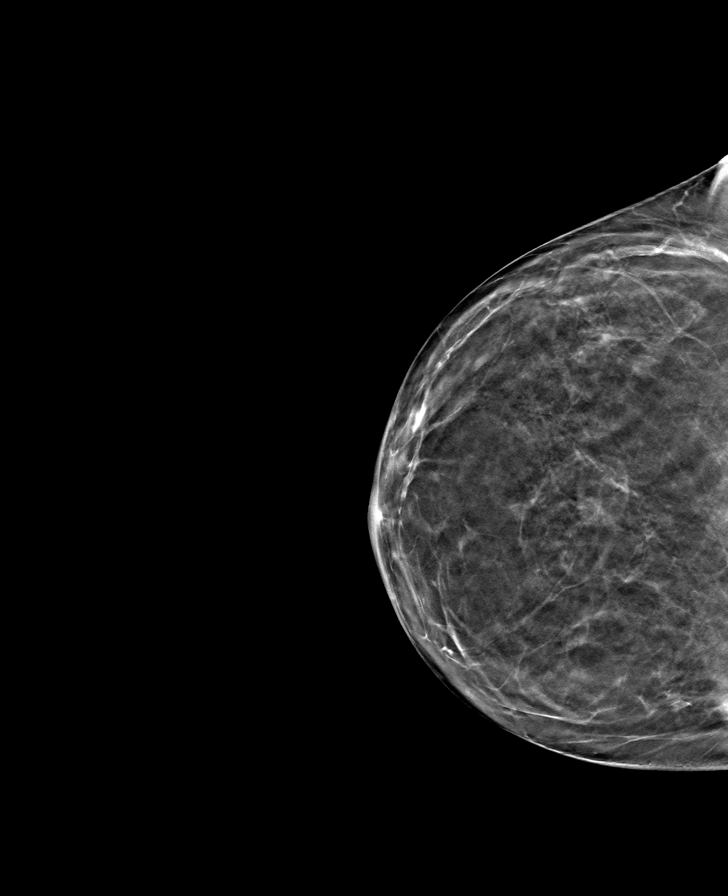

[R MLO tomo · tomo slice 31/60.0]
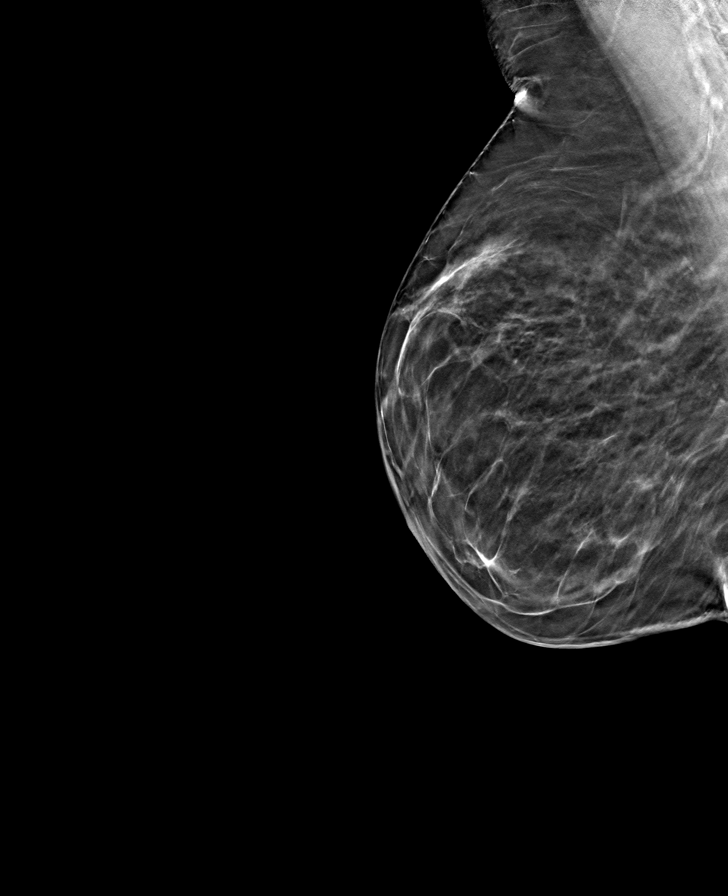

[L CC tomo · tomo slice 31/62.0]
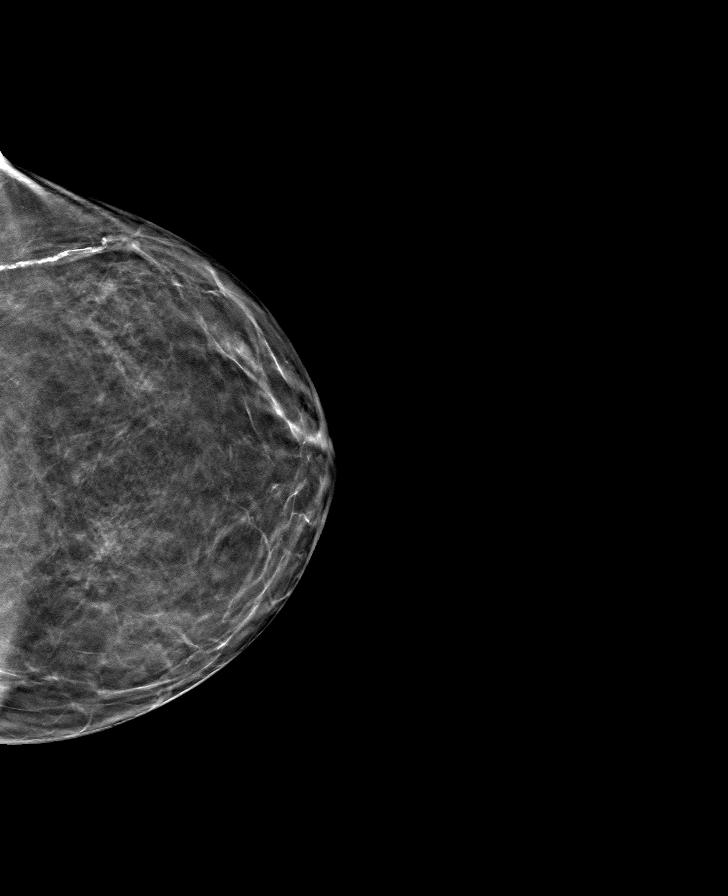

[8 of 24 positions shown; findings below may reference images not displayed]

ACR Breast Density Category b: There are scattered areas of
fibroglandular density.
FINDINGS: In the right breast, a possible mass warrants further evaluation.
This possible low-density mass is seen within the upper RIGHT
breast, at posterior depth, MLO view only, slice 35.

In the left breast, no findings suspicious for malignancy. Images
were processed with CAD.
IMPRESSION: Further evaluation is suggested for possible mass in the right
breast.

RECOMMENDATION:
Diagnostic mammogram and possibly ultrasound of the right breast.
(Code:43-9-TT4)

The patient will be contacted regarding the findings, and additional
imaging will be scheduled.

BI-RADS CATEGORY  0: Incomplete. Need additional imaging evaluation
and/or prior mammograms for comparison.

## 2019-08-20 DIAGNOSIS — M0609 Rheumatoid arthritis without rheumatoid factor, multiple sites: Secondary | ICD-10-CM | POA: Diagnosis not present

## 2019-08-20 DIAGNOSIS — M150 Primary generalized (osteo)arthritis: Secondary | ICD-10-CM | POA: Diagnosis not present

## 2019-08-20 DIAGNOSIS — M2550 Pain in unspecified joint: Secondary | ICD-10-CM | POA: Diagnosis not present

## 2019-08-22 ENCOUNTER — Other Ambulatory Visit (INDEPENDENT_AMBULATORY_CARE_PROVIDER_SITE_OTHER): Payer: Medicare Other

## 2019-08-22 ENCOUNTER — Encounter: Payer: Self-pay | Admitting: Gastroenterology

## 2019-08-22 ENCOUNTER — Ambulatory Visit: Payer: Medicare Other | Admitting: Gastroenterology

## 2019-08-22 VITALS — BP 130/80 | Temp 97.4°F | Ht 67.0 in | Wt 167.0 lb

## 2019-08-22 DIAGNOSIS — R140 Abdominal distension (gaseous): Secondary | ICD-10-CM

## 2019-08-22 DIAGNOSIS — R1030 Lower abdominal pain, unspecified: Secondary | ICD-10-CM | POA: Diagnosis not present

## 2019-08-22 LAB — BASIC METABOLIC PANEL
BUN: 14 mg/dL (ref 6–23)
CO2: 30 mEq/L (ref 19–32)
Calcium: 10.3 mg/dL (ref 8.4–10.5)
Chloride: 93 mEq/L — ABNORMAL LOW (ref 96–112)
Creatinine, Ser: 0.89 mg/dL (ref 0.40–1.20)
GFR: 61.12 mL/min (ref >60.00)
Glucose, Bld: 126 mg/dL — ABNORMAL HIGH (ref 70–99)
Potassium: 4.5 mEq/L (ref 3.5–5.1)
Sodium: 131 mEq/L — ABNORMAL LOW (ref 135–145)

## 2019-08-22 LAB — URINALYSIS, ROUTINE W REFLEX MICROSCOPIC
Bilirubin Urine: NEGATIVE
Hgb urine dipstick: NEGATIVE
Ketones, ur: NEGATIVE
Leukocytes,Ua: NEGATIVE
Nitrite: NEGATIVE
RBC / HPF: NONE SEEN (ref 0–?)
Specific Gravity, Urine: 1.015 (ref 1.000–1.030)
Total Protein, Urine: NEGATIVE
Urine Glucose: NEGATIVE
Urobilinogen, UA: 0.2 (ref 0.0–1.0)
pH: 7.5 (ref 5.0–8.0)

## 2019-08-22 NOTE — Patient Instructions (Signed)
If you are age 80 or older, your body mass index should be between 23-30. Your Body mass index is 26.16 kg/m. If this is out of the aforementioned range listed, please consider follow up with your Primary Care Provider.  If you are age 53 or younger, your body mass index should be between 19-25. Your Body mass index is 26.16 kg/m. If this is out of the aformentioned range listed, please consider follow up with your Primary Care Provider.   Your provider has requested that you go to the basement level for lab work before leaving today. Press "B" on the elevator. The lab is located at the first door on the left as you exit the elevator.  Start Benefiber 2 tablespoons in 8 oz of liquid daily.   You have been scheduled for a CT scan of the abdomen and pelvis at [facility] (1st floor radiology)  You are scheduled on Wednesday 08/28/19 at 10:30 am. You should arrive 15 minutes prior to your appointment time for registration. Please follow the written instructions below on the day of your exam:  WARNING: IF YOU ARE ALLERGIC TO IODINE/X-RAY DYE, PLEASE NOTIFY RADIOLOGY IMMEDIATELY AT [phone]! YOU WILL BE GIVEN A 13 HOUR PREMEDICATION PREP.  1) Do not eat or drink anything after 6:30 am (4 hours prior to your test) 2) You have been given 2 bottles of oral contrast to drink. The solution may taste better if refrigerated, but do NOT add ice or any other liquid to this solution. Shake well before drinking.    Drink 1 bottle of contrast @ 8:30 am (2 hours prior to your exam)  Drink 1 bottle of contrast @ 9:30 am (1 hour prior to your exam)  You may take any medications as prescribed with a small amount of water, if necessary. If you take any of the following medications: METFORMIN, GLUCOPHAGE, GLUCOVANCE, AVANDAMET, RIOMET, FORTAMET, [place] MET, JANUMET, GLUMETZA or METAGLIP, you MAY be asked to HOLD this medication 48 hours AFTER the exam.  The purpose of you drinking the oral contrast is to aid  in the visualization of your intestinal tract. The contrast solution may cause some diarrhea. Depending on your individual set of symptoms, you may also receive an intravenous injection of x-ray contrast/dye. Plan on being at [facility] for 30 minutes or longer, depending on the type of exam you are having performed.  This test typically takes 30-45 minutes to complete.   _________________________________________________________________

## 2019-08-22 NOTE — Progress Notes (Signed)
08/22/2019 Carolyn [name] [id number] Sep 24, 1940   HISTORY OF PRESENT ILLNESS: This is a pleasant 79 year old female who was previously patient of Dr. Nichola Sizer, but now will follow with Dr. Silverio Decamp.  She has history of irritable bowel syndrome.  She presents here today with reports of lower abdominal pain/discomfort that she describes as pressure and generalized abdominal bloating.  She says that it is always just very uncomfortable down there for the past 3 to 4 weeks.  She says that she does not really have any issues with constipation or diarrhea, but often times shortly after she eats she has to move her bowels, which has some urgency.  She sees occasional bright red blood on the toilet paper, which she attributes to hemorrhoids.  She actually saw her PCP recently due to hemorrhoidal irritation and was treated with Analpram cream.  Hemorrhoid irritation has resolved.  Denies any other urinary symptoms.  Upon reviewing her chart it looks like she was actually seen by Nicoletta Ba, one of our other PAs, in March 2015 with very similar symptoms as to what she complains of today.  Her last colonoscopy was in June 2013 at which time the study was normal except for internal hemorrhoids.  She tells me that she just had labs performed by her PCP recently and was told everything looked good.    Past Medical History:  Diagnosis Date  . Anxiety   . Arthritis   . CKD (chronic kidney disease)   . Complication of anesthesia    " my heart rate gets low"  . Depression   . Depression   . Diabetes mellitus ([place])    type 2  . GERD (gastroesophageal reflux disease)   . Hepatic steatosis   . Hyperlipidemia   . Hyperparathyroidism, primary ([place])   . Hypertension   . IBS (irritable bowel syndrome)   . Internal hemorrhoid   . OA (osteoarthritis)   . Tubular adenoma of colon 1999   colonoscopy  . Wears dentures   . Wears glasses    Past Surgical History:  Procedure Laterality Date  . ANKLE  FRACTURE SURGERY Left yrs ago  . BACK SURGERY  04/2011   lower fpr spinal stenosis and ruptured disc  . COLONOSCOPY W/ BIOPSIES AND POLYPECTOMY    . PARATHYROIDECTOMY Right 04/27/2017   Procedure: RIGHT PARATHYROIDECTOMY;  Surgeon: Armandina Gemma, MD;  Location: [facility];  Service: General;  Laterality: Right;  . TONSILLECTOMY  as child   and adenoids  . TOTAL KNEE ARTHROPLASTY Left 05/07/2018   Procedure: LEFT TOTAL KNEE ARTHROPLASTY;  Surgeon: Gaynelle Arabian, MD;  Location: [facility];  Service: Orthopedics;  Laterality: Left;  50 mins  . VAGINAL HYSTERECTOMY  1964   partial    reports that she has never smoked. She has never used smokeless tobacco. She reports that she does not drink alcohol or use drugs. family history includes Colon polyps in her sister; Diabetes in her sister; High blood pressure in her sister; Stomach cancer in her sister. Allergies  Allergen Reactions  . Sulfa Antibiotics Rash      Outpatient Encounter Medications as of 08/22/2019  Medication Sig  . citalopram (CELEXA) 20 MG tablet Take 20 mg by mouth daily with breakfast.   . hydrocortisone (ANUSOL-HC) 2.5 % rectal cream Place 1 application rectally as needed for hemorrhoids or anal itching.  . Liniments (SALONPAS PAIN RELIEF PATCH EX) Apply 1 patch topically daily as needed (pain).  Marland Kitchen losartan-hydrochlorothiazide (HYZAAR) 100-12.5 MG per tablet Take  1 tablet by mouth daily with breakfast.   . metFORMIN (GLUCOPHAGE-XR) 500 MG 24 hr tablet Take 500-1,000 mg by mouth See admin instructions. Take 2 tablets in the morning, and 1 tablet in the evening.  . methotrexate (RHEUMATREX) 2.5 MG tablet Take 2.5 mg by mouth once a week. Caution:Chemotherapy. Protect from light.  Take 9 pills once a week  . omeprazole (PRILOSEC) 20 MG capsule Take 20 mg by mouth daily before breakfast.   . traZODone (DESYREL) 50 MG tablet Take 50 mg by mouth at bedtime.  . [DISCONTINUED] triamcinolone cream (KENALOG) 0.1 % Apply 1 application topically 3  (three) times daily.   No facility-administered encounter medications on file as of 08/22/2019.     REVIEW OF SYSTEMS  : All other systems reviewed and negative except where noted in the History of Present Illness.   PHYSICAL EXAM: BP 130/80   Temp (!) 97.4 F (36.3 C)   Ht 5\' 7"  (1.702 m)   Wt 167 lb (75.8 kg)   BMI 26.16 kg/m  General: Well developed white female in no acute distress Head: Normocephalic and atraumatic Eyes:  Sclerae anicteric, conjunctiva pink. Ears: Normal auditory acuity Lungs: Clear throughout to auscultation; no increased WOB. Heart: Regular rate and rhythm; no M/R/G. Abdomen: Soft, non-distended.  BS present.  Lower abdominal TTP. Musculoskeletal: Symmetrical with no gross deformities  Skin: No lesions on visible extremities Extremities: No edema  Neurological: Alert oriented x 4, grossly non-focal Psychological:  Alert and cooperative. Normal mood and affect  ASSESSMENT AND PLAN: *Lower abdominal/pelvic pressure and discomfort and generalized abdominal bloating: We will check CT scan of the abdomen pelvis with contrast.  We will check a urinalysis as well although she denies any other urinary symptoms.  She reports that recent labs at PCPs office look good. *Change in bowel habits: No constipation or diarrhea, but often has the urge to move her bowels shortly after eating and then has some urgency to that.  Has history of IBS.  Recommended trying Benefiber 2 tablespoons mixed in 8 ounces of liquid daily to help allow some bulk to her stools.   CC:  Caryl Bis, MD

## 2019-08-27 NOTE — Progress Notes (Signed)
Reviewed and agree with documentation and assessment and plan. K. Veena Keigan Tafoya , MD   

## 2019-08-28 ENCOUNTER — Ambulatory Visit (HOSPITAL_COMMUNITY)
Admission: RE | Admit: 2019-08-28 | Discharge: 2019-08-28 | Disposition: A | Discharge status: Home / Self Care | Payer: Medicare Other | Source: Ambulatory Visit | Attending: Gastroenterology | Admitting: Gastroenterology

## 2019-08-28 ENCOUNTER — Encounter (HOSPITAL_COMMUNITY): Payer: Self-pay

## 2019-08-28 ENCOUNTER — Other Ambulatory Visit: Payer: Self-pay

## 2019-08-28 DIAGNOSIS — R140 Abdominal distension (gaseous): Secondary | ICD-10-CM | POA: Diagnosis not present

## 2019-08-28 DIAGNOSIS — R1030 Lower abdominal pain, unspecified: Secondary | ICD-10-CM | POA: Diagnosis not present

## 2019-08-28 DIAGNOSIS — R109 Unspecified abdominal pain: Secondary | ICD-10-CM | POA: Diagnosis not present

## 2019-08-28 MED ORDER — IOHEXOL 300 MG/ML  SOLN
100.0000 mL | Freq: Once | INTRAMUSCULAR | Status: AC | PRN
  Administered 2019-08-28: 100 mL via INTRAVENOUS

## 2019-08-28 MED ORDER — SODIUM CHLORIDE (PF) 0.9 % IJ SOLN
INTRAMUSCULAR | Status: AC
  Filled 2019-08-28: qty 50

## 2019-09-09 DIAGNOSIS — M1711 Unilateral primary osteoarthritis, right knee: Secondary | ICD-10-CM | POA: Diagnosis not present

## 2019-09-16 DIAGNOSIS — M25551 Pain in right hip: Secondary | ICD-10-CM | POA: Diagnosis not present

## 2019-09-16 DIAGNOSIS — M1711 Unilateral primary osteoarthritis, right knee: Secondary | ICD-10-CM | POA: Diagnosis not present

## 2019-09-30 DIAGNOSIS — M1611 Unilateral primary osteoarthritis, right hip: Secondary | ICD-10-CM | POA: Diagnosis not present

## 2019-09-30 DIAGNOSIS — M1711 Unilateral primary osteoarthritis, right knee: Secondary | ICD-10-CM | POA: Diagnosis not present

## 2019-10-11 DIAGNOSIS — M25551 Pain in right hip: Secondary | ICD-10-CM | POA: Diagnosis not present

## 2019-10-11 DIAGNOSIS — M1611 Unilateral primary osteoarthritis, right hip: Secondary | ICD-10-CM | POA: Diagnosis not present

## 2019-10-22 DIAGNOSIS — R300 Dysuria: Secondary | ICD-10-CM | POA: Diagnosis not present

## 2019-10-28 DIAGNOSIS — H35372 Puckering of macula, left eye: Secondary | ICD-10-CM | POA: Diagnosis not present

## 2019-10-28 DIAGNOSIS — H35033 Hypertensive retinopathy, bilateral: Secondary | ICD-10-CM | POA: Diagnosis not present

## 2019-10-28 DIAGNOSIS — E113293 Type 2 diabetes mellitus with mild nonproliferative diabetic retinopathy without macular edema, bilateral: Secondary | ICD-10-CM | POA: Diagnosis not present

## 2019-10-28 DIAGNOSIS — H3552 Pigmentary retinal dystrophy: Secondary | ICD-10-CM | POA: Diagnosis not present

## 2019-11-04 DIAGNOSIS — M25551 Pain in right hip: Secondary | ICD-10-CM | POA: Diagnosis not present

## 2019-11-10 ENCOUNTER — Other Ambulatory Visit: Payer: Self-pay

## 2019-11-10 ENCOUNTER — Emergency Department (HOSPITAL_COMMUNITY): Payer: Medicare Other

## 2019-11-10 ENCOUNTER — Emergency Department (HOSPITAL_COMMUNITY)
Admission: EM | Admit: 2019-11-10 | Discharge: 2019-11-11 | Disposition: A | Discharge status: Home / Self Care | Payer: Medicare Other | Attending: Emergency Medicine | Admitting: Emergency Medicine

## 2019-11-10 DIAGNOSIS — Z96652 Presence of left artificial knee joint: Secondary | ICD-10-CM | POA: Insufficient documentation

## 2019-11-10 DIAGNOSIS — Z79899 Other long term (current) drug therapy: Secondary | ICD-10-CM | POA: Insufficient documentation

## 2019-11-10 DIAGNOSIS — R55 Syncope and collapse: Secondary | ICD-10-CM | POA: Insufficient documentation

## 2019-11-10 DIAGNOSIS — R1030 Lower abdominal pain, unspecified: Secondary | ICD-10-CM | POA: Diagnosis not present

## 2019-11-10 DIAGNOSIS — E119 Type 2 diabetes mellitus without complications: Secondary | ICD-10-CM | POA: Diagnosis not present

## 2019-11-10 DIAGNOSIS — Z7984 Long term (current) use of oral hypoglycemic drugs: Secondary | ICD-10-CM | POA: Diagnosis not present

## 2019-11-10 DIAGNOSIS — M25551 Pain in right hip: Secondary | ICD-10-CM | POA: Insufficient documentation

## 2019-11-10 DIAGNOSIS — I129 Hypertensive chronic kidney disease with stage 1 through stage 4 chronic kidney disease, or unspecified chronic kidney disease: Secondary | ICD-10-CM | POA: Diagnosis not present

## 2019-11-10 DIAGNOSIS — N189 Chronic kidney disease, unspecified: Secondary | ICD-10-CM | POA: Diagnosis not present

## 2019-11-10 DIAGNOSIS — R42 Dizziness and giddiness: Secondary | ICD-10-CM | POA: Diagnosis not present

## 2019-11-10 LAB — BASIC METABOLIC PANEL
Anion gap: 10 (ref 5–15)
BUN: 17 mg/dL (ref 8–23)
CO2: 26 mmol/L (ref 22–32)
Calcium: 9.2 mg/dL (ref 8.9–10.3)
Chloride: 96 mmol/L — ABNORMAL LOW (ref 98–111)
Creatinine, Ser: 1.14 mg/dL — ABNORMAL HIGH (ref 0.44–1.00)
GFR calc Af Amer: 53 mL/min — ABNORMAL LOW (ref >60)
GFR calc non Af Amer: 46 mL/min — ABNORMAL LOW (ref >60)
Glucose, Bld: 192 mg/dL — ABNORMAL HIGH (ref 70–99)
Potassium: 4.2 mmol/L (ref 3.5–5.1)
Sodium: 132 mmol/L — ABNORMAL LOW (ref 135–145)

## 2019-11-10 LAB — CBC WITH DIFFERENTIAL/PLATELET
Abs Immature Granulocytes: 0.03 10*3/uL (ref 0.00–0.07)
Basophils Absolute: 0 10*3/uL (ref 0.0–0.1)
Basophils Relative: 1 %
Eosinophils Absolute: 0.2 10*3/uL (ref 0.0–0.5)
Eosinophils Relative: 3 %
HCT: 35.8 % — ABNORMAL LOW (ref 36.0–46.0)
Hemoglobin: 11.7 g/dL — ABNORMAL LOW (ref 12.0–15.0)
Immature Granulocytes: 1 %
Lymphocytes Relative: 11 %
Lymphs Abs: 0.7 10*3/uL (ref 0.7–4.0)
MCH: 31.7 pg (ref 26.0–34.0)
MCHC: 32.7 g/dL (ref 30.0–36.0)
MCV: 97 fL (ref 80.0–100.0)
Monocytes Absolute: 0.8 10*3/uL (ref 0.1–1.0)
Monocytes Relative: 13 %
Neutro Abs: 4.6 10*3/uL (ref 1.7–7.7)
Neutrophils Relative %: 71 %
Platelets: 277 10*3/uL (ref 150–400)
RBC: 3.69 MIL/uL — ABNORMAL LOW (ref 3.87–5.11)
RDW: 13.5 % (ref 11.5–15.5)
WBC: 6.4 10*3/uL (ref 4.0–10.5)
nRBC: 0 % (ref 0.0–0.2)

## 2019-11-10 LAB — URINALYSIS, ROUTINE W REFLEX MICROSCOPIC
Bilirubin Urine: NEGATIVE
Glucose, UA: NEGATIVE mg/dL
Hgb urine dipstick: NEGATIVE
Ketones, ur: NEGATIVE mg/dL
Leukocytes,Ua: NEGATIVE
Nitrite: NEGATIVE
Protein, ur: NEGATIVE mg/dL
Specific Gravity, Urine: 1.015 (ref 1.005–1.030)
pH: 6 (ref 5.0–8.0)

## 2019-11-10 LAB — CBG MONITORING, ED: Glucose-Capillary: 181 mg/dL — ABNORMAL HIGH (ref 70–99)

## 2019-11-10 MED ORDER — TRAMADOL HCL 50 MG PO TABS
50.0000 mg | ORAL_TABLET | Freq: Once | ORAL | Status: AC
  Administered 2019-11-10: 50 mg via ORAL
  Filled 2019-11-10: qty 1

## 2019-11-10 MED ORDER — SODIUM CHLORIDE 0.9 % IV BOLUS: 500.0000 mL | Freq: Once | INTRAVENOUS | Status: DC

## 2019-11-10 MED ORDER — TRAMADOL HCL 50 MG PO TABS: 50.0000 mg | ORAL_TABLET | Freq: Three times a day (TID) | ORAL | 0 refills | Status: DC | PRN

## 2019-11-10 NOTE — ED Triage Notes (Addendum)
Patient states that she had a witnessed syncopal episode at home patient passed out for a minute up to 5 minutes per family after supper while sitting in a chair. EMS was called out and patient refused transport. Patient denies an pain at this time other than leg pain x 1 month. Patient states dizziness and unable to focus prior to the syncopal episode. Patient alert and oriented at this time.

## 2019-11-10 NOTE — Discharge Instructions (Signed)
You are seen in the emergency department for evaluation of syncope or fainting.  You had blood work EKG and a CAT scan of your head that did not show any acute findings.  You also were complaining of a months worth of right hip pain and you had a CAT scan of your pelvis that showed some arthritis in your hips and a little bit of fluid around the joint.  This will need to be closely followed with your orthopedic doctor.  Please keep well-hydrated and follow-up with your doctor.  Return if any worsening or concerning symptoms.

## 2019-11-10 NOTE — ED Provider Notes (Signed)
[facility] EMERGENCY DEPARTMENT Provider Note   CSN: [id number] Arrival date & time: 11/10/19  2046     History Chief Complaint  Patient presents with  . Loss of Consciousness    Carolyn [name] is a 79 y.o. female.  She was sitting at the table after dinner with her family.  Her daughter had noticed that she had spilled her cup of water and then saw her eyes rolled back in her head.  She was unresponsive for a few minutes and then began mumbling.  EMS was called and by the time they got there she was feeling back to baseline.  She declined transport and came by family transportation.  No reported seizure activity no incontinence of urine.  No recent illness although she has been troubled by right groin and thigh pain for a month.  Causes her to have pain with ambulation or movement.  No headache blurry vision chest pain shortness of breath abdominal pain vomiting diarrhea or urinary symptoms.  No focal numbness or weakness.  Just prior to it happening she felt very lightheaded  The history is provided by the patient.  Loss of Consciousness Episode history:  Single Most recent episode:  Today Progression:  Resolved Chronicity:  New Context: normal activity and sitting down   Witnessed: yes   Relieved by: time. Worsened by:  Nothing Ineffective treatments:  None tried Associated symptoms: dizziness   Associated symptoms: no chest pain, no diaphoresis, no difficulty breathing, no fever, no focal weakness, no headaches, no nausea, no recent fall, no rectal bleeding, no seizures, no shortness of breath and no vomiting        Past Medical History:  Diagnosis Date  . Anxiety   . Arthritis   . CKD (chronic kidney disease)   . Complication of anesthesia    " my heart rate gets low"  . Depression   . Depression   . Diabetes mellitus ([place])    type 2  . GERD (gastroesophageal reflux disease)   . Hepatic steatosis   . Hyperlipidemia   . Hyperparathyroidism, primary ([place])   .  Hypertension   . IBS (irritable bowel syndrome)   . Internal hemorrhoid   . OA (osteoarthritis)   . Tubular adenoma of colon 1999   colonoscopy  . Wears dentures   . Wears glasses     Patient Active Problem List   Diagnosis Date Noted  . Abdominal bloating 08/22/2019  . Lower abdominal pain 08/22/2019  . OA (osteoarthritis) of knee 05/07/2018  . Hyperparathyroidism, primary ([place]) 04/25/2017  . Falls 07/19/2016  . Precordial chest pain 12/23/2014  . Hypercalcemia 12/23/2014  . Diabetes ([place]) 10/23/2013  . HTN (hypertension) 10/23/2013    Past Surgical History:  Procedure Laterality Date  . ANKLE FRACTURE SURGERY Left yrs ago  . BACK SURGERY  04/2011   lower fpr spinal stenosis and ruptured disc  . COLONOSCOPY W/ BIOPSIES AND POLYPECTOMY    . PARATHYROIDECTOMY Right 04/27/2017   Procedure: RIGHT PARATHYROIDECTOMY;  Surgeon: Armandina Gemma, MD;  Location: [facility];  Service: General;  Laterality: Right;  . TONSILLECTOMY  as child   and adenoids  . TOTAL KNEE ARTHROPLASTY Left 05/07/2018   Procedure: LEFT TOTAL KNEE ARTHROPLASTY;  Surgeon: Gaynelle Arabian, MD;  Location: [facility];  Service: Orthopedics;  Laterality: Left;  50 mins  . VAGINAL HYSTERECTOMY  1964   partial     OB History   No obstetric history on file.     Family History  Problem  Relation Age of Onset  . Stomach cancer Sister   . Diabetes Sister   . Colon polyps Sister        hyperplastic  . High blood pressure Sister   . Colon cancer Neg Hx   . Breast cancer Neg Hx     Social History   Tobacco Use  . Smoking status: Never Smoker  . Smokeless tobacco: Never Used  Substance Use Topics  . Alcohol use: Never  . Drug use: Never    Home Medications Prior to Admission medications   Medication Sig Start Date End Date Taking? Authorizing Provider  citalopram (CELEXA) 20 MG tablet Take 20 mg by mouth daily with breakfast.  01/05/12   [provider]  hydrocortisone (ANUSOL-HC) 2.5 % rectal cream  Place 1 application rectally as needed for hemorrhoids or anal itching.    [provider]  Liniments (SALONPAS PAIN RELIEF PATCH EX) Apply 1 patch topically daily as needed (pain).    [provider]  losartan-hydrochlorothiazide (HYZAAR) 100-12.5 MG per tablet Take 1 tablet by mouth daily with breakfast.  12/16/11   [provider]  metFORMIN (GLUCOPHAGE-XR) 500 MG 24 hr tablet Take 500-1,000 mg by mouth See admin instructions. Take 2 tablets in the morning, and 1 tablet in the evening. 11/23/11   [provider]  methotrexate (RHEUMATREX) 2.5 MG tablet Take 2.5 mg by mouth once a week. Caution:Chemotherapy. Protect from light.  Take 9 pills once a week    [provider]  omeprazole (PRILOSEC) 20 MG capsule Take 20 mg by mouth daily before breakfast.  11/12/11   [provider]  traZODone (DESYREL) 50 MG tablet Take 50 mg by mouth at bedtime.    [provider]    Allergies    Sulfa antibiotics  Review of Systems   Review of Systems  Constitutional: Negative for diaphoresis and fever.  HENT: Negative for sore throat.   Eyes: Negative for visual disturbance.  Respiratory: Negative for shortness of breath.   Cardiovascular: Positive for syncope. Negative for chest pain.  Gastrointestinal: Negative for abdominal pain, nausea and vomiting.  Genitourinary: Negative for dysuria.  Musculoskeletal: Positive for gait problem.  Skin: Negative for rash.  Neurological: Positive for dizziness. Negative for focal weakness, seizures and headaches.    Physical Exam Updated Vital Signs BP (!) 151/82 (BP Location: Left Arm)   Pulse 72   Temp 97.9 F (36.6 C) (Oral)   Resp 18   SpO2 96%   Physical Exam Vitals and nursing note reviewed.  Constitutional:      General: She is not in acute distress.    Appearance: Normal appearance. She is well-developed.  HENT:     Head: Normocephalic and atraumatic.  Eyes:     Conjunctiva/sclera:  Conjunctivae normal.  Cardiovascular:     Rate and Rhythm: Normal rate and regular rhythm.     Heart sounds: No murmur.  Pulmonary:     Effort: Pulmonary effort is normal. No respiratory distress.     Breath sounds: Normal breath sounds.  Abdominal:     Palpations: Abdomen is soft.     Tenderness: There is no abdominal tenderness.  Musculoskeletal:        General: Tenderness (right groin hip with movement) present.     Cervical back: Neck supple.     Right lower leg: No edema.     Left lower leg: No edema.     Comments: No cords or edema  Skin:    General:  Skin is warm and dry.     Capillary Refill: Capillary refill takes less than 2 seconds.  Neurological:     General: No focal deficit present.     Mental Status: She is alert and oriented to person, place, and time.     Cranial Nerves: No cranial nerve deficit.     Sensory: No sensory deficit.     Motor: No weakness.     ED Results / Procedures / Treatments   Labs (all labs ordered are listed, but only abnormal results are displayed) Labs Reviewed  BASIC METABOLIC PANEL - Abnormal; Notable for the following components:      Result Value   Sodium 132 (*)    Chloride 96 (*)    Glucose, Bld 192 (*)    Creatinine, Ser 1.14 (*)    GFR calc non Af Amer 46 (*)    GFR calc Af Amer 53 (*)    All other components within normal limits  CBC WITH DIFFERENTIAL/PLATELET - Abnormal; Notable for the following components:   RBC 3.69 (*)    Hemoglobin 11.7 (*)    HCT 35.8 (*)    All other components within normal limits  CBG MONITORING, ED - Abnormal; Notable for the following components:   Glucose-Capillary 181 (*)    All other components within normal limits  URINALYSIS, ROUTINE W REFLEX MICROSCOPIC    EKG EKG Interpretation  Date/Time:  Sunday November 10 2019 21:07:54 EDT Ventricular Rate:  72 PR Interval:    QRS Duration: 95 QT Interval:  379 QTC Calculation: 415 R Axis:   41 Text Interpretation: Sinus rhythm Abnormal  R-wave progression, early transition Baseline wander in lead(s) V3 No significant change since 9/19 Confirmed by Aletta Edouard [phone]) on 11/10/2019 9:10:06 PM   Radiology CT Head Wo Contrast  Result Date: 11/10/2019 CLINICAL DATA:  Nonspecific dizziness. Syncope. EXAM: CT HEAD WITHOUT CONTRAST TECHNIQUE: Contiguous axial images were obtained from the base of the skull through the vertex without intravenous contrast. COMPARISON:  Brain MRI 05/14/2016 FINDINGS: Brain: No intracranial hemorrhage, mass effect, or midline shift. Generalized atrophy, similar to prior. There is moderate to advanced generalized chronic small vessel ischemia. No hydrocephalus. The basilar cisterns are patent. No evidence of territorial infarct or acute ischemia. No extra-axial or intracranial fluid collection. Vascular: Atherosclerosis of skullbase vasculature without hyperdense vessel or abnormal calcification. Skull: No fracture or focal lesion. Sinuses/Orbits: Paranasal sinuses and mastoid air cells are clear. The visualized orbits are unremarkable. Other: None. IMPRESSION: 1. No acute intracranial abnormality. 2. Unchanged atrophy and chronic small vessel ischemia. Electronically Signed   By: Keith Rake M.D.   On: 11/10/2019 22:39   CT PELVIS WO CONTRAST  Result Date: 11/10/2019 CLINICAL DATA:  Right hip/groin pain for 1 month. EXAM: CT PELVIS WITHOUT CONTRAST TECHNIQUE: Multidetector CT imaging of the pelvis was performed following the standard protocol without intravenous contrast. COMPARISON:  Abdominopelvic CT reformats 08/28/2019. FINDINGS: Urinary Tract: Distal ureters are decompressed. Urinary bladder is physiologically distended. No wall thickening. Bowel: Occasional colonic diverticula. No bowel inflammation or diverticulitis. Normal appendix visualized. Pelvic bowel loops are unremarkable. Vascular/Lymphatic: Aorto bi-iliac atherosclerosis. No adenopathy. Reproductive:  Hysterectomy. No adnexal mass. Ovaries are  quiescent. Other:  No pelvic free fluid. No inguinal hernia. Musculoskeletal: Right hip joint effusion may be due to prior hip injection 10/11/2019. Moderate to advanced bilateral hip osteoarthritis, right greater than left, with joint space narrowing, subchondral cystic change, and peripheral osteophytes. No fracture or evidence of avascular necrosis. Pubic rami  are intact. Chondrocalcinosis and degenerative change of the pubic symphysis. No periosteal reaction or bony destruction. Degenerative vacuum phenomenon within both sacroiliac joints. No focal bone lesion demonstrated by CT. Stable degenerative change in the lower lumbar spine with anterolisthesis of L4 on L5. IMPRESSION: 1. Moderate to advanced bilateral hip osteoarthritis, right greater than left. No fracture or acute osseous abnormality. 2. Right hip joint effusion may be due to prior hip injection 10/11/2019. 3. No acute abnormality in the pelvis. Aortic Atherosclerosis (ICD10-I70.0). Electronically Signed   By: Keith Rake M.D.   On: 11/10/2019 22:45   DG Chest Port 1 View  Result Date: 11/10/2019 CLINICAL DATA:  Syncope. EXAM: PORTABLE CHEST 1 VIEW COMPARISON:  May 25, 2016 FINDINGS: There is no evidence of acute infiltrate, pleural effusion or pneumothorax. The heart size and mediastinal contours are within normal limits. There is mild to moderate severity calcification of the aortic arch. The visualized skeletal structures are unremarkable. IMPRESSION: No active disease. Electronically Signed   By: Virgina Norfolk M.D.   On: 11/10/2019 22:20    Procedures Procedures (including critical care time)  Medications Ordered in ED Medications  traMADol (ULTRAM) tablet 50 mg (50 mg Oral Given 11/10/19 2319)    ED Course  I have reviewed the triage vital signs and the nursing notes.  Pertinent labs & imaging results that were available during my care of the patient were reviewed by me and considered in my medical decision making (see  chart for details).  Clinical Course as of Nov 10 900  Nancy Fetter Nov 10, 2019  2213 Hemoglobin low 11.7 but stable for a year.   [MB]  2214 Chest x-ray interpreted by me as no pneumothorax no infiltrates no effusions.   [MB]    Clinical Course User Index [MB] Hayden Rasmussen, MD   MDM Rules/Calculators/A&P                     This patient complains of syncope and right hip pain; this involves an extensive number of treatment Options and is a complaint that carries with it a high risk of complications and Morbidity. The differential includes arrhythmia stroke vasovagal hypovolemia anemia  I ordered, reviewed and interpreted labs, which included hemoglobin at baseline.  Normal white cell count.  Chemistry showing a slightly elevated creatinine of 1.1 for possible dehydration.  Urinalysis unremarkable I ordered medication tramadol for her leg pain with improvement in her pain symptoms I ordered imaging studies which included CT head and CT pelvis and I independently    visualized and interpreted imaging which showed CT head was unremarkable showing some age-related atrophy and CT pelvis did not show any obvious fractures. Additional history obtained from daughter  Reviewed telemetry monitoring which showed no arrhythmias  After the interventions stated above, I reevaluated the patient and found the patient to be improved.  She ambulated with assistance to the bathroom I discussed all the results with the patient and her daughter and answered her questions to the best my ability recommended that they follow-up with orthopedics for continued work-up of her hip pain.    Final Clinical Impression(s) / ED Diagnoses Final diagnoses:  Syncope and collapse  Right hip pain    Rx / DC Orders ED Discharge Orders         Ordered    traMADol (ULTRAM) 50 MG tablet  Every 8 hours PRN     11/10/19 2320           Melina Copa,  Rebeca Alert, MD 11/11/19 [phone]

## 2019-11-10 NOTE — ED Notes (Signed)
Patient drinking oral fluids at this time

## 2019-11-13 DIAGNOSIS — M1611 Unilateral primary osteoarthritis, right hip: Secondary | ICD-10-CM | POA: Diagnosis not present

## 2019-11-13 DIAGNOSIS — M25551 Pain in right hip: Secondary | ICD-10-CM | POA: Diagnosis not present

## 2019-11-20 DIAGNOSIS — M150 Primary generalized (osteo)arthritis: Secondary | ICD-10-CM | POA: Diagnosis not present

## 2019-11-20 DIAGNOSIS — E1122 Type 2 diabetes mellitus with diabetic chronic kidney disease: Secondary | ICD-10-CM | POA: Diagnosis not present

## 2019-11-20 DIAGNOSIS — M2550 Pain in unspecified joint: Secondary | ICD-10-CM | POA: Diagnosis not present

## 2019-11-20 DIAGNOSIS — E782 Mixed hyperlipidemia: Secondary | ICD-10-CM | POA: Diagnosis not present

## 2019-11-20 DIAGNOSIS — E1165 Type 2 diabetes mellitus with hyperglycemia: Secondary | ICD-10-CM | POA: Diagnosis not present

## 2019-11-20 DIAGNOSIS — E1129 Type 2 diabetes mellitus with other diabetic kidney complication: Secondary | ICD-10-CM | POA: Diagnosis not present

## 2019-11-20 DIAGNOSIS — M0609 Rheumatoid arthritis without rheumatoid factor, multiple sites: Secondary | ICD-10-CM | POA: Diagnosis not present

## 2019-11-20 DIAGNOSIS — K219 Gastro-esophageal reflux disease without esophagitis: Secondary | ICD-10-CM | POA: Diagnosis not present

## 2019-11-25 NOTE — Progress Notes (Addendum)
[id number]    Provider:  Dr Jaynee Eagles Requesting Provider: Caryl Bis, MD Primary Care Provider:  Caryl Bis, MD  CC:  Syncope and collapse  HPI:  Carolyn [name] is a 79 y.o. female here as requested by Caryl Bis, MD for tia vs seizure. PMHx OA, IBS, hypertension, primary hyperparathyroidism, hyperlipidemia, hepatic steatosis, GERD, diabetes, depression, chronic kidney disease, anxiety. She was sitting on a bar stool, she felt like something was happening ike she was "fading out", she dropped her tea and fell back against the wall, she didn't shut her eyes, no shaking, no tongue biting or urination, her skin turned ash looking, about 5 - 6 minutes, when she came to everything was fine. No weakness, no facial droop, never had a seizure in the past. Her daughter provides lots of information, they were in the kitchen on a bar stool at the end of the day on Easter, her head leaned back, her eyes were wide open, they were calling her name and no response, no shaking, biting or tongue or urinating, lasted 6-7 minutes and she was gray and ashy. They put her in a chair. She came to and not post-ictal. No facial droop. No focal weakness and was fine afterwards.   Reviewed notes, labs and imaging from outside physicians, which showed:  Labs collected November 04, 2019 include unremarkable CBC with hemoglobin 11.8 and hematocrit 35.7 and platelets 251.  BMP on November 10, 2019 showed a low sodium, creatinine 1.14, sodium 132, BUN 17, glucose 192.   I reviewed notes from emergency room, diagnosed with syncope and collapse, right hip pain, CT of the head showed no acute intracranial abnormality but did show atrophy and chronic small vessel microvascular disease (personally reviewed images).  I reviewed Dr. Olena Heckle notes, patient had a recent visit to [facility], ED, for syncopal episode, in the setting of pain hip pain CT scans did confirm osteoarthritis of both hips worse on the  right, the pain occasionally radiates down the entire leg, she is ambulating with a walker.  She was sitting at the table after dinner with her family, her daughter had noticed that she had spilled her cup of water and then saw her eyes rolled back in her head, she was unresponsive for a few minutes and then began mumbling, by the time EMS got there she was feeling back to baseline, she declined transport and came by family transportation, no reported seizure activity, no incontinence of urine, no recent illnesses other than troubled by the the pain in her hips causing her to have pain with ambulation, no headache blurry vision chest pain shortness of breath abdominal pain vomiting diarrhea urinary symptoms, no focal weakness or numbness, just prior to it happening she felt very lightheaded. Review of Systems: Patient complains of symptoms per HPI as well as the following symptoms: hip pain. Pertinent negatives and positives per HPI. All others negative.   Social History   Socioeconomic History  . Marital status: Widowed    Spouse name: Not on file  . Number of children: 3  . Years of education: 10  . Highest education level: Not on file  Occupational History  . Occupation: retired  Tobacco Use  . Smoking status: Never Smoker  . Smokeless tobacco: Never Used  Substance and Sexual Activity  . Alcohol use: Never  . Drug use: Never  . Sexual activity: Not on file  Other Topics Concern  . Not on file  Social History Narrative  Lives alone   Drinks caffeine free diet soda daily   Left-handed   Social Determinants of Health   Financial Resource Strain:   . Difficulty of Paying Living Expenses:   Food Insecurity:   . Worried About Charity fundraiser in the Last Year:   . Arboriculturist in the Last Year:   Transportation Needs:   . Film/video editor (Medical):   Marland Kitchen Lack of Transportation (Non-Medical):   Physical Activity:   . Days of Exercise per Week:   . Minutes of Exercise  per Session:   Stress:   . Feeling of Stress :   Social Connections:   . Frequency of Communication with Friends and Family:   . Frequency of Social Gatherings with Friends and Family:   . Attends Religious Services:   . Active Member of Clubs or Organizations:   . Attends Archivist Meetings:   Marland Kitchen Marital Status:   Intimate Partner Violence:   . Fear of Current or Ex-Partner:   . Emotionally Abused:   Marland Kitchen Physically Abused:   . Sexually Abused:     Family History  Problem Relation Age of Onset  . Stomach cancer Sister   . Diabetes Sister   . Colon polyps Sister        hyperplastic  . High blood pressure Sister   . Alzheimer's disease Mother   . Suicidality Father   . Colon cancer Neg Hx   . Breast cancer Neg Hx     Past Medical History:  Diagnosis Date  . Anxiety   . Arthritis   . CKD (chronic kidney disease)   . Complication of anesthesia    " my heart rate gets low"  . Depression   . Depression   . Diabetes mellitus ([place])    type 2  . GERD (gastroesophageal reflux disease)   . Hepatic steatosis   . Hyperlipidemia   . Hyperparathyroidism, primary ([place])   . Hypertension   . IBS (irritable bowel syndrome)   . Internal hemorrhoid   . OA (osteoarthritis)   . Tubular adenoma of colon 1999   colonoscopy  . Wears dentures   . Wears glasses     Patient Active Problem List   Diagnosis Date Noted  . Syncope and collapse 11/26/2019  . Abdominal bloating 08/22/2019  . Lower abdominal pain 08/22/2019  . OA (osteoarthritis) of knee 05/07/2018  . Hyperparathyroidism, primary ([place]) 04/25/2017  . Falls 07/19/2016  . Precordial chest pain 12/23/2014  . Hypercalcemia 12/23/2014  . Diabetes ([place]) 10/23/2013  . HTN (hypertension) 10/23/2013    Past Surgical History:  Procedure Laterality Date  . ANKLE FRACTURE SURGERY Left yrs ago  . BACK SURGERY  04/2011   lower fpr spinal stenosis and ruptured disc  . COLONOSCOPY W/ BIOPSIES AND POLYPECTOMY    .  LAMINECTOMY  2009   Dr. Gladstone Lighter  . LUMBAR DISC SURGERY  04/13/2011   L3, L4 Dr. Amil Amen  . PARATHYROIDECTOMY Right 04/27/2017   Procedure: RIGHT PARATHYROIDECTOMY;  Surgeon: Armandina Gemma, MD;  Location: [facility];  Service: General;  Laterality: Right;  . TONSILLECTOMY  as child   and adenoids  . TOTAL KNEE ARTHROPLASTY Left 05/07/2018   Procedure: LEFT TOTAL KNEE ARTHROPLASTY;  Surgeon: Gaynelle Arabian, MD;  Location: [facility];  Service: Orthopedics;  Laterality: Left;  50 mins  . VAGINAL HYSTERECTOMY  1964   partial  . vein stripping R leg      Current Outpatient Medications  Medication  Sig Dispense Refill  . citalopram (CELEXA) 20 MG tablet Take 20 mg by mouth daily with breakfast.     . folic acid (FOLVITE) 1 MG tablet Take 1 mg by mouth daily.    . hydrocortisone (ANUSOL-HC) 2.5 % rectal cream Place 1 application rectally as needed for hemorrhoids or anal itching.    Marland Kitchen Hydrocortisone Ace-Pramoxine (PROCTOCREAM-HC RE) Place 1 application rectally in the morning, at noon, and at bedtime.    . Liniments (SALONPAS PAIN RELIEF PATCH EX) Apply 1 patch topically daily as needed (pain).    Marland Kitchen losartan-hydrochlorothiazide (HYZAAR) 100-12.5 MG per tablet Take 1 tablet by mouth daily with breakfast.     . metFORMIN (GLUCOPHAGE-XR) 500 MG 24 hr tablet Take 500-1,000 mg by mouth See admin instructions. Take 2 tablets in the morning, and 1 tablet in the evening.    . methotrexate (RHEUMATREX) 2.5 MG tablet Take 2.5 mg by mouth once a week. Caution:Chemotherapy. Protect from light.  Take 9 pills once a week    . omeprazole (PRILOSEC) 20 MG capsule Take 20 mg by mouth daily before breakfast.     . predniSONE (DELTASONE) 20 MG tablet Take 20 mg by mouth 2 (two) times daily.    . traMADol (ULTRAM) 50 MG tablet Take 1 tablet (50 mg total) by mouth every 8 (eight) hours as needed for severe pain. 15 tablet 0  . traZODone (DESYREL) 50 MG tablet Take 200 mg by mouth at bedtime.     . triamcinolone cream (KENALOG)  0.1 % Apply 1 application topically 2 (two) times daily as needed.     No current facility-administered medications for this visit.    Allergies as of 11/26/2019 - Review Complete 11/26/2019  Allergen Reaction Noted  . Ace inhibitors  11/26/2019  . Misc nat hmg coa reduct inhib [cholestin]  11/26/2019  . Zetia [ezetimibe]  11/26/2019  . Sulfa antibiotics Rash 01/10/2012    Vitals: BP 137/84 (BP Location: Left Arm, Patient Position: Sitting)   Pulse 73   Temp (!) 97.1 F (36.2 C) Comment: taken at front  Ht 5\' 7"  (1.702 m)   Wt 165 lb (74.8 kg)   BMI 25.84 kg/m  Last Weight:  Wt Readings from Last 1 Encounters:  11/26/19 165 lb (74.8 kg)   Last Height:   Ht Readings from Last 1 Encounters:  11/26/19 5\' 7"  (1.702 m)     Physical exam: Exam: Gen: NAD, conversant, well nourised, well groomed                     CV: RRR, no MRG. No Carotid Bruits. No peripheral edema, warm, nontender Eyes: Conjunctivae clear without exudates or hemorrhage  Neuro: Detailed Neurologic Exam  Speech:    Speech is normal; fluent and spontaneous with normal comprehension.  Cognition:    The patient is oriented to person, place, and time;     recent and remote memory intact;     language fluent;     normal attention, concentration,     fund of knowledge Cranial Nerves:    The pupils are equal, round, and reactive to light. Pupils too small to evaluate fundi.. Visual fields are full to finger confrontation. Extraocular movements are intact. Trigeminal sensation is intact and the muscles of mastication are normal. The face is symmetric. The palate elevates in the midline. Hearing intact. Voice is normal. Shoulder shrug is normal. The tongue has normal motion without fasciculations.   Coordination:    No dysmetria  Gait:    Antalgic with cane  Motor Observation:    No asymmetry, no atrophy, and no involuntary movements noted. Tone:    Normal muscle tone.    Posture:    Posture is  normal. normal erect    Strength:   right leg prox weakness (pain in right hip) otherwise intact     Sensation: intact to LT     Reflex Exam:  DTR's:    Trace AJs, slightly brisk patellars and biceps.  Toes:    The toes are equiv bilaterally.   Clonus:    Clonus is absent.    Assessment/Plan:  79 y.o. female here as requested by Caryl Bis, MD for tia vs seizure. PMHx OA, IBS, hypertension, primary hyperparathyroidism, hyperlipidemia, hepatic steatosis, GERD, diabetes, depression, chronic kidney disease, anxiety.  She was sitting on a bar stool, she felt like something was happening ike she was "fading out", she dropped her tea and fell back against the wall, she didn't shut her eyes, no shaking, no tongue biting or urination, her skin turned ash looking, about 5 - 6 minutes not responsive,  when she "came to" everythin was fine per patient.   (Addendum 12/26/2019: I spoke to patient today. I reviewed the MRI of her brain and there are old lacunar infarct however they were also there in her 2017 MRI, I also reviewed the images with Dr. Leonie Man who is a neuroradiologist and head of stroke and we do not think the cerebellar finding is a stroke but it appears to be a blood vessel. I don;t think there are any new findings on MRI brain. EEG normal, no epileptiform activity. CTA of the head and neck normal. She does have chronic microvascular ischemia and the mentioned lacunar infarct so I advice aspirin 81mg  daily. No embolic strokes seen on MRI.   As far a hip surgery, I will sign off from neurology perspective. However I don't know why she had the syncopal episode and I think she should have the evaluation with Dr. Sherren Mocha for the PFO with shunting, she has an appointment with Dr. Burt Knack next week and a follow up with me in June.)   Unclear etiology, tia vs seizure vs cardiac event or other. She felt dizzy prior to the event and "faded out" and turned white, not postictal, no tongue  biting or urination or abnormal muscle activity. May have been  been vasovagal or cardiac. Still we have to perform thorough stroke and seizure evaluation.But recommend f/u with pcp for possible holter monitor or other cardiac workup/cards referral as clinically warranted by primary care. No driving for 6 months event free.Recommend pcp check lipids and hgba1c.   Addendum: PFO found on Echo at least oderately sized if we think this may have been a TIA may consider closure or Eliquis however patient has still not completed the workup including MRI brain, CTA of the head and neck, we will call her in the meantime referred to Dr. Burt Knack in cardiology.   Orders Placed This Encounter  Procedures  . MR BRAIN W WO CONTRAST  . CT ANGIO HEAD W OR WO CONTRAST  . CT ANGIO NECK W OR WO CONTRAST  . ECHOCARDIOGRAM COMPLETE BUBBLE STUDY  . EEG   No orders of the defined types were placed in this encounter.   Cc: Caryl Bis, MD,    Sarina Ill, MD  [facility] [address] [place], [place] [address]  Phone [phone] Fax [phone]

## 2019-11-26 ENCOUNTER — Encounter: Payer: Self-pay | Admitting: *Deleted

## 2019-11-26 ENCOUNTER — Telehealth: Payer: Self-pay | Admitting: Neurology

## 2019-11-26 ENCOUNTER — Ambulatory Visit: Payer: Medicare Other | Admitting: Neurology

## 2019-11-26 ENCOUNTER — Encounter: Payer: Self-pay | Admitting: Neurology

## 2019-11-26 ENCOUNTER — Other Ambulatory Visit: Payer: Self-pay

## 2019-11-26 VITALS — BP 137/84 | HR 73 | Temp 97.1°F | Ht 67.0 in | Wt 165.0 lb

## 2019-11-26 DIAGNOSIS — R55 Syncope and collapse: Secondary | ICD-10-CM | POA: Diagnosis not present

## 2019-11-26 DIAGNOSIS — R4020 Unspecified coma: Secondary | ICD-10-CM

## 2019-11-26 DIAGNOSIS — R419 Unspecified symptoms and signs involving cognitive functions and awareness: Secondary | ICD-10-CM | POA: Diagnosis not present

## 2019-11-26 NOTE — Telephone Encounter (Signed)
[employer] medicare order sent to [facility]. No auth they will reach out to the patient to schedule.  

## 2019-11-26 NOTE — Progress Notes (Signed)
Source: referral notes from [facility] Dr Gar Ponto.

## 2019-11-26 NOTE — Patient Instructions (Addendum)
MRI of the brain Cat Scan of the blood vessels of the head and neck EEG Echocardiogram   Syncope  Syncope refers to a condition in which a person temporarily loses consciousness. Syncope may also be called fainting or passing out. It is caused by a sudden decrease in blood flow to the brain. Even though most causes of syncope are not dangerous, syncope can be a sign of a serious medical problem. Your health care provider may do tests to find the reason why you are having syncope. Signs that you may be about to faint include:  Feeling dizzy or light-headed.  Feeling nauseous.  Seeing all white or all black in your field of vision.  Having cold, clammy skin. If you faint, get medical help right away. Call your local emergency services (911 in the [place] Do not drive yourself to the hospital. Follow these instructions at home: Pay attention to any changes in your symptoms. Take these actions to stay safe and to help relieve your symptoms: Lifestyle  Do not drive, use machinery, or play sports until your health care provider says it is okay.  Do not drink alcohol.  Do not use any products that contain nicotine or tobacco, such as cigarettes and e-cigarettes. If you need help quitting, ask your health care provider.  Drink enough fluid to keep your urine pale yellow. General instructions  Take over-the-counter and prescription medicines only as told by your health care provider.  If you are taking blood pressure or heart medicine, get up slowly and take several minutes to sit and then stand. This can reduce dizziness or light-headedness.  Have someone stay with you until you feel stable.  If you start to feel like you might faint, lie down right away and raise (elevate) your feet above the level of your heart. Breathe deeply and steadily. Wait until all the symptoms have passed.  Keep all follow-up visits as told by your health care provider. This is important. Get help right away  if you:  Have a severe headache.  Faint once or repeatedly.  Have pain in your chest, abdomen, or back.  Have a very fast or irregular heartbeat (palpitations).  Have pain when you breathe.  Are bleeding from your mouth or rectum, or you have black or tarry stool.  Have a seizure.  Are confused.  Have trouble walking.  Have severe weakness.  Have vision problems. These symptoms may represent a serious problem that is an emergency. Do not wait to see if your symptoms will go away. Get medical help right away. Call your local emergency services (911 in the [place] Do not drive yourself to the hospital. Summary  Syncope refers to a condition in which a person temporarily loses consciousness. It is caused by a sudden decrease in blood flow to the brain.  Signs that you may be about to faint include dizziness, feeling light-headed, feeling nauseous, sudden vision changes, or cold, clammy skin.  Although most causes of syncope are not dangerous, syncope can be a sign of a serious medical problem. If you faint, get medical help right away. This information is not intended to replace advice given to you by your health care provider. Make sure you discuss any questions you have with your health care provider. Document Revised: 07/07/2017 Document Reviewed: 07/03/2017 Elsevier Patient Education  2020 [employer].

## 2019-11-27 ENCOUNTER — Ambulatory Visit: Payer: Medicare Other | Admitting: Neurology

## 2019-11-27 ENCOUNTER — Other Ambulatory Visit: Payer: Self-pay

## 2019-11-27 DIAGNOSIS — R55 Syncope and collapse: Secondary | ICD-10-CM

## 2019-11-27 DIAGNOSIS — R419 Unspecified symptoms and signs involving cognitive functions and awareness: Secondary | ICD-10-CM

## 2019-11-27 DIAGNOSIS — R4020 Unspecified coma: Secondary | ICD-10-CM

## 2019-11-28 NOTE — Telephone Encounter (Signed)
Scheduled at [place] for 12/06/19.

## 2019-11-28 NOTE — Telephone Encounter (Signed)
Patient wanted a sooner appt. I faxed the order to [facility] they will reach out to the patient to schedule.

## 2019-12-04 DIAGNOSIS — E782 Mixed hyperlipidemia: Secondary | ICD-10-CM | POA: Diagnosis not present

## 2019-12-04 DIAGNOSIS — E1122 Type 2 diabetes mellitus with diabetic chronic kidney disease: Secondary | ICD-10-CM | POA: Diagnosis not present

## 2019-12-04 DIAGNOSIS — R2689 Other abnormalities of gait and mobility: Secondary | ICD-10-CM | POA: Diagnosis not present

## 2019-12-04 DIAGNOSIS — I10 Essential (primary) hypertension: Secondary | ICD-10-CM | POA: Diagnosis not present

## 2019-12-04 DIAGNOSIS — Z0001 Encounter for general adult medical examination with abnormal findings: Secondary | ICD-10-CM | POA: Diagnosis not present

## 2019-12-06 DIAGNOSIS — E7849 Other hyperlipidemia: Secondary | ICD-10-CM | POA: Diagnosis not present

## 2019-12-06 DIAGNOSIS — R55 Syncope and collapse: Secondary | ICD-10-CM | POA: Diagnosis not present

## 2019-12-06 DIAGNOSIS — R404 Transient alteration of awareness: Secondary | ICD-10-CM | POA: Diagnosis not present

## 2019-12-06 DIAGNOSIS — Z8673 Personal history of transient ischemic attack (TIA), and cerebral infarction without residual deficits: Secondary | ICD-10-CM | POA: Diagnosis not present

## 2019-12-06 DIAGNOSIS — I10 Essential (primary) hypertension: Secondary | ICD-10-CM | POA: Diagnosis not present

## 2019-12-12 DIAGNOSIS — M1611 Unilateral primary osteoarthritis, right hip: Secondary | ICD-10-CM | POA: Diagnosis not present

## 2019-12-13 ENCOUNTER — Other Ambulatory Visit: Payer: Medicare Other

## 2019-12-16 ENCOUNTER — Other Ambulatory Visit (HOSPITAL_COMMUNITY): Payer: Medicare Other

## 2019-12-16 NOTE — Progress Notes (Signed)
PCP - Gar Ponto, MD w/surgical clearance dated 11-14-19  Neurologist- Ilda Mori, MD [place]   11-26-19  Cardiologist -   Chest x-ray - 11-10-19 EEG 11-28-19  EKG - 11-11-19 Stress Test -  ECHO -  Cardiac Cath -   Sleep Study -  CPAP -   Fasting Blood Sugar -  Checks Blood Sugar _____ times a day  Blood Thinner Instructions: Aspirin Instructions: Last Dose:  Anesthesia review:   Patient denies shortness of breath, fever, cough and chest pain at PAT appointment   Patient verbalized understanding of instructions that were given to them at the PAT appointment. Patient was also instructed that they will need to review over the PAT instructions again at home before surgery.

## 2019-12-16 NOTE — Patient Instructions (Signed)
DUE TO COVID-19 ONLY ONE VISITOR IS ALLOWED TO COME WITH YOU AND STAY IN THE WAITING ROOM ONLY DURING PRE OP AND PROCEDURE DAY OF SURGERY. THE 1 VISITOR MAY VISIT WITH YOU AFTER SURGERY IN YOUR PRIVATE ROOM DURING VISITING HOURS ONLY!  YOU NEED TO HAVE A COVID 19 TEST ON 12-21-19  @_______ , THIS TEST MUST BE DONE BEFORE SURGERY, COME  [place] , [id number].  ([place]) ONCE YOUR COVID TEST IS COMPLETED, PLEASE BEGIN THE QUARANTINE INSTRUCTIONS AS OUTLINED IN YOUR HANDOUT.                Carolyn [name]  12/16/2019   Your procedure is scheduled on: 12-25-19   Report to [facility] Main  Entrance    Report to admitting at 7:30 AM     Call this number if you have problems the morning of surgery [phone]    Remember: NO SOLID FOOD AFTER MIDNIGHT THE NIGHT PRIOR TO SURGERY. NOTHING BY MOUTH EXCEPT CLEAR LIQUIDS UNTIL 7:00 AM . PLEASE FINISH G2 DRINK PER SURGEON ORDER  WHICH NEEDS TO BE COMPLETED AT 7:00 AM .   CLEAR LIQUID DIET   Foods Allowed                                                                     Foods Excluded  Coffee and tea, regular and decaf                             liquids that you cannot  Plain Jell-O any favor except red or purple                                           see through such as: Fruit ices (not with fruit pulp)                                     milk, soups, orange juice  Iced Popsicles                                    All solid food Carbonated beverages, regular and diet                                    Cranberry, grape and apple juices Sports drinks like Gatorade Lightly seasoned clear broth or consume(fat free) Sugar, honey syrup   _____________________________________________________________________      Take these medicines the morning of surgery with A SIP OF WATER:  Citalopram (Celexa), and Omeprazole (Prilosec)  . BRUSH YOUR TEETH MORNING OF SURGERY AND RINSE YOUR MOUTH OUT, NO  CHEWING GUM CANDY OR MINTS.  DO NOT TAKE ANY DIABETIC MEDICATIONS DAY OF YOUR SURGERY  You may not have any metal on your body including hair pins and              piercings    Do not wear jewelry, make-up, lotions, powders or perfumes, deodorant              Do not wear nail polish on your fingernails.  Do not shave  48 hours prior to surgery.             Do not bring valuables to the hospital. [place].  Contacts, dentures or bridgework may not be worn into surgery.  You may bring an overnight bag     Special Instructions: N/A              Please read over the following fact sheets you were given: _____________________________________________________________________ How to Manage Your Diabetes Before and After Surgery  Why is it important to control my blood sugar before and after surgery? . Improving blood sugar levels before and after surgery helps healing and can limit problems. . A way of improving blood sugar control is eating a healthy diet by: o  Eating less sugar and carbohydrates o  Increasing activity/exercise o  Talking with your doctor about reaching your blood sugar goals . High blood sugars (greater than 180 mg/dL) can raise your risk of infections and slow your recovery, so you will need to focus on controlling your diabetes during the weeks before surgery. . Make sure that the doctor who takes care of your diabetes knows about your planned surgery including the date and location.  How do I manage my blood sugar before surgery? . Check your blood sugar at least 4 times a day, starting 2 days before surgery, to make sure that the level is not too high or low. o Check your blood sugar the morning of your surgery when you wake up and every 2 hours until you get to the Short Stay unit. . If your blood sugar is less than 70 mg/dL, you will need to treat for low blood sugar: o Do not take  insulin. o Treat a low blood sugar (less than 70 mg/dL) with  cup of clear juice (cranberry or apple), 4 glucose tablets, OR glucose gel. o Recheck blood sugar in 15 minutes after treatment (to make sure it is greater than 70 mg/dL). If your blood sugar is not greater than 70 mg/dL on recheck, call [phone] for further instructions. . Report your blood sugar to the short stay nurse when you get to Short Stay.  . If you are admitted to the hospital after surgery: o Your blood sugar will be checked by the staff and you will probably be given insulin after surgery (instead of oral diabetes medicines) to make sure you have good blood sugar levels. o The goal for blood sugar control after surgery is 80-180 mg/dL.   WHAT DO I DO ABOUT MY DIABETES MEDICATION?  Marland Kitchen Do not take oral diabetes medicines (pills) the morning of surgery.  . THE DAY BEFORE SURGERY, take your usual prescribed Metformin.          Reviewed and Endorsed by [facility] Patient Education Committee, August 2015            [facility] - Preparing for Surgery Before surgery, you can play an important role.  Because skin is not sterile, your skin needs to  be as free of germs as possible.  You can reduce the number of germs on your skin by washing with CHG (chlorahexidine gluconate) soap before surgery.  CHG is an antiseptic cleaner which kills germs and bonds with the skin to continue killing germs even after washing. Please DO NOT use if you have an allergy to CHG or antibacterial soaps.  If your skin becomes reddened/irritated stop using the CHG and inform your nurse when you arrive at Short Stay. Do not shave (including legs and underarms) for at least 48 hours prior to the first CHG shower.  You may shave your face/neck. Please follow these instructions carefully:  1.  Shower with CHG Soap the night before surgery and the  morning of Surgery.  2.  If you choose to wash your hair, wash your hair first as usual with your   normal  shampoo.  3.  After you shampoo, rinse your hair and body thoroughly to remove the  shampoo.                           4.  Use CHG as you would any other liquid soap.  You can apply chg directly  to the skin and wash                       Gently with a scrungie or clean washcloth.  5.  Apply the CHG Soap to your body ONLY FROM THE NECK DOWN.   Do not use on face/ open                           Wound or open sores. Avoid contact with eyes, ears mouth and genitals (private parts).                       Wash face,  Genitals (private parts) with your normal soap.             6.  Wash thoroughly, paying special attention to the area where your surgery  will be performed.  7.  Thoroughly rinse your body with warm water from the neck down.  8.  DO NOT shower/wash with your normal soap after using and rinsing off  the CHG Soap.                9.  Pat yourself dry with a clean towel.            10.  Wear clean pajamas.            11.  Place clean sheets on your bed the night of your first shower and do not  sleep with pets. Day of Surgery : Do not apply any lotions/deodorants the morning of surgery.  Please wear clean clothes to the hospital/surgery center.  FAILURE TO FOLLOW THESE INSTRUCTIONS MAY RESULT IN THE CANCELLATION OF YOUR SURGERY PATIENT SIGNATURE_________________________________  NURSE SIGNATURE__________________________________  ________________________________________________________________________   Carolyn [name]  An incentive spirometer is a tool that can help keep your lungs clear and active. This tool measures how well you are filling your lungs with each breath. Taking long deep breaths may help reverse or decrease the chance of developing breathing (pulmonary) problems (especially infection) following:  A long period of time when you are unable to move or be active. BEFORE THE PROCEDURE   If the spirometer includes an indicator to show your best  effort, your  nurse or respiratory therapist will set it to a desired goal.  If possible, sit up straight or lean slightly forward. Try not to slouch.  Hold the incentive spirometer in an upright position. INSTRUCTIONS FOR USE  1. Sit on the edge of your bed if possible, or sit up as far as you can in bed or on a chair. 2. Hold the incentive spirometer in an upright position. 3. Breathe out normally. 4. Place the mouthpiece in your mouth and seal your lips tightly around it. 5. Breathe in slowly and as deeply as possible, raising the piston or the ball toward the top of the column. 6. Hold your breath for 3-5 seconds or for as long as possible. Allow the piston or ball to fall to the bottom of the column. 7. Remove the mouthpiece from your mouth and breathe out normally. 8. Rest for a few seconds and repeat Steps 1 through 7 at least 10 times every 1-2 hours when you are awake. Take your time and take a few normal breaths between deep breaths. 9. The spirometer may include an indicator to show your best effort. Use the indicator as a goal to work toward during each repetition. 10. After each set of 10 deep breaths, practice coughing to be sure your lungs are clear. If you have an incision (the cut made at the time of surgery), support your incision when coughing by placing a pillow or rolled up towels firmly against it. Once you are able to get out of bed, walk around indoors and cough well. You may stop using the incentive spirometer when instructed by your caregiver.  RISKS AND COMPLICATIONS  Take your time so you do not get dizzy or light-headed.  If you are in pain, you may need to take or ask for pain medication before doing incentive spirometry. It is harder to take a deep breath if you are having pain. AFTER USE  Rest and breathe slowly and easily.  It can be helpful to keep track of a log of your progress. Your caregiver can provide you with a simple table to help with this. If you are using the  spirometer at home, follow these instructions: [place] IF:   You are having difficultly using the spirometer.  You have trouble using the spirometer as often as instructed.  Your pain medication is not giving enough relief while using the spirometer.  You develop fever of 100.5 F (38.1 C) or higher. SEEK IMMEDIATE MEDICAL CARE IF:   You cough up bloody sputum that had not been present before.  You develop fever of 102 F (38.9 C) or greater.  You develop worsening pain at or near the incision site. MAKE SURE YOU:   Understand these instructions.  Will watch your condition.  Will get help right away if you are not doing well or get worse. Document Released: 12/05/2006 Document Revised: 10/17/2011 Document Reviewed: 02/05/2007 Exit[employer] Patient Information 2014 Exit[employer], [place].   ________________________________________________________________________  WHAT IS A BLOOD TRANSFUSION? Blood Transfusion Information  A transfusion is the replacement of blood or some of its parts. Blood is made up of multiple cells which provide different functions.  Red blood cells carry oxygen and are used for blood loss replacement.  White blood cells fight against infection.  Platelets control bleeding.  Plasma helps clot blood.  Other blood products are available for specialized needs, such as hemophilia or other clotting disorders. BEFORE THE TRANSFUSION  Who gives blood for transfusions?  Healthy volunteers who are fully evaluated to make sure their blood is safe. This is blood bank blood. Transfusion therapy is the safest it has ever been in the practice of medicine. Before blood is taken from a donor, a complete history is taken to make sure that person has no history of diseases nor engages in risky social behavior (examples are intravenous drug use or sexual activity with multiple partners). The donor's travel history is screened to minimize risk of transmitting  infections, such as malaria. The donated blood is tested for signs of infectious diseases, such as HIV and hepatitis. The blood is then tested to be sure it is compatible with you in order to minimize the chance of a transfusion reaction. If you or a relative donates blood, this is often done in anticipation of surgery and is not appropriate for emergency situations. It takes many days to process the donated blood. RISKS AND COMPLICATIONS Although transfusion therapy is very safe and saves many lives, the main dangers of transfusion include:   Getting an infectious disease.  Developing a transfusion reaction. This is an allergic reaction to something in the blood you were given. Every precaution is taken to prevent this. The decision to have a blood transfusion has been considered carefully by your caregiver before blood is given. Blood is not given unless the benefits outweigh the risks. AFTER THE TRANSFUSION  Right after receiving a blood transfusion, you will usually feel much better and more energetic. This is especially true if your red blood cells have gotten low (anemic). The transfusion raises the level of the red blood cells which carry oxygen, and this usually causes an energy increase.  The nurse administering the transfusion will monitor you carefully for complications. HOME CARE INSTRUCTIONS  No special instructions are needed after a transfusion. You may find your energy is better. Speak with your caregiver about any limitations on activity for underlying diseases you may have. SEEK MEDICAL CARE IF:   Your condition is not improving after your transfusion.  You develop redness or irritation at the intravenous (IV) site. SEEK IMMEDIATE MEDICAL CARE IF:  Any of the following symptoms occur over the next 12 hours:  Shaking chills.  You have a temperature by mouth above 102 F (38.9 C), not controlled by medicine.  Chest, back, or muscle pain.  People around you feel you are  not acting correctly or are confused.  Shortness of breath or difficulty breathing.  Dizziness and fainting.  You get a rash or develop hives.  You have a decrease in urine output.  Your urine turns a dark color or changes to pink, red, or brown. Any of the following symptoms occur over the next 10 days:  You have a temperature by mouth above 102 F (38.9 C), not controlled by medicine.  Shortness of breath.  Weakness after normal activity.  The white part of the eye turns yellow (jaundice).  You have a decrease in the amount of urine or are urinating less often.  Your urine turns a dark color or changes to pink, red, or brown. Document Released: 07/22/2000 Document Revised: 10/17/2011 Document Reviewed: 03/10/2008 [facility] Patient Information 2014 [place], [place].  _______________________________________________________________________

## 2019-12-17 NOTE — Progress Notes (Signed)
DUE TO COVID-19 ONLY ONE VISITOR IS ALLOWED TO COME WITH YOU AND STAY IN THE WAITING ROOM ONLY DURING PRE OP AND PROCEDURE DAY OF SURGERY. THE 1 VISITOR MAY VISIT WITH YOU AFTER SURGERY IN YOUR PRIVATE ROOM DURING VISITING HOURS ONLY!  YOU NEED TO HAVE A COVID 19 TEST ON____5/15 ___ @_______ , THIS TEST MUST BE DONE BEFORE SURGERY, COME  [place], [place] [place] , [id number].  ([place]) ONCE YOUR COVID TEST IS COMPLETED, PLEASE BEGIN THE QUARANTINE INSTRUCTIONS AS OUTLINED IN YOUR HANDOUT.                Carolyn [name]  12/17/2019   Your procedure is scheduled on:  12/25/19  Report to [facility] Main  Entrance   Report to admitting at     0730 AM     Call this number if you have problems the morning of surgery [phone]    Remember: Do not eat food   :After Midnight. BRUSH YOUR TEETH MORNING OF SURGERY AND RINSE YOUR MOUTH OUT, NO CHEWING GUM CANDY OR MINTS.     Take these medicines the morning of surgery with A SIP OF WATER:  Celexa, Prilosec  DO NOT TAKE ANY DIABETIC MEDICATIONS DAY OF YOUR SURGERY                               You may not have any metal on your body including hair pins and              piercings  Do not wear jewelry, make-up, lotions, powders or perfumes, deodorant             Do not wear nail polish on your fingernails.  Do not shave  48 hours prior to surgery.               Do not bring valuables to the hospital. [place].  Contacts, dentures or bridgework may not be worn into surgery.  Leave suitcase in the car. After surgery it may be brought to your room.     Patients discharged the day of surgery will not be allowed to drive home. IF YOU ARE HAVING SURGERY AND GOING HOME THE SAME DAY, YOU MUST HAVE AN ADULT TO DRIVE YOU HOME AND BE WITH YOU FOR 24 HOURS. YOU MAY GO HOME BY TAXI OR UBER OR ORTHERWISE, BUT AN ADULT MUST ACCOMPANY YOU HOME AND STAY WITH YOU FOR 24 HOURS.  Name  and phone number of your driver:               Please read over the following fact sheets you were given: _____________________________________________________________________             NO SOLID FOOD AFTER MIDNIGHT THE NIGHT PRIOR TO SURGERY. NOTHING BY MOUTH EXCEPT CLEAR LIQUIDS UNTIL  0430am . PLEASE FINISH G2 DRINK PER SURGEON ORDER  WHICH NEEDS TO BE COMPLETED AT .0430am    CLEAR LIQUID DIET   Foods Allowed  Foods Excluded  Coffee and tea, regular and decaf                             liquids that you cannot  Plain Jell-O any favor except red or purple                                           see through such as: Fruit ices (not with fruit pulp)                                     milk, soups, orange juice  Iced Popsicles                                    All solid food Carbonated beverages, regular and diet                                    Cranberry, grape and apple juices Sports drinks like Gatorade Lightly seasoned clear broth or consume(fat free) Sugar, honey syrup  Sample Menu Breakfast                                Lunch                                     Supper Cranberry juice                    Beef broth                            Chicken broth Jell-O                                     Grape juice                           Apple juice Coffee or tea                        Jell-O                                      Popsicle                                                Coffee or tea                        Coffee or tea  _____________________________________________________________________  [facility] - Preparing for Surgery Before surgery, you can play an important role.  Because skin is not sterile, your skin  needs to be as free of germs as possible.  You can reduce the number of germs on your skin by washing with CHG (chlorahexidine gluconate) soap before surgery.  CHG is an antiseptic  cleaner which kills germs and bonds with the skin to continue killing germs even after washing. Please DO NOT use if you have an allergy to CHG or antibacterial soaps.  If your skin becomes reddened/irritated stop using the CHG and inform your nurse when you arrive at Short Stay. Do not shave (including legs and underarms) for at least 48 hours prior to the first CHG shower.  You may shave your face/neck. Please follow these instructions carefully:  1.  Shower with CHG Soap the night before surgery and the  morning of Surgery.  2.  If you choose to wash your hair, wash your hair first as usual with your  normal  shampoo.  3.  After you shampoo, rinse your hair and body thoroughly to remove the  shampoo.                           4.  Use CHG as you would any other liquid soap.  You can apply chg directly  to the skin and wash                       Gently with a scrungie or clean washcloth.  5.  Apply the CHG Soap to your body ONLY FROM THE NECK DOWN.   Do not use on face/ open                           Wound or open sores. Avoid contact with eyes, ears mouth and genitals (private parts).                       Wash face,  Genitals (private parts) with your normal soap.             6.  Wash thoroughly, paying special attention to the area where your surgery  will be performed.  7.  Thoroughly rinse your body with warm water from the neck down.  8.  DO NOT shower/wash with your normal soap after using and rinsing off  the CHG Soap.                9.  Pat yourself dry with a clean towel.            10.  Wear clean pajamas.            11.  Place clean sheets on your bed the night of your first shower and do not  sleep with pets. Day of Surgery : Do not apply any lotions/deodorants the morning of surgery.  Please wear clean clothes to the hospital/surgery center.  FAILURE TO FOLLOW THESE INSTRUCTIONS MAY RESULT IN THE CANCELLATION OF YOUR SURGERY PATIENT  SIGNATURE_________________________________  NURSE SIGNATURE__________________________________  ________________________________________________________________________   Carolyn [name]  An incentive spirometer is a tool that can help keep your lungs clear and active. This tool measures how well you are filling your lungs with each breath. Taking long deep breaths may help reverse or decrease the chance of developing breathing (pulmonary) problems (especially infection) following:  A long period of time when you are unable to move or be active. BEFORE THE PROCEDURE   If the spirometer includes an indicator to  show your best effort, your nurse or respiratory therapist will set it to a desired goal.  If possible, sit up straight or lean slightly forward. Try not to slouch.  Hold the incentive spirometer in an upright position. INSTRUCTIONS FOR USE  1. Sit on the edge of your bed if possible, or sit up as far as you can in bed or on a chair. 2. Hold the incentive spirometer in an upright position. 3. Breathe out normally. 4. Place the mouthpiece in your mouth and seal your lips tightly around it. 5. Breathe in slowly and as deeply as possible, raising the piston or the ball toward the top of the column. 6. Hold your breath for 3-5 seconds or for as long as possible. Allow the piston or ball to fall to the bottom of the column. 7. Remove the mouthpiece from your mouth and breathe out normally. 8. Rest for a few seconds and repeat Steps 1 through 7 at least 10 times every 1-2 hours when you are awake. Take your time and take a few normal breaths between deep breaths. 9. The spirometer may include an indicator to show your best effort. Use the indicator as a goal to work toward during each repetition. 10. After each set of 10 deep breaths, practice coughing to be sure your lungs are clear. If you have an incision (the cut made at the time of surgery), support your incision when coughing  by placing a pillow or rolled up towels firmly against it. Once you are able to get out of bed, walk around indoors and cough well. You may stop using the incentive spirometer when instructed by your caregiver.  RISKS AND COMPLICATIONS  Take your time so you do not get dizzy or light-headed.  If you are in pain, you may need to take or ask for pain medication before doing incentive spirometry. It is harder to take a deep breath if you are having pain. AFTER USE  Rest and breathe slowly and easily.  It can be helpful to keep track of a log of your progress. Your caregiver can provide you with a simple table to help with this. If you are using the spirometer at home, follow these instructions: [place] IF:   You are having difficultly using the spirometer.  You have trouble using the spirometer as often as instructed.  Your pain medication is not giving enough relief while using the spirometer.  You develop fever of 100.5 F (38.1 C) or higher. SEEK IMMEDIATE MEDICAL CARE IF:   You cough up bloody sputum that had not been present before.  You develop fever of 102 F (38.9 C) or greater.  You develop worsening pain at or near the incision site. MAKE SURE YOU:   Understand these instructions.  Will watch your condition.  Will get help right away if you are not doing well or get worse. Document Released: 12/05/2006 Document Revised: 10/17/2011 Document Reviewed: 02/05/2007 Exit[employer] Patient Information 2014 [employer], [place].   ________________________________________________________________________  WHAT IS A BLOOD TRANSFUSION? Blood Transfusion Information  A transfusion is the replacement of blood or some of its parts. Blood is made up of multiple cells which provide different functions.  Red blood cells carry oxygen and are used for blood loss replacement.  White blood cells fight against infection.  Platelets control bleeding.  Plasma helps clot  blood.  Other blood products are available for specialized needs, such as hemophilia or other clotting disorders. BEFORE THE TRANSFUSION  Who gives  blood for transfusions?   Healthy volunteers who are fully evaluated to make sure their blood is safe. This is blood bank blood. Transfusion therapy is the safest it has ever been in the practice of medicine. Before blood is taken from a donor, a complete history is taken to make sure that person has no history of diseases nor engages in risky social behavior (examples are intravenous drug use or sexual activity with multiple partners). The donor's travel history is screened to minimize risk of transmitting infections, such as malaria. The donated blood is tested for signs of infectious diseases, such as HIV and hepatitis. The blood is then tested to be sure it is compatible with you in order to minimize the chance of a transfusion reaction. If you or a relative donates blood, this is often done in anticipation of surgery and is not appropriate for emergency situations. It takes many days to process the donated blood. RISKS AND COMPLICATIONS Although transfusion therapy is very safe and saves many lives, the main dangers of transfusion include:   Getting an infectious disease.  Developing a transfusion reaction. This is an allergic reaction to something in the blood you were given. Every precaution is taken to prevent this. The decision to have a blood transfusion has been considered carefully by your caregiver before blood is given. Blood is not given unless the benefits outweigh the risks. AFTER THE TRANSFUSION  Right after receiving a blood transfusion, you will usually feel much better and more energetic. This is especially true if your red blood cells have gotten low (anemic). The transfusion raises the level of the red blood cells which carry oxygen, and this usually causes an energy increase.  The nurse administering the transfusion will monitor  you carefully for complications. HOME CARE INSTRUCTIONS  No special instructions are needed after a transfusion. You may find your energy is better. Speak with your caregiver about any limitations on activity for underlying diseases you may have. SEEK MEDICAL CARE IF:   Your condition is not improving after your transfusion.  You develop redness or irritation at the intravenous (IV) site. SEEK IMMEDIATE MEDICAL CARE IF:  Any of the following symptoms occur over the next 12 hours:  Shaking chills.  You have a temperature by mouth above 102 F (38.9 C), not controlled by medicine.  Chest, back, or muscle pain.  People around you feel you are not acting correctly or are confused.  Shortness of breath or difficulty breathing.  Dizziness and fainting.  You get a rash or develop hives.  You have a decrease in urine output.  Your urine turns a dark color or changes to pink, red, or brown. Any of the following symptoms occur over the next 10 days:  You have a temperature by mouth above 102 F (38.9 C), not controlled by medicine.  Shortness of breath.  Weakness after normal activity.  The white part of the eye turns yellow (jaundice).  You have a decrease in the amount of urine or are urinating less often.  Your urine turns a dark color or changes to pink, red, or brown. Document Released: 07/22/2000 Document Revised: 10/17/2011 Document Reviewed: 03/10/2008 [facility] Patient Information 2014 [place], [place].  _______________________________________________________________________

## 2019-12-18 ENCOUNTER — Encounter (HOSPITAL_COMMUNITY)
Admission: RE | Admit: 2019-12-18 | Discharge: 2019-12-18 | Disposition: A | Discharge status: Home / Self Care | Payer: Medicare Other | Source: Ambulatory Visit | Attending: Orthopedic Surgery | Admitting: Orthopedic Surgery

## 2019-12-18 ENCOUNTER — Other Ambulatory Visit: Payer: Self-pay

## 2019-12-18 ENCOUNTER — Ambulatory Visit (HOSPITAL_BASED_OUTPATIENT_CLINIC_OR_DEPARTMENT_OTHER): Payer: Medicare Other

## 2019-12-18 ENCOUNTER — Encounter (HOSPITAL_COMMUNITY): Payer: Self-pay

## 2019-12-18 ENCOUNTER — Encounter (HOSPITAL_COMMUNITY)
Admission: RE | Admit: 2019-12-18 | Discharge: 2019-12-18 | Disposition: A | Discharge status: Home / Self Care | Payer: Medicare Other | Source: Ambulatory Visit | Attending: Internal Medicine | Admitting: Internal Medicine

## 2019-12-18 ENCOUNTER — Encounter (HOSPITAL_COMMUNITY)
Admission: RE | Admit: 2019-12-18 | Discharge: 2019-12-18 | Disposition: A | Discharge status: Home / Self Care | Payer: Medicare Other | Source: Ambulatory Visit | Attending: Neurology | Admitting: Neurology

## 2019-12-18 ENCOUNTER — Telehealth: Payer: Self-pay | Admitting: Neurology

## 2019-12-18 DIAGNOSIS — N189 Chronic kidney disease, unspecified: Secondary | ICD-10-CM | POA: Insufficient documentation

## 2019-12-18 DIAGNOSIS — R419 Unspecified symptoms and signs involving cognitive functions and awareness: Secondary | ICD-10-CM | POA: Diagnosis not present

## 2019-12-18 DIAGNOSIS — I253 Aneurysm of heart: Secondary | ICD-10-CM | POA: Diagnosis not present

## 2019-12-18 DIAGNOSIS — Q2112 Patent foramen ovale: Secondary | ICD-10-CM

## 2019-12-18 DIAGNOSIS — Q211 Atrial septal defect: Secondary | ICD-10-CM | POA: Insufficient documentation

## 2019-12-18 DIAGNOSIS — E1122 Type 2 diabetes mellitus with diabetic chronic kidney disease: Secondary | ICD-10-CM | POA: Insufficient documentation

## 2019-12-18 DIAGNOSIS — R4020 Unspecified coma: Secondary | ICD-10-CM

## 2019-12-18 DIAGNOSIS — E785 Hyperlipidemia, unspecified: Secondary | ICD-10-CM | POA: Insufficient documentation

## 2019-12-18 DIAGNOSIS — R55 Syncope and collapse: Secondary | ICD-10-CM | POA: Diagnosis not present

## 2019-12-18 DIAGNOSIS — Z01818 Encounter for other preprocedural examination: Secondary | ICD-10-CM | POA: Insufficient documentation

## 2019-12-18 DIAGNOSIS — I129 Hypertensive chronic kidney disease with stage 1 through stage 4 chronic kidney disease, or unspecified chronic kidney disease: Secondary | ICD-10-CM | POA: Diagnosis not present

## 2019-12-18 HISTORY — DX: Syncope and collapse: R55

## 2019-12-18 LAB — COMPREHENSIVE METABOLIC PANEL
ALT: 14 U/L (ref 0–44)
AST: 18 U/L (ref 15–41)
Albumin: 4.4 g/dL (ref 3.5–5.0)
Alkaline Phosphatase: 58 U/L (ref 38–126)
Anion gap: 10 (ref 5–15)
BUN: 15 mg/dL (ref 8–23)
CO2: 28 mmol/L (ref 22–32)
Calcium: 10 mg/dL (ref 8.9–10.3)
Chloride: 96 mmol/L — ABNORMAL LOW (ref 98–111)
Creatinine, Ser: 0.93 mg/dL (ref 0.44–1.00)
GFR calc Af Amer: >60 mL/min (ref >60)
GFR calc non Af Amer: 58 mL/min — ABNORMAL LOW (ref >60)
Glucose, Bld: 171 mg/dL — ABNORMAL HIGH (ref 70–99)
Potassium: 3.5 mmol/L (ref 3.5–5.1)
Sodium: 134 mmol/L — ABNORMAL LOW (ref 135–145)
Total Bilirubin: 0.7 mg/dL (ref 0.3–1.2)
Total Protein: 7.3 g/dL (ref 6.5–8.1)

## 2019-12-18 LAB — HEMOGLOBIN A1C
Hgb A1c MFr Bld: 6.1 % — ABNORMAL HIGH (ref 4.8–5.6)
Mean Plasma Glucose: 128.37 mg/dL

## 2019-12-18 LAB — SURGICAL PCR SCREEN
MRSA, PCR: NEGATIVE
Staphylococcus aureus: NEGATIVE

## 2019-12-18 LAB — CBC
HCT: 37.4 % (ref 36.0–46.0)
Hemoglobin: 12.4 g/dL (ref 12.0–15.0)
MCH: 31.3 pg (ref 26.0–34.0)
MCHC: 33.2 g/dL (ref 30.0–36.0)
MCV: 94.4 fL (ref 80.0–100.0)
Platelets: 277 10*3/uL (ref 150–400)
RBC: 3.96 MIL/uL (ref 3.87–5.11)
RDW: 13.2 % (ref 11.5–15.5)
WBC: 5 10*3/uL (ref 4.0–10.5)
nRBC: 0 % (ref 0.0–0.2)

## 2019-12-18 LAB — PROTIME-INR
INR: 1 (ref 0.8–1.2)
Prothrombin Time: 12.7 seconds (ref 11.4–15.2)

## 2019-12-18 LAB — APTT: aPTT: 28 seconds (ref 24–36)

## 2019-12-18 LAB — GLUCOSE, CAPILLARY: Glucose-Capillary: 162 mg/dL — ABNORMAL HIGH (ref 70–99)

## 2019-12-18 MED ORDER — SODIUM CHLORIDE 0.9% FLUSH: 16.0000 mL | INTRAVENOUS | Status: AC | PRN

## 2019-12-18 NOTE — Telephone Encounter (Signed)
I reviewed results from patient's CTA of the head and neck which showed no significant stenosis or atherosclerosis or aneurysms, looked very good for age thanks

## 2019-12-18 NOTE — Patient Instructions (Addendum)
DUE TO COVID-19 ONLY ONE VISITOR IS ALLOWED TO COME WITH YOU AND STAY IN THE WAITING ROOM ONLY DURING PRE OP AND PROCEDURE DAY OF SURGERY. Two  VISITOR MAY VISIT WITH YOU AFTER SURGERY IN YOUR PRIVATE ROOM DURING VISITING HOURS ONLY!  YOU NEED TO HAVE A COVID 19 TEST ON____5/15 ___ @___1130____ , THIS TEST MUST BE DONE BEFORE SURGERY, COME  [place], [place] , [address].  ([place]) ONCE YOUR COVID TEST IS COMPLETED, PLEASE BEGIN THE QUARANTINE INSTRUCTIONS AS OUTLINED IN YOUR HANDOUT.                Carolyn [name]  12/18/2019   Your procedure is scheduled on: 12-25-19   Report to [facility] Main  Entrance   Report to admitting at     0730 AM     Call this number if you have problems the morning of surgery [phone]    Remember: Do not eat food   :After Midnight. BRUSH YOUR TEETH MORNING OF SURGERY AND RINSE YOUR MOUTH OUT, NO CHEWING GUM CANDY OR MINTS.     Take these medicines the morning of surgery with A SIP OF WATER:                                                                                                                                Celexa, Prilosec  DO NOT TAKE ANY DIABETIC MEDICATIONS DAY OF YOUR SURGERY                               You may not have any metal on your body including hair pins and              piercings  Do not wear jewelry, make-up, lotions, powders or perfumes, deodorant             Do not wear nail polish on your fingernails.  Do not shave  48 hours prior to surgery.               Do not bring valuables to the hospital. [place].  Contacts, dentures or bridgework may not be worn into surgery.  Leave suitcase in the car. After surgery it may be brought to your room.     Patients discharged the day of surgery will not be allowed to drive home. IF YOU ARE HAVING SURGERY AND GOING HOME THE SAME DAY, YOU MUST HAVE AN ADULT TO DRIVE YOU HOME AND BE WITH YOU FOR 24  HOURS. YOU MAY GO HOME BY TAXI OR UBER OR ORTHERWISE, BUT AN ADULT MUST ACCOMPANY YOU HOME AND STAY WITH YOU FOR 24 HOURS.  Name and phone number of your driver:  Please read over the following fact sheets you were given: _____________________________________________________________________             NO SOLID FOOD AFTER MIDNIGHT THE NIGHT PRIOR TO SURGERY. NOTHING BY MOUTH EXCEPT CLEAR LIQUIDS UNTIL  0700 am then nothing by mouth . PLEASE FINISH G2 DRINK PER SURGEON ORDER  WHICH NEEDS TO BE COMPLETED AT .0700 am     CLEAR LIQUID DIET   Foods Allowed                                                                                    Foods Excluded  Coffee and tea, regular and decaf  No creamer                            liquids that you cannot  Plain Jell-O any favor except red or purple                                           see through such as: Fruit ices (not with fruit pulp)                                                           milk, soups, orange juice  Iced Popsicles                                                         All solid food Carbonated beverages, regular and diet                                    Cranberry, grape and apple juices Sports drinks like Gatorade Lightly seasoned clear broth or consume(fat free) Sugar, honey syrup  _____________________________________________________________________  [facility] - Preparing for Surgery Before surgery, you can play an important role.  Because skin is not sterile, your skin needs to be as free of germs as possible.  You can reduce the number of germs on your skin by washing with CHG (chlorahexidine gluconate) soap before surgery.  CHG is an antiseptic cleaner which kills germs and bonds with the skin to continue killing germs even after washing. Please DO NOT use if you have an allergy to CHG or antibacterial soaps.  If your skin becomes reddened/irritated stop using the CHG and inform your nurse when  you arrive at Short Stay. Do not shave (including legs and underarms) for at least 48 hours prior to the first CHG shower.  You may shave your face/neck. Please follow these instructions carefully:  1.  Shower with CHG Soap the night before surgery and the  morning of  Surgery.  2.  If you choose to wash your hair, wash your hair first as usual with your  normal  shampoo.  3.  After you shampoo, rinse your hair and body thoroughly to remove the  shampoo.                           4.  Use CHG as you would any other liquid soap.  You can apply chg directly  to the skin and wash                       Gently with a scrungie or clean washcloth.  5.  Apply the CHG Soap to your body ONLY FROM THE NECK DOWN.   Do not use on face/ open                           Wound or open sores. Avoid contact with eyes, ears mouth and genitals (private parts).                       Wash face,  Genitals (private parts) with your normal soap.             6.  Wash thoroughly, paying special attention to the area where your surgery  will be performed.  7.  Thoroughly rinse your body with warm water from the neck down.  8.  DO NOT shower/wash with your normal soap after using and rinsing off  the CHG Soap.                9.  Pat yourself dry with a clean towel.            10.  Wear clean pajamas.            11.  Place clean sheets on your bed the night of your first shower and do not  sleep with pets. Day of Surgery : Do not apply any lotions/deodorants the morning of surgery.  Please wear clean clothes to the hospital/surgery center.  FAILURE TO FOLLOW THESE INSTRUCTIONS MAY RESULT IN THE CANCELLATION OF YOUR SURGERY PATIENT SIGNATURE_________________________________  NURSE SIGNATURE__________________________________  ________________________________________________________________________   Carolyn [name]  An incentive spirometer is a tool that can help keep your lungs clear and active. This tool measures  how well you are filling your lungs with each breath. Taking long deep breaths may help reverse or decrease the chance of developing breathing (pulmonary) problems (especially infection) following:  A long period of time when you are unable to move or be active. BEFORE THE PROCEDURE   If the spirometer includes an indicator to show your best effort, your nurse or respiratory therapist will set it to a desired goal.  If possible, sit up straight or lean slightly forward. Try not to slouch.  Hold the incentive spirometer in an upright position. INSTRUCTIONS FOR USE  1. Sit on the edge of your bed if possible, or sit up as far as you can in bed or on a chair. 2. Hold the incentive spirometer in an upright position. 3. Breathe out normally. 4. Place the mouthpiece in your mouth and seal your lips tightly around it. 5. Breathe in slowly and as deeply as possible, raising the piston or the ball toward the top of the column. 6. Hold your breath for 3-5 seconds or for as long as possible. Allow the  piston or ball to fall to the bottom of the column. 7. Remove the mouthpiece from your mouth and breathe out normally. 8. Rest for a few seconds and repeat Steps 1 through 7 at least 10 times every 1-2 hours when you are awake. Take your time and take a few normal breaths between deep breaths. 9. The spirometer may include an indicator to show your best effort. Use the indicator as a goal to work toward during each repetition. 10. After each set of 10 deep breaths, practice coughing to be sure your lungs are clear. If you have an incision (the cut made at the time of surgery), support your incision when coughing by placing a pillow or rolled up towels firmly against it. Once you are able to get out of bed, walk around indoors and cough well. You may stop using the incentive spirometer when instructed by your caregiver.  RISKS AND COMPLICATIONS  Take your time so you do not get dizzy or light-headed.  If  you are in pain, you may need to take or ask for pain medication before doing incentive spirometry. It is harder to take a deep breath if you are having pain. AFTER USE  Rest and breathe slowly and easily.  It can be helpful to keep track of a log of your progress. Your caregiver can provide you with a simple table to help with this. If you are using the spirometer at home, follow these instructions: [place] IF:   You are having difficultly using the spirometer.  You have trouble using the spirometer as often as instructed.  Your pain medication is not giving enough relief while using the spirometer.  You develop fever of 100.5 F (38.1 C) or higher. SEEK IMMEDIATE MEDICAL CARE IF:   You cough up bloody sputum that had not been present before.  You develop fever of 102 F (38.9 C) or greater.  You develop worsening pain at or near the incision site. MAKE SURE YOU:   Understand these instructions.  Will watch your condition.  Will get help right away if you are not doing well or get worse. Document Released: 12/05/2006 Document Revised: 10/17/2011 Document Reviewed: 02/05/2007 ExitCare Patient Information 2014 ExitCare, [place].   ________________________________________________________________________  WHAT IS A BLOOD TRANSFUSION? Blood Transfusion Information  A transfusion is the replacement of blood or some of its parts. Blood is made up of multiple cells which provide different functions.  Red blood cells carry oxygen and are used for blood loss replacement.  White blood cells fight against infection.  Platelets control bleeding.  Plasma helps clot blood.  Other blood products are available for specialized needs, such as hemophilia or other clotting disorders. BEFORE THE TRANSFUSION  Who gives blood for transfusions?   Healthy volunteers who are fully evaluated to make sure their blood is safe. This is blood bank blood. Transfusion therapy is the  safest it has ever been in the practice of medicine. Before blood is taken from a donor, a complete history is taken to make sure that person has no history of diseases nor engages in risky social behavior (examples are intravenous drug use or sexual activity with multiple partners). The donor's travel history is screened to minimize risk of transmitting infections, such as malaria. The donated blood is tested for signs of infectious diseases, such as HIV and hepatitis. The blood is then tested to be sure it is compatible with you in order to minimize the chance of a transfusion reaction. If  you or a relative donates blood, this is often done in anticipation of surgery and is not appropriate for emergency situations. It takes many days to process the donated blood. RISKS AND COMPLICATIONS Although transfusion therapy is very safe and saves many lives, the main dangers of transfusion include:   Getting an infectious disease.  Developing a transfusion reaction. This is an allergic reaction to something in the blood you were given. Every precaution is taken to prevent this. The decision to have a blood transfusion has been considered carefully by your caregiver before blood is given. Blood is not given unless the benefits outweigh the risks. AFTER THE TRANSFUSION  Right after receiving a blood transfusion, you will usually feel much better and more energetic. This is especially true if your red blood cells have gotten low (anemic). The transfusion raises the level of the red blood cells which carry oxygen, and this usually causes an energy increase.  The nurse administering the transfusion will monitor you carefully for complications. HOME CARE INSTRUCTIONS  No special instructions are needed after a transfusion. You may find your energy is better. Speak with your caregiver about any limitations on activity for underlying diseases you may have. SEEK MEDICAL CARE IF:   Your condition is not improving  after your transfusion.  You develop redness or irritation at the intravenous (IV) site. SEEK IMMEDIATE MEDICAL CARE IF:  Any of the following symptoms occur over the next 12 hours:  Shaking chills.  You have a temperature by mouth above 102 F (38.9 C), not controlled by medicine.  Chest, back, or muscle pain.  People around you feel you are not acting correctly or are confused.  Shortness of breath or difficulty breathing.  Dizziness and fainting.  You get a rash or develop hives.  You have a decrease in urine output.  Your urine turns a dark color or changes to pink, red, or brown. Any of the following symptoms occur over the next 10 days:  You have a temperature by mouth above 102 F (38.9 C), not controlled by medicine.  Shortness of breath.  Weakness after normal activity.  The white part of the eye turns yellow (jaundice).  You have a decrease in the amount of urine or are urinating less often.  Your urine turns a dark color or changes to pink, red, or brown. Document Released: 07/22/2000 Document Revised: 10/17/2011 Document Reviewed: 03/10/2008 [facility] Patient Information 2014 [place], [place].  _______________________________________________________________________

## 2019-12-18 NOTE — H&P (Signed)
TOTAL HIP ADMISSION H&P  Patient is admitted for right total hip arthroplasty.  Subjective:  Chief Complaint: Right hip pain  HPI: Carolyn [name], 79 y.o. female, has a history of pain and functional disability in the right hip due to arthritis and patient has failed non-surgical conservative treatments for greater than 12 weeks to include corticosteriod injections and activity modification. Onset of symptoms was gradual, starting several years ago with gradually worsening course since that time. The patient noted no past surgery on the right hip. Patient currently rates pain in the right hip at 8 out of 10 with activity. Patient has worsening of pain with activity and weight bearing, pain that interfers with activities of daily living and crepitus. Patient has evidence of end-stage arthritis of the right hip, bone-on-bone throughout by imaging studies. This condition presents safety issues increasing the risk of falls. There is no current active infection.  Patient Active Problem List   Diagnosis Date Noted  . Syncope and collapse 11/26/2019  . Abdominal bloating 08/22/2019  . Lower abdominal pain 08/22/2019  . OA (osteoarthritis) of knee 05/07/2018  . Hyperparathyroidism, primary ([place]) 04/25/2017  . Falls 07/19/2016  . Precordial chest pain 12/23/2014  . Hypercalcemia 12/23/2014  . Diabetes ([place]) 10/23/2013  . HTN (hypertension) 10/23/2013    Past Medical History:  Diagnosis Date  . Anxiety   . Arthritis    RA  . CKD (chronic kidney disease)    pt. denies  . Complication of anesthesia    " my heart rate gets low"  . Depression   . Depression   . Diabetes mellitus ([place])    type 2  . GERD (gastroesophageal reflux disease)   . Hepatic steatosis   . Hyperlipidemia   . Hyperparathyroidism, primary ([place])   . Hypertension   . IBS (irritable bowel syndrome)   . Internal hemorrhoid   . OA (osteoarthritis)   . Syncope   . Tubular adenoma of colon 1999   colonoscopy  . Wears  dentures   . Wears glasses     Past Surgical History:  Procedure Laterality Date  . ANKLE FRACTURE SURGERY Left yrs ago  . BACK SURGERY  04/2011   lower fpr spinal stenosis and ruptured disc  . COLONOSCOPY W/ BIOPSIES AND POLYPECTOMY    . LAMINECTOMY  2009   Dr. Gladstone Lighter  . LUMBAR DISC SURGERY  04/13/2011   L3, L4 Dr. Amil Amen  . PARATHYROIDECTOMY Right 04/27/2017   Procedure: RIGHT PARATHYROIDECTOMY;  Surgeon: Armandina Gemma, MD;  Location: [facility];  Service: General;  Laterality: Right;  . TONSILLECTOMY  as child   and adenoids  . TOTAL KNEE ARTHROPLASTY Left 05/07/2018   Procedure: LEFT TOTAL KNEE ARTHROPLASTY;  Surgeon: Gaynelle Arabian, MD;  Location: [facility];  Service: Orthopedics;  Laterality: Left;  50 mins  . VAGINAL HYSTERECTOMY  1964   partial  . vein stripping R leg      Prior to Admission medications   Medication Sig Start Date End Date Taking? Authorizing Provider  Ascorbic Acid (VITAMIN C) 1000 MG tablet Take 1,000 mg by mouth daily in the afternoon.   Yes [provider]  Cholecalciferol (VITAMIN D-3) 125 MCG (5000 UT) TABS Take 5,000 Units by mouth daily.   Yes [provider]  citalopram (CELEXA) 20 MG tablet Take 20 mg by mouth daily with breakfast.  01/05/12  Yes [provider]  folic acid (FOLVITE) 1 MG tablet Take 1 mg by mouth daily.   Yes [provider]  gabapentin (NEURONTIN) 100 MG capsule Take 100 mg by mouth at bedtime. 12/04/19  Yes [provider]  hydrocortisone (ANUSOL-HC) 2.5 % rectal cream Place 1 application rectally as needed for hemorrhoids or anal itching.   Yes [provider]  Hydrocortisone Ace-Pramoxine (PROCTOCREAM-HC RE) Place 1 application rectally 3 (three) times daily as needed (hemorrhoids/irritation.).    Yes [provider]  Liniments (SALONPAS PAIN RELIEF PATCH EX) Place 1 patch onto the skin daily as needed (pain).    Yes [provider]  losartan-hydrochlorothiazide  (HYZAAR) 100-12.5 MG per tablet Take 1 tablet by mouth daily with breakfast.  12/16/11  Yes [provider]  metFORMIN (GLUCOPHAGE-XR) 500 MG 24 hr tablet Take 1,000 mg by mouth every evening.  11/23/11  Yes [provider]  methotrexate (RHEUMATREX) 2.5 MG tablet Take 20 mg by mouth every Sunday. Caution:Chemotherapy. Protect from light.  Take 8 pills once a week   Yes [provider]  omeprazole (PRILOSEC) 20 MG capsule Take 20 mg by mouth daily before breakfast.  11/12/11  Yes [provider]  traMADol (ULTRAM) 50 MG tablet Take 1 tablet (50 mg total) by mouth every 8 (eight) hours as needed for severe pain. 11/10/19  Yes Hayden Rasmussen, MD  Zinc 50 MG TABS Take 50 mg by mouth daily in the afternoon.   Yes [provider]    Allergies  Allergen Reactions  . Ace Inhibitors     cough  . Statins Other (See Comments)    Muscle pain/myalgias  . Zetia [Ezetimibe]     headache  . Sulfa Antibiotics Rash    Social History   Socioeconomic History  . Marital status: Widowed    Spouse name: Not on file  . Number of children: 3  . Years of education: 10  . Highest education level: Not on file  Occupational History  . Occupation: retired  Tobacco Use  . Smoking status: Never Smoker  . Smokeless tobacco: Never Used  Substance and Sexual Activity  . Alcohol use: Never  . Drug use: Never  . Sexual activity: Not Currently  Other Topics Concern  . Not on file  Social History Narrative   Lives alone   Drinks caffeine free diet soda daily   Left-handed   Social Determinants of Health   Financial Resource Strain:   . Difficulty of Paying Living Expenses:   Food Insecurity:   . Worried About Charity fundraiser in the Last Year:   . Arboriculturist in the Last Year:   Transportation Needs:   . Film/video editor (Medical):   Marland Kitchen Lack of Transportation (Non-Medical):   Physical Activity:   . Days of Exercise per Week:   . Minutes of  Exercise per Session:   Stress:   . Feeling of Stress :   Social Connections:   . Frequency of Communication with Friends and Family:   . Frequency of Social Gatherings with Friends and Family:   . Attends Religious Services:   . Active Member of Clubs or Organizations:   . Attends Archivist Meetings:   Marland Kitchen Marital Status:   Intimate Partner Violence:   . Fear of Current or Ex-Partner:   . Emotionally Abused:   Marland Kitchen Physically Abused:   . Sexually Abused:       Tobacco Use: Low Risk   . Smoking Tobacco Use: Never Smoker  . Smokeless Tobacco Use: Never Used   Social History   Substance and Sexual  Activity  Alcohol Use Never    Family History  Problem Relation Age of Onset  . Stomach cancer Sister   . Diabetes Sister   . Colon polyps Sister        hyperplastic  . High blood pressure Sister   . Alzheimer's disease Mother   . Suicidality Father   . Colon cancer Neg Hx   . Breast cancer Neg Hx     Review of Systems  Constitutional: Negative for chills and fever.  HENT: Negative for congestion, sore throat and tinnitus.   Eyes: Negative for double vision, photophobia and pain.  Respiratory: Negative for cough, shortness of breath and wheezing.   Cardiovascular: Negative for chest pain, palpitations and orthopnea.  Gastrointestinal: Negative for heartburn, nausea and vomiting.  Genitourinary: Negative for dysuria, frequency and urgency.  Musculoskeletal: Positive for joint pain.  Neurological: Negative for dizziness, weakness and headaches.     Objective:  Physical Exam: Well nourished and well developed. General: Alert and oriented x3, cooperative and pleasant, no acute distress. Head: normocephalic, atraumatic, neck supple. Eyes: EOMI. Respiratory: breath sounds clear in all fields, no wheezing, rales, or rhonchi. Cardiovascular: Regular rate and rhythm, no murmurs, gallops or rubs.  Abdomen: non-tender to palpation and soft, normoactive bowel  sounds.  Musculoskeletal: Right hip exam: Range of motion of the right hip is decreased with pain in the groin on internal/external rotation and flexion beyond 95. Sensation and circulation otherwise are intact.  Calves soft and nontender. Motor function intact in LE. Strength 5/5 LE bilaterally. Neuro: Distal pulses 2+. Sensation to light touch intact in LE.  Vital signs in last 24 hours: Temp:  [98.4 F (36.9 C)] 98.4 F (36.9 C) (05/12 1125) Pulse Rate:  [84] 84 (05/12 1125) Resp:  [16] 16 (05/12 1125) SpO2:  [98 %] 98 % (05/12 1125)  Imaging Review Plain radiographs demonstrate severe degenerative joint disease of the right hip. The bone quality appears to be adequate for age and reported activity level.  Assessment/Plan:  End stage arthritis, right hip  The patient history, physical examination, clinical judgement of the provider and imaging studies are consistent with end stage degenerative joint disease of the right hip and total hip arthroplasty is deemed medically necessary. The treatment options including medical management, injection therapy, arthroscopy and arthroplasty were discussed at length. The risks and benefits of total hip arthroplasty were presented and reviewed. The risks due to aseptic loosening, infection, stiffness, dislocation/subluxation, thromboembolic complications and other imponderables were discussed. The patient acknowledged the explanation, agreed to proceed with the plan and consent was signed. Patient is being admitted for inpatient treatment for surgery, pain control, PT, OT, prophylactic antibiotics, VTE prophylaxis, progressive ambulation and ADLs and discharge planning.The patient is planning to be discharged home.   Patient's anticipated LOS is less than 2 midnights, meeting these requirements: - Younger than 61 - Lives within 1 hour of care - Has a competent adult at home to recover with post-op recover - NO history of  - Chronic pain  requiring opiods  - Diabetes  - Coronary Artery Disease  - Heart failure  - Heart attack  - Stroke  - DVT/VTE  - Cardiac arrhythmia  - Respiratory Failure/COPD  - Renal failure  - Anemia  - Advanced Liver disease  Therapy Plans: HEP Disposition: Home with family Planned DVT Prophylaxis: aspirin 325mg  BID DME needed: none PCP: Dr. Gar Ponto, clearance received  Rheumatologist: Dr. Annamaria Boots  TXA: IV Allergies: Sulfa drugs - rash Anesthesia Concerns: none  BMI: 26.1 Last HgbA1c: 6.2%  Other: Hx of RA, takes methotrexate, will stop 2 weeks before per Rheumatologist.   - Patient was instructed on what medications to stop prior to surgery. - Follow-up visit in 2 weeks with Dr. Wynelle Link - Begin physical therapy following surgery - Pre-operative lab work as pre-surgical testing - Prescriptions will be provided in hospital at time of discharge  Theresa Duty, PA-C Orthopedic Surgery [facility] [place]

## 2019-12-18 NOTE — Progress Notes (Signed)
Anesthesia Addendum:  Patient had syncope episode and collapse on 11/10/19.  In Ed.  Ct head was done EEG done 11/28/19.  11/26/19 Office visit with Neuro .  Echo Bubble study done 5/12 /21 with results in epic. Neurologist- DR Jaynee Eagles.

## 2019-12-19 NOTE — Telephone Encounter (Signed)
Pt has called [place],RN back please call 

## 2019-12-19 NOTE — Addendum Note (Signed)
Addended by: Gildardo Griffes on: 12/19/2019 04:42 PM   Modules accepted: Orders

## 2019-12-19 NOTE — Telephone Encounter (Addendum)
Echocardiogram complete bubble study.  Carolyn Beam, MD  12/19/2019 8:57 AM EDT    A patent foramen ovale was detected. 25% of the population have this however given that her incident may have been a TIA then this warrants further investigation. I would like to send her to cardiology if she is willing. thanks

## 2019-12-19 NOTE — Telephone Encounter (Signed)
Referral ordered for Dr. Sherren Mocha at [facility] office for PFO.

## 2019-12-19 NOTE — Telephone Encounter (Signed)
Called pt and discussed results of CTA head and neck and the echocardiogram. Pt verbalized understanding of the results. She is amenable to a cardiology consult for the PFO. She has surgery neck week and is aware that the referral will be sent and then the cardiologist office will call her to schedule a consultation.

## 2019-12-19 NOTE — Telephone Encounter (Signed)
I called the pt & LVM asking for call back. Left office number and hours in message.

## 2019-12-19 NOTE — Telephone Encounter (Signed)
Carolyn [name]

## 2019-12-20 ENCOUNTER — Encounter (HOSPITAL_COMMUNITY): Payer: Self-pay | Admitting: Physician Assistant

## 2019-12-21 ENCOUNTER — Other Ambulatory Visit (HOSPITAL_COMMUNITY)
Admission: RE | Admit: 2019-12-21 | Discharge: 2019-12-21 | Disposition: A | Discharge status: Home / Self Care | Payer: Medicare Other | Source: Ambulatory Visit | Attending: Orthopedic Surgery | Admitting: Orthopedic Surgery

## 2019-12-21 DIAGNOSIS — Z01812 Encounter for preprocedural laboratory examination: Secondary | ICD-10-CM | POA: Diagnosis not present

## 2019-12-21 DIAGNOSIS — Z20822 Contact with and (suspected) exposure to covid-19: Secondary | ICD-10-CM | POA: Diagnosis not present

## 2019-12-21 LAB — SARS CORONAVIRUS 2 (TAT 6-24 HRS): SARS Coronavirus 2: NEGATIVE

## 2019-12-23 ENCOUNTER — Other Ambulatory Visit: Payer: Medicare Other

## 2019-12-24 ENCOUNTER — Telehealth: Payer: Self-pay | Admitting: Neurology

## 2019-12-24 NOTE — Telephone Encounter (Signed)
I spoke with Dr. Peri Maris office and I advised that I cannot approve her for hip surgery because we are in the middle of the stroke or seizure work-up.  Please give patient a call and impressed on her that we ordered MRI of the brain and CT angio of the head and neck and we have to get this completed before I can approve her to get her hip surgery; it does not appear as though she has scheduled these.  For example, if we did see on MRI that she had a stroke for example we do not recommend surgery for at least 3 months, and with the new finding of possibly a large patent foramen ovale she has to see cardiology first as well.

## 2019-12-24 NOTE — Telephone Encounter (Signed)
Spoke with pt. Pt upset because surgery cannot be done until workup is complete. Pt states she had the vessels in her neck looked at and she said she had an MRI. She said she went to [facility] and that we should have the records. I let pt know there was no mention of that in the office note and that the records would have to be obtained if so. The orders for the MRI brain and CT head and neck all went to GI. Pt said she would look through all of her papers and call us back with the information. The call was air dropped.

## 2019-12-24 NOTE — Telephone Encounter (Signed)
error 

## 2019-12-25 ENCOUNTER — Telehealth: Payer: Self-pay | Admitting: Neurology

## 2019-12-25 ENCOUNTER — Encounter (HOSPITAL_COMMUNITY): Admission: RE | Payer: Self-pay | Source: Home / Self Care

## 2019-12-25 ENCOUNTER — Ambulatory Visit (HOSPITAL_COMMUNITY): Admission: RE | Admit: 2019-12-25 | Payer: Medicare Other | Source: Home / Self Care | Admitting: Orthopedic Surgery

## 2019-12-25 LAB — TYPE AND SCREEN
ABO/RH(D): O POS
Antibody Screen: NEGATIVE

## 2019-12-25 SURGERY — ARTHROPLASTY, HIP, TOTAL, ANTERIOR APPROACH
Anesthesia: Choice | Site: Hip | Laterality: Right

## 2019-12-25 NOTE — Telephone Encounter (Signed)
See prior note thanks

## 2019-12-25 NOTE — Telephone Encounter (Signed)
I returned patient's phone call.  Went straight to [employer]. Will call in the morning.  MRI report from [place] showed old small lacunar infarctions in the posterior right putamen, left striatal capsular region and upper pons.  These are likely small vessel and given her white matter chronic microvascular changes she is at increased risk for these types of strokes.  It also showed an old small infarction in the posterior lateral right cerebellar hemisphere, this could be small vessel disease or could also be embolic.  I will be receiving the CD with the images in the morning(awaiting CD from [place]), MRI from 2017 did not show these infarcts however did show chronic microvascular ischemic changes.  I will await the CD of this new MRI and compare images to the one in 2017 to see if any of these findings were also on the 2017 images despite not being in the neuroradiology report.  Still cannot rule out TIA however.  And if the cerebellar infarct appears embolic, this may be important to the findings on echo:  "Evidence of atrial level shunting detected by color flow Doppler. Agitated saline contrast bubble study was positive with shunting observed within 3-6 cardiac cycles suggestive of interatrial shunt. There is a moderately sized patent foramen ovale  with predominantly right to left shunting across the atrial septum. The atrial septum is aneurysmal with significant excursion."  Referral has been sent to Sherren Mocha and I am including him as Juluis Rainier on this result note.  At this time I could not sign off on patient's hip replacement surgery, I did discuss with her surgeon Dr. Maureen Ralphs and he was not comfortable at this time as well seeing as it was  an elective procedure, at least until evaluation is complete.  Of note, CTA of the head and neck were unremarkable no significant stenoses.  Dr. Legrand Como cooper and Dr. Curley Spice

## 2019-12-25 NOTE — Telephone Encounter (Signed)
Deb can we get the results asap? If debra isn;t here, would you be able to help dana?

## 2019-12-25 NOTE — Telephone Encounter (Signed)
Pt called back wanting to know the update on these records. Please advise.

## 2019-12-25 NOTE — Telephone Encounter (Signed)
It looks like she was scheduled on 12/13/19 for the CTA's at [facility] but she canceled though and then on 12/23/19 she was scheduled for the MRI at [facility] and she also canceled that too.

## 2019-12-25 NOTE — Telephone Encounter (Signed)
It looks like the patient had them done at [place] on 12/06/19.

## 2019-12-25 NOTE — Telephone Encounter (Signed)
Medical Records has been contacted .

## 2019-12-25 NOTE — Telephone Encounter (Signed)
Received the faxed on [place] desk.. I spoke to [facility] at [facility] they will get the carrier to drop the CD off tomorrow 12/26/19 morning.

## 2019-12-25 NOTE — Telephone Encounter (Signed)
Did this patient get her MRI brain and CTAs that I ordered completed? Looks like it was sent to GI and she insists she had them - maybe it went to [place]? Can we find out ASAP?

## 2019-12-25 NOTE — Telephone Encounter (Signed)
The patient canceled them on 12/04/19.

## 2019-12-26 ENCOUNTER — Telehealth: Payer: Self-pay

## 2019-12-26 NOTE — Telephone Encounter (Signed)
Scheduled patient for PFO consult with Dr. Burt Knack 5/26. She was grateful for call and agrees with treatment plan.

## 2019-12-26 NOTE — Telephone Encounter (Signed)
-----   Message from Sherren Mocha, MD sent at 12/23/2019  4:08 PM EDT ----- Regarding: RE: Patient referred to you Huel Coventry - happy to see her.  Ronalee Belts ----- Message ----- From: Melvenia Beam, MD Sent: 12/23/2019   3:14 PM EDT To: Sherren Mocha, MD Subject: Patient referred to you                        Hi Dr. Burt Knack, I referred this patient to you because I was doing a stroke and seizure workup (she had a strange episode of altered awareness) and in the process the echocardiogram with bubble showed at least a moderate pfo. I am pending the MRI of the brain to see if she had a stroke. It is unclear what it was, it could have been a TIA/stroke even though it doesn't quite fit we can't rule it out. I wasn't sure if the pfo may be an etiology if it is very large? Hope this is an appropriate referral. Thanks! Vivien Rota

## 2019-12-26 NOTE — Telephone Encounter (Signed)
I spoke to patient today. I reviewed the MRI of her brain and there are old lacunar infarct however they were also there in her 2017 MRI, I also reviewed the images with Dr. Leonie Man who is a neuroradiologist and head of stroke and we do not think the cerebellar finding is a stroke but it appears to be a blood vessel. I don;t think there are any new findings on MRI brain. EEG normal, no epileptiform activity. CTA of the head and neck normal. She does have chronic microvascular ischemia and the mentioned lacunar infarct so I advice aspirin 81mg  daily. No embolic strokes seen on MRI.   As far a hip surgery, I will sign off from neurology perspective. However I don't know why she had the syncopal episode and I think she should have the evaluation with Dr. Sherren Mocha for the PFO with shunting, she has an appointment with Dr. Burt Knack next week and a follow up with me in June.

## 2020-01-01 ENCOUNTER — Ambulatory Visit: Payer: Medicare Other | Admitting: Cardiovascular Disease

## 2020-01-01 ENCOUNTER — Encounter: Payer: Self-pay | Admitting: Cardiovascular Disease

## 2020-01-01 ENCOUNTER — Other Ambulatory Visit: Payer: Self-pay

## 2020-01-01 VITALS — BP 126/76 | HR 73 | Ht 67.0 in | Wt 163.0 lb

## 2020-01-01 DIAGNOSIS — R55 Syncope and collapse: Secondary | ICD-10-CM

## 2020-01-01 DIAGNOSIS — Q211 Atrial septal defect: Secondary | ICD-10-CM

## 2020-01-01 DIAGNOSIS — Q2112 Patent foramen ovale: Secondary | ICD-10-CM

## 2020-01-01 NOTE — Progress Notes (Signed)
Cardiology Office Note:    Date:  01/01/2020   ID:  Carolyn, [name] 10-02-1940, MRN [id number]  PCP:  Caryl Bis, MD  Cardiologist:  No primary care provider on file.  Electrophysiologist:  None   Referring MD: Caryl Bis, MD   Chief Complaint  Patient presents with  . PFO    History of Present Illness:    Carolyn [name] is a 79 y.o. female with a hx of hypertension and diabetes, presenting for evaluation of PFO.  The patient was evaluated for syncope that occurred on Easter Sunday.  She was with family and after eating Easter dinner she developed lightheadedness and weakness while sitting at the table.  She became unresponsive but did not collapse to the ground.  There is no seizure-like activity reported.  The patient had no bowel or bladder incontinence.  She had no localizing symptoms and she slowly regained normal level of consciousness.  She states that she felt better by the time EMS arrived and she was completely normal when she arrived at the emergency room at [facility].  EKG, x-ray, and lab work was unrevealing.  A CT scan of the head showed no acute findings.  She then underwent formal neurologic evaluation by Dr. Jaynee Eagles with fairly extensive testing.  An MRI of the brain showed small vessel disease and evidence of old lacunar infarcts.  There was no evidence of cardioembolic event or acute stroke.  There was no large vessel disease identified.  An echocardiogram demonstrated atrial septal aneurysm and a positive agitated saline (bubble study) for right to left shunt.  She is referred today for further cardiac evaluation.  The patient has no history of cardiovascular problems other than longstanding hypertension and type 2 diabetes.  She is noted to have hypercholesterolemia but is intolerant to statin drugs and ezetimibe.  She has no chest pain, shortness of breath, leg swelling, heart palpitations, orthopnea, or PND.  She is quite limited at present with advanced  arthritis of the right hip.  She was scheduled for hip replacement surgery and this was canceled after her syncopal episode until she completes her cardiac evaluation.  She has been through multiple surgeries in the past with no problems.   Past Medical History:  Diagnosis Date  . Anxiety   . Arthritis    RA  . CKD (chronic kidney disease)    pt. denies  . Complication of anesthesia    " my heart rate gets low"  . Depression   . Depression   . Diabetes mellitus ([place])    type 2  . GERD (gastroesophageal reflux disease)   . Hepatic steatosis   . Hyperlipidemia   . Hyperparathyroidism, primary ([place])   . Hypertension   . IBS (irritable bowel syndrome)   . Internal hemorrhoid   . OA (osteoarthritis)   . Syncope   . Tubular adenoma of colon 1999   colonoscopy  . Wears dentures   . Wears glasses     Past Surgical History:  Procedure Laterality Date  . ANKLE FRACTURE SURGERY Left yrs ago  . BACK SURGERY  04/2011   lower fpr spinal stenosis and ruptured disc  . COLONOSCOPY W/ BIOPSIES AND POLYPECTOMY    . LAMINECTOMY  2009   Dr. Gladstone Lighter  . LUMBAR DISC SURGERY  04/13/2011   L3, L4 Dr. Amil Amen  . PARATHYROIDECTOMY Right 04/27/2017   Procedure: RIGHT PARATHYROIDECTOMY;  Surgeon: Armandina Gemma, MD;  Location: [facility];  Service: General;  Laterality: Right;  . TONSILLECTOMY  as child   and adenoids  . TOTAL KNEE ARTHROPLASTY Left 05/07/2018   Procedure: LEFT TOTAL KNEE ARTHROPLASTY;  Surgeon: Gaynelle Arabian, MD;  Location: [facility];  Service: Orthopedics;  Laterality: Left;  50 mins  . VAGINAL HYSTERECTOMY  1964   partial  . vein stripping R leg      Current Medications: Current Meds  Medication Sig  . Ascorbic Acid (VITAMIN C) 1000 MG tablet Take 1,000 mg by mouth daily in the afternoon.  . Cholecalciferol (VITAMIN D-3) 125 MCG (5000 UT) TABS Take 5,000 Units by mouth daily.  . citalopram (CELEXA) 20 MG tablet Take 20 mg by mouth daily with breakfast.   . folic acid (FOLVITE) 1  MG tablet Take 1 mg by mouth daily.  Marland Kitchen gabapentin (NEURONTIN) 100 MG capsule Take 100 mg by mouth at bedtime.  . hydrocortisone (ANUSOL-HC) 2.5 % rectal cream Place 1 application rectally as needed for hemorrhoids or anal itching.  Marland Kitchen Hydrocortisone Ace-Pramoxine (PROCTOCREAM-HC RE) Place 1 application rectally 3 (three) times daily as needed (hemorrhoids/irritation.).   Marland Kitchen Liniments (SALONPAS PAIN RELIEF PATCH EX) Place 1 patch onto the skin daily as needed (pain).   Marland Kitchen losartan-hydrochlorothiazide (HYZAAR) 100-12.5 MG per tablet Take 1 tablet by mouth daily with breakfast.   . metFORMIN (GLUCOPHAGE-XR) 500 MG 24 hr tablet Take 1,000 mg by mouth every evening.   . methotrexate (RHEUMATREX) 2.5 MG tablet Take 20 mg by mouth every Sunday. Caution:Chemotherapy. Protect from light.  Take 8 pills once a week  . omeprazole (PRILOSEC) 20 MG capsule Take 20 mg by mouth daily before breakfast.   . traMADol (ULTRAM) 50 MG tablet Take 1 tablet (50 mg total) by mouth every 8 (eight) hours as needed for severe pain.  . Zinc 50 MG TABS Take 50 mg by mouth daily in the afternoon.     Allergies:   Ace inhibitors, Statins, Zetia [ezetimibe], and Sulfa antibiotics   Social History   Socioeconomic History  . Marital status: Widowed    Spouse name: Not on file  . Number of children: 3  . Years of education: 10  . Highest education level: Not on file  Occupational History  . Occupation: retired  Tobacco Use  . Smoking status: Never Smoker  . Smokeless tobacco: Never Used  Substance and Sexual Activity  . Alcohol use: Never  . Drug use: Never  . Sexual activity: Not Currently  Other Topics Concern  . Not on file  Social History Narrative   Lives alone   Drinks caffeine free diet soda daily   Left-handed   Social Determinants of Health   Financial Resource Strain:   . Difficulty of Paying Living Expenses:   Food Insecurity:   . Worried About Charity fundraiser in the Last Year:   . Academic librarian in the Last Year:   Transportation Needs:   . Film/video editor (Medical):   Marland Kitchen Lack of Transportation (Non-Medical):   Physical Activity:   . Days of Exercise per Week:   . Minutes of Exercise per Session:   Stress:   . Feeling of Stress :   Social Connections:   . Frequency of Communication with Friends and Family:   . Frequency of Social Gatherings with Friends and Family:   . Attends Religious Services:   . Active Member of Clubs or Organizations:   . Attends Archivist Meetings:   Marland Kitchen Marital Status:  Family History: The patient's family history includes Alzheimer's disease in her mother; Colon polyps in her sister; Diabetes in her sister; High blood pressure in her sister; Stomach cancer in her sister; Suicidality in her father. There is no history of Colon cancer or Breast cancer.  ROS:   Please see the history of present illness.    All other systems reviewed and are negative.  EKGs/Labs/Other Studies Reviewed:    The following studies were reviewed today: 2D Echo with bubble 12/18/2019: IMPRESSIONS    1. Left ventricular ejection fraction, by estimation, is 60 to 65%. The  left ventricle has normal function. The left ventricle has no regional  wall motion abnormalities. There is moderate left ventricular hypertrophy.  Left ventricular diastolic  parameters are consistent with Grade I diastolic dysfunction (impaired  relaxation).  2. Right ventricular systolic function is normal. The right ventricular  size is normal. There is normal pulmonary artery systolic pressure.  3. The mitral valve is grossly normal. Trivial mitral valve  regurgitation.  4. The aortic valve is tricuspid. Aortic valve regurgitation is not  visualized.  5. The inferior vena cava is normal in size with greater than 50%  respiratory variability, suggesting right atrial pressure of 3 mmHg.  6. Evidence of atrial level shunting detected by color flow Doppler.    Agitated saline contrast bubble study was positive with shunting observed  within 3-6 cardiac cycles suggestive of interatrial shunt. There is a  moderately sized patent foramen ovale  with predominantly right to left shunting across the atrial septum. The  atrial septum is aneurysmal with significant excursion.   FINDINGS  Left Ventricle: Left ventricular ejection fraction, by estimation, is 60  to 65%. The left ventricle has normal function. The left ventricle has no  regional wall motion abnormalities. The left ventricular internal cavity  size was normal in size. There is  moderate left ventricular hypertrophy. Left ventricular diastolic  parameters are consistent with Grade I diastolic dysfunction (impaired  relaxation). Indeterminate filling pressures.   Right Ventricle: The right ventricular size is normal. No increase in  right ventricular wall thickness. Right ventricular systolic function is  normal. There is normal pulmonary artery systolic pressure. The tricuspid  regurgitant velocity is 2.36 m/s, and  with an assumed right atrial pressure of 3 mmHg, the estimated right  ventricular systolic pressure is 99991111 mmHg.   Left Atrium: Left atrial size was normal in size.   Right Atrium: Right atrial size was normal in size.   Pericardium: There is no evidence of pericardial effusion.   Mitral Valve: The mitral valve is grossly normal. Trivial mitral valve  regurgitation.   Tricuspid Valve: The tricuspid valve is grossly normal. Tricuspid valve  regurgitation is trivial.   Aortic Valve: The aortic valve is tricuspid. Aortic valve regurgitation is  not visualized.   Pulmonic Valve: The pulmonic valve was grossly normal. Pulmonic valve  regurgitation is trivial.   Aorta: The aortic root and ascending aorta are structurally normal, with  no evidence of dilitation.   Venous: The inferior vena cava is normal in size with greater than 50%  respiratory variability,  suggesting right atrial pressure of 3 mmHg.   IAS/Shunts: The interatrial septum is aneurysmal. Evidence of atrial level  shunting detected by color flow Doppler. Agitated saline contrast was  given intravenously to evaluate for intracardiac shunting. Agitated saline  contrast bubble study was positive  with shunting observed within 3-6 cardiac cycles suggestive of  interatrial shunt. A moderately sized patent  foramen ovale is detected  with predominantly right to left shunting across the atrial septum.   EKG:  EKG is not ordered today.  The ekg ordered 11/10/2019 demonstrates normal sinus rhythm 72 bpm, within normal limits  Recent Labs: 12/18/2019: ALT 14; BUN 15; Creatinine, Ser 0.93; Hemoglobin 12.4; Platelets 277; Potassium 3.5; Sodium 134  Recent Lipid Panel    Component Value Date/Time   CHOL 190 12/24/2014 0219   TRIG 157 (H) 12/24/2014 0219   HDL 41 12/24/2014 0219   CHOLHDL 4.6 12/24/2014 0219   VLDL 31 12/24/2014 0219   LDLCALC 118 (H) 12/24/2014 0219    Physical Exam:    VS:  BP 126/76   Pulse 73   Ht 5\' 7"  (1.702 m)   Wt 163 lb (73.9 kg)   SpO2 96%   BMI 25.53 kg/m     Wt Readings from Last 3 Encounters:  01/01/20 163 lb (73.9 kg)  12/18/19 162 lb (73.5 kg)  11/26/19 165 lb (74.8 kg)     GEN:  Well nourished, well developed in no acute distress HEENT: Normal NECK: No JVD; No carotid bruits LYMPHATICS: No lymphadenopathy CARDIAC: RRR, no murmurs, rubs, gallops RESPIRATORY:  Clear to auscultation without rales, wheezing or rhonchi  ABDOMEN: Soft, non-tender, non-distended MUSCULOSKELETAL:  No edema; No deformity  SKIN: Warm and dry NEUROLOGIC:  Alert and oriented x 3 PSYCHIATRIC:  Normal affect   ASSESSMENT:    1. PFO with atrial septal aneurysm   2. Syncope and collapse    PLAN:    In order of problems listed above:  1. I reviewed the patient's echocardiogram and this demonstrates normal LV and RV function, no significant valvular disease, and  presence of an atrial septal aneurysm with positive bubble study typical of PFO with associated atrial septal aneurysm.  In reviewing notes from neurology with extensive documentation regarding MRI findings, it appears the patient has not had any evidence of an embolic stroke.  She does have small vessel disease and evidence of lacunar infarcts which appear to be chronic.  Her EKG shows sinus rhythm with no conduction disease.  Considering her advanced age, I think she is unlikely to benefit from transcatheter PFO closure especially in the absence of any history of embolic stroke.  I have recommended that she start aspirin 81 mg daily once she recovers from hip surgery and is off of oral anticoagulation for DVT prophylaxis.  She should have aggressive preventive measures for DVT prophylaxis with hip surgery in the setting of her PFO.  It is notable that she has had multiple surgeries in the past with no complications. 2. With a normal EKG and unremarkable echocardiogram, I do not think she needs further testing at this time.  Her episode occurred after a large meal and I suspect it was hemodynamically mediated rather than related to any arrhythmia.  She has had no other episodes of syncope in the past.  From a cardiac perspective, the patient can proceed with hip replacement surgery at low risk of cardiovascular complications.  I would be happy to see her in the future if any problems arise.  As outlined above, I have recommended that she start aspirin 81 mg daily once she completes her DVT prophylaxis regimen following hip surgery.  Medication Adjustments/Labs and Tests Ordered: Current medicines are reviewed at length with the patient today.  Concerns regarding medicines are outlined above.  No orders of the defined types were placed in this encounter.  No orders of the defined  types were placed in this encounter.   Patient Instructions  Medication Instructions:  1) START ASPIRIN 81 mg daily when you  are done with your anticoagulants after your hip surgery. *If you need a refill on your cardiac medications before your next appointment, please call your pharmacy*   Follow-Up: Please let us know if you need anything!    Signed, Sherren Mocha, MD  01/01/2020 10:00 AM    [place]

## 2020-01-01 NOTE — Patient Instructions (Addendum)
Medication Instructions:  1) START ASPIRIN 81 mg daily when you are done with your anticoagulants after your hip surgery. *If you need a refill on your cardiac medications before your next appointment, please call your pharmacy*   Follow-Up: Please let us know if you need anything!

## 2020-01-10 NOTE — Patient Instructions (Addendum)
DUE TO COVID-19 ONLY ONE VISITOR IS ALLOWED TO COME WITH YOU AND STAY IN THE WAITING ROOM ONLY DURING PRE OP AND PROCEDURE DAY OF SURGERY. THE 1 VISITOR MAY VISIT WITH YOU AFTER SURGERY IN YOUR PRIVATE ROOM DURING VISITING HOURS ONLY!  YOU NEED TO HAVE A COVID 19 TEST ON: 01/18/20 @ 12:00 pm , THIS TEST MUST BE DONE BEFORE SURGERY, COME  [address], [address] [place] , [id number].  ([place]) ONCE YOUR COVID TEST IS COMPLETED, PLEASE BEGIN THE QUARANTINE INSTRUCTIONS AS OUTLINED IN YOUR HANDOUT.                MUNA DEMERS    Your procedure is scheduled on: 01/22/20   Report to [facility] Main  Entrance   Report to admitting at: 11:00 AM     Call this number if you have problems the morning of surgery [phone]    Remember:   NO SOLID FOOD AFTER MIDNIGHT THE NIGHT PRIOR TO SURGERY. NOTHING BY MOUTH EXCEPT CLEAR LIQUIDS UNTIL: 10:30 am . PLEASE FINISH GATORADE DRINK PER SURGEON ORDER  WHICH NEEDS TO BE COMPLETED AT: 10:30 am.   CLEAR LIQUID DIET   Foods Allowed                                                                     Foods Excluded  Coffee and tea, regular and decaf                             liquids that you cannot  Plain Jell-O any favor except red or purple                                           see through such as: Fruit ices (not with fruit pulp)                                     milk, soups, orange juice  Iced Popsicles                                    All solid food Carbonated beverages, regular and diet                                    Cranberry, grape and apple juices Sports drinks like Gatorade Lightly seasoned clear broth or consume(fat free) Sugar, honey syrup  Sample Menu Breakfast                                Lunch                                     Supper Cranberry juice  Beef broth                            Chicken broth Jell-O                                     Grape juice                            Apple juice Coffee or tea                        Jell-O                                      Popsicle                                                Coffee or tea                        Coffee or tea  _____________________________________________________________________  BRUSH YOUR TEETH MORNING OF SURGERY AND RINSE YOUR MOUTH OUT, NO CHEWING GUM CANDY OR MINTS.   Take these medicines the morning of surgery with A SIP OF WATER: citalopram,omeprazole.  How to Manage Your Diabetes Before and After Surgery  Why is it important to control my blood sugar before and after surgery? . Improving blood sugar levels before and after surgery helps healing and can limit problems. . A way of improving blood sugar control is eating a healthy diet by: o  Eating less sugar and carbohydrates o  Increasing activity/exercise o  Talking with your doctor about reaching your blood sugar goals . High blood sugars (greater than 180 mg/dL) can raise your risk of infections and slow your recovery, so you will need to focus on controlling your diabetes during the weeks before surgery. . Make sure that the doctor who takes care of your diabetes knows about your planned surgery including the date and location.  How do I manage my blood sugar before surgery? . Check your blood sugar at least 4 times a day, starting 2 days before surgery, to make sure that the level is not too high or low. o Check your blood sugar the morning of your surgery when you wake up and every 2 hours until you get to the Short Stay unit. . If your blood sugar is less than 70 mg/dL, you will need to treat for low blood sugar: o Do not take insulin. o Treat a low blood sugar (less than 70 mg/dL) with  cup of clear juice (cranberry or apple), 4 glucose tablets, OR glucose gel. o Recheck blood sugar in 15 minutes after treatment (to make sure it is greater than 70 mg/dL). If your blood sugar is not greater than 70 mg/dL on recheck, call  [phone] for further instructions. . Report your blood sugar to the short stay nurse when you get to Short Stay.  . If you are admitted to the hospital after surgery: o Your blood sugar will be checked by the staff and you will probably be given insulin after  surgery (instead of oral diabetes medicines) to make sure you have good blood sugar levels. o The goal for blood sugar control after surgery is 80-180 mg/dL.   WHAT DO I DO ABOUT MY DIABETES MEDICATION?  Marland Kitchen Do not take oral diabetes medicines (pills) the morning of surgery.  . THE NIGHT BEFORE SURGERY, take Metformin as usual.       . THE MORNING OF SURGERY, DO NOT take Metformin.  DO NOT TAKE ANY DIABETIC MEDICATIONS DAY OF YOUR SURGERY                               You may not have any metal on your body including hair pins and              piercings  Do not wear jewelry, make-up, lotions, powders or perfumes, deodorant             Do not wear nail polish on your fingernails.  Do not shave  48 hours prior to surgery.              Do not bring valuables to the hospital. [place].  Contacts, dentures or bridgework may not be worn into surgery.  Leave suitcase in the car. After surgery it may be brought to your room.     Patients discharged the day of surgery will not be allowed to drive home. IF YOU ARE HAVING SURGERY AND GOING HOME THE SAME DAY, YOU MUST HAVE AN ADULT TO DRIVE YOU HOME AND BE WITH YOU FOR 24 HOURS. YOU MAY GO HOME BY TAXI OR UBER OR ORTHERWISE, BUT AN ADULT MUST ACCOMPANY YOU HOME AND STAY WITH YOU FOR 24 HOURS.  Name and phone number of your driver:  Special Instructions: N/A              Please read over the following fact sheets you were given: _____________________________________________________________________  [facility] - Preparing for Surgery Before surgery, you can play an important role.  Because skin is not sterile, your skin needs to be as  free of germs as possible.  You can reduce the number of germs on your skin by washing with CHG (chlorahexidine gluconate) soap before surgery.  CHG is an antiseptic cleaner which kills germs and bonds with the skin to continue killing germs even after washing. Please DO NOT use if you have an allergy to CHG or antibacterial soaps.  If your skin becomes reddened/irritated stop using the CHG and inform your nurse when you arrive at Short Stay. Do not shave (including legs and underarms) for at least 48 hours prior to the first CHG shower.  You may shave your face/neck. Please follow these instructions carefully:  1.  Shower with CHG Soap the night before surgery and the  morning of Surgery.  2.  If you choose to wash your hair, wash your hair first as usual with your  normal  shampoo.  3.  After you shampoo, rinse your hair and body thoroughly to remove the  shampoo.                           4.  Use CHG as you would any other liquid soap.  You can apply chg directly  to the skin and wash  Gently with a scrungie or clean washcloth.  5.  Apply the CHG Soap to your body ONLY FROM THE NECK DOWN.   Do not use on face/ open                           Wound or open sores. Avoid contact with eyes, ears mouth and genitals (private parts).                       Wash face,  Genitals (private parts) with your normal soap.             6.  Wash thoroughly, paying special attention to the area where your surgery  will be performed.  7.  Thoroughly rinse your body with warm water from the neck down.  8.  DO NOT shower/wash with your normal soap after using and rinsing off  the CHG Soap.                9.  Pat yourself dry with a clean towel.            10.  Wear clean pajamas.            11.  Place clean sheets on your bed the night of your first shower and do not  sleep with pets. Day of Surgery : Do not apply any lotions/deodorants the morning of surgery.  Please wear clean clothes to the  hospital/surgery center.  FAILURE TO FOLLOW THESE INSTRUCTIONS MAY RESULT IN THE CANCELLATION OF YOUR SURGERY PATIENT SIGNATURE_________________________________  NURSE SIGNATURE__________________________________  ________________________________________________________________________   [name]  An incentive spirometer is a tool that can help keep your lungs clear and active. This tool measures how well you are filling your lungs with each breath. Taking long deep breaths may help reverse or decrease the chance of developing breathing (pulmonary) problems (especially infection) following:  A long period of time when you are unable to move or be active. BEFORE THE PROCEDURE   If the spirometer includes an indicator to show your best effort, your nurse or respiratory therapist will set it to a desired goal.  If possible, sit up straight or lean slightly forward. Try not to slouch.  Hold the incentive spirometer in an upright position. INSTRUCTIONS FOR USE  1. Sit on the edge of your bed if possible, or sit up as far as you can in bed or on a chair. 2. Hold the incentive spirometer in an upright position. 3. Breathe out normally. 4. Place the mouthpiece in your mouth and seal your lips tightly around it. 5. Breathe in slowly and as deeply as possible, raising the piston or the ball toward the top of the column. 6. Hold your breath for 3-5 seconds or for as long as possible. Allow the piston or ball to fall to the bottom of the column. 7. Remove the mouthpiece from your mouth and breathe out normally. 8. Rest for a few seconds and repeat Steps 1 through 7 at least 10 times every 1-2 hours when you are awake. Take your time and take a few normal breaths between deep breaths. 9. The spirometer may include an indicator to show your best effort. Use the indicator as a goal to work toward during each repetition. 10. After each set of 10 deep breaths, practice coughing to be sure  your lungs are clear. If you have an incision (the cut made at the time of  surgery), support your incision when coughing by placing a pillow or rolled up towels firmly against it. Once you are able to get out of bed, walk around indoors and cough well. You may stop using the incentive spirometer when instructed by your caregiver.  RISKS AND COMPLICATIONS  Take your time so you do not get dizzy or light-headed.  If you are in pain, you may need to take or ask for pain medication before doing incentive spirometry. It is harder to take a deep breath if you are having pain. AFTER USE  Rest and breathe slowly and easily.  It can be helpful to keep track of a log of your progress. Your caregiver can provide you with a simple table to help with this. If you are using the spirometer at home, follow these instructions: [place] IF:   You are having difficultly using the spirometer.  You have trouble using the spirometer as often as instructed.  Your pain medication is not giving enough relief while using the spirometer.  You develop fever of 100.5 F (38.1 C) or higher. SEEK IMMEDIATE MEDICAL CARE IF:   You cough up bloody sputum that had not been present before.  You develop fever of 102 F (38.9 C) or greater.  You develop worsening pain at or near the incision site. MAKE SURE YOU:   Understand these instructions.  Will watch your condition.  Will get help right away if you are not doing well or get worse. Document Released: 12/05/2006 Document Revised: 10/17/2011 Document Reviewed: 02/05/2007 [facility] Patient Information 2014 [place], [place].   ________________________________________________________________________

## 2020-01-13 ENCOUNTER — Encounter (HOSPITAL_COMMUNITY): Payer: Self-pay

## 2020-01-13 ENCOUNTER — Encounter (HOSPITAL_COMMUNITY)
Admission: RE | Admit: 2020-01-13 | Discharge: 2020-01-13 | Disposition: A | Discharge status: Home / Self Care | Payer: Medicare Other | Source: Ambulatory Visit | Attending: Orthopedic Surgery | Admitting: Orthopedic Surgery

## 2020-01-13 ENCOUNTER — Other Ambulatory Visit: Payer: Self-pay

## 2020-01-13 DIAGNOSIS — R350 Frequency of micturition: Secondary | ICD-10-CM | POA: Diagnosis not present

## 2020-01-13 DIAGNOSIS — Z01812 Encounter for preprocedural laboratory examination: Secondary | ICD-10-CM | POA: Diagnosis not present

## 2020-01-13 DIAGNOSIS — R300 Dysuria: Secondary | ICD-10-CM | POA: Diagnosis not present

## 2020-01-13 LAB — COMPREHENSIVE METABOLIC PANEL
ALT: 13 U/L (ref 0–44)
AST: 25 U/L (ref 15–41)
Albumin: 4.1 g/dL (ref 3.5–5.0)
Alkaline Phosphatase: 60 U/L (ref 38–126)
Anion gap: 10 (ref 5–15)
BUN: 12 mg/dL (ref 8–23)
CO2: 27 mmol/L (ref 22–32)
Calcium: 9.8 mg/dL (ref 8.9–10.3)
Chloride: 97 mmol/L — ABNORMAL LOW (ref 98–111)
Creatinine, Ser: 0.9 mg/dL (ref 0.44–1.00)
GFR calc Af Amer: >60 mL/min (ref >60)
GFR calc non Af Amer: >60 mL/min (ref >60)
Glucose, Bld: 128 mg/dL — ABNORMAL HIGH (ref 70–99)
Potassium: 4.1 mmol/L (ref 3.5–5.1)
Sodium: 134 mmol/L — ABNORMAL LOW (ref 135–145)
Total Bilirubin: 0.9 mg/dL (ref 0.3–1.2)
Total Protein: 6.8 g/dL (ref 6.5–8.1)

## 2020-01-13 LAB — CBC
HCT: 38.6 % (ref 36.0–46.0)
Hemoglobin: 12.5 g/dL (ref 12.0–15.0)
MCH: 30.3 pg (ref 26.0–34.0)
MCHC: 32.4 g/dL (ref 30.0–36.0)
MCV: 93.5 fL (ref 80.0–100.0)
Platelets: 279 10*3/uL (ref 150–400)
RBC: 4.13 MIL/uL (ref 3.87–5.11)
RDW: 12.5 % (ref 11.5–15.5)
WBC: 4.9 10*3/uL (ref 4.0–10.5)
nRBC: 0 % (ref 0.0–0.2)

## 2020-01-13 LAB — SURGICAL PCR SCREEN
MRSA, PCR: NEGATIVE
Staphylococcus aureus: NEGATIVE

## 2020-01-13 LAB — APTT: aPTT: 30 seconds (ref 24–36)

## 2020-01-13 LAB — PROTIME-INR
INR: 1 (ref 0.8–1.2)
Prothrombin Time: 12.7 seconds (ref 11.4–15.2)

## 2020-01-13 NOTE — Progress Notes (Addendum)
COVID Vaccine Completed: yes  Date COVID Vaccine completed: 10/2019 COVID vaccine manufacturer: *Pf[employer]    [name]   PCP - Dr. Gar Ponto Cardiologist - Dr. Sherren Mocha. LOV: 01/01/20: clearance. EPIC  Chest x-ray - 11/10/19 EKG - 11/11/19 Stress Test -  ECHO - 12/18/19 Cardiac Cath -   Sleep Study -  CPAP -   Fasting Blood Sugar -100's  Checks Blood Sugar ___2__ times a day Blood Thinner Instructions: Aspirin Instructions: Last Dose:  Anesthesia review: Hx. HTN,DIA,New PFO.  Patient denies shortness of breath, fever, cough and chest pain at PAT appointment   Patient verbalized understanding of instructions that were given to them at the PAT appointment. Patient was also instructed that they will need to review over the PAT instructions again at home before surgery.

## 2020-01-17 NOTE — Progress Notes (Signed)
Anesthesia Chart Review   Case: [id number] Date/Time: 01/22/20 1315   Procedure: TOTAL HIP ARTHROPLASTY ANTERIOR APPROACH (Right Hip) - 146min   Anesthesia type: Choice   Pre-op diagnosis: Right hip osteoarthritis   [facility] ROOM 09 / [facility]   Surgeons: Gaynelle Arabian, MD      DISCUSSION:79 y.o. never smoker with h/o HTN, GERD, HLD, hyperparathyroidism, DM II, CKD, PFO, right hip OA scheduled for above procedure 01/22/2020 with Dr. Gaynelle Arabian.   Pt last seen by cardiology 01/01/2020.  Pt evaluated for episode of syncope.  Per OV note,  "1. I reviewed the patient's echocardiogram and this demonstrates normal LV and RV function, no significant valvular disease, and presence of an atrial septal aneurysm with positive bubble study typical of PFO with associated atrial septal aneurysm.  In reviewing notes from neurology with extensive documentation regarding MRI findings, it appears the patient has not had any evidence of an embolic stroke.  She does have small vessel disease and evidence of lacunar infarcts which appear to be chronic.  Her EKG shows sinus rhythm with no conduction disease.  Considering her advanced age, I think she is unlikely to benefit from transcatheter PFO closure especially in the absence of any history of embolic stroke.  I have recommended that she start aspirin 81 mg daily once she recovers from hip surgery and is off of oral anticoagulation for DVT prophylaxis.  She should have aggressive preventive measures for DVT prophylaxis with hip surgery in the setting of her PFO.  It is notable that she has had multiple surgeries in the past with no complications. 2. With a normal EKG and unremarkable echocardiogram, I do not think she needs further testing at this time.  Her episode occurred after a large meal and I suspect it was hemodynamically mediated rather than related to any arrhythmia.  She has had no other episodes of syncope in the past. From a cardiac perspective, the  patient can proceed with hip replacement surgery at low risk of cardiovascular complications.  I would be happy to see her in the future if any problems arise.  As outlined above, I have recommended that she start aspirin 81 mg daily once she completes her DVT prophylaxis regimen following hip surgery."  Anticipate pt can proceed with planned procedure barring acute status change.   VS: BP 132/78   Pulse 71   Temp 36.7 C (Oral)   Resp 18   Ht 5\' 7"  (1.702 m)   Wt 73.9 kg   SpO2 96%   BMI 25.53 kg/m   PROVIDERS: Caryl Bis, MD is PCP   Sherren Mocha, MD is Cardiologist  LABS: Labs reviewed: Acceptable for surgery. (all labs ordered are listed, but only abnormal results are displayed)  Labs Reviewed  COMPREHENSIVE METABOLIC PANEL - Abnormal; Notable for the following components:      Result Value   Sodium 134 (*)    Chloride 97 (*)    Glucose, Bld 128 (*)    All other components within normal limits  SURGICAL PCR SCREEN  APTT  CBC  PROTIME-INR  TYPE AND SCREEN     IMAGES:   EKG: 11/11/2019 Rate 72 bpm  Sinus rhythm  Abnormal R-wave progression, early transition   CV: Echo 12/18/2019 IMPRESSIONS    1. Left ventricular ejection fraction, by estimation, is 60 to 65%. The  left ventricle has normal function. The left ventricle has no regional  wall motion abnormalities. There is moderate left ventricular hypertrophy.  Left  ventricular diastolic  parameters are consistent with Grade I diastolic dysfunction (impaired  relaxation).  2. Right ventricular systolic function is normal. The right ventricular  size is normal. There is normal pulmonary artery systolic pressure.  3. The mitral valve is grossly normal. Trivial mitral valve  regurgitation.  4. The aortic valve is tricuspid. Aortic valve regurgitation is not  visualized.  5. The inferior vena cava is normal in size with greater than 50%  respiratory variability, suggesting right atrial pressure of  3 mmHg.  6. Evidence of atrial level shunting detected by color flow Doppler.  Agitated saline contrast bubble study was positive with shunting observed  within 3-6 cardiac cycles suggestive of interatrial shunt. There is a  moderately sized patent foramen ovale  with predominantly right to left shunting across the atrial septum. The  atrial septum is aneurysmal with significant excursion.   Stress Test 01/15/2015 Duke Treadmill Score: low risk The patient's response to exercise was adequate for diagnosis. The patient had an moderately low exercise tolerance.  There was no chest pain.  There was an appropriate level of dyspnea.  There were no arrhythmias, an increased heart rate response and normal BP response.  There were no ischemic ST T wave changes and a normal heart rate recovery.  Negative for any evidence of ischemia. Past Medical History:  Diagnosis Date  . Anxiety   . Arthritis    RA  . CKD (chronic kidney disease)    pt. denies  . Complication of anesthesia    " my heart rate gets low"  . Depression   . Depression   . Diabetes mellitus ([place])    type 2  . GERD (gastroesophageal reflux disease)   . Hepatic steatosis   . Hyperlipidemia   . Hyperparathyroidism, primary ([place])   . Hypertension   . IBS (irritable bowel syndrome)   . Internal hemorrhoid   . OA (osteoarthritis)   . Syncope   . Tubular adenoma of colon 1999   colonoscopy  . Wears dentures   . Wears glasses     Past Surgical History:  Procedure Laterality Date  . ANKLE FRACTURE SURGERY Left yrs ago  . BACK SURGERY  04/2011   lower fpr spinal stenosis and ruptured disc  . COLONOSCOPY W/ BIOPSIES AND POLYPECTOMY    . LAMINECTOMY  2009   Dr. Gladstone Lighter  . LUMBAR DISC SURGERY  04/13/2011   L3, L4 Dr. Amil Amen  . PARATHYROIDECTOMY Right 04/27/2017   Procedure: RIGHT PARATHYROIDECTOMY;  Surgeon: Armandina Gemma, MD;  Location: [place];  Service: General;  Laterality: Right;  . TONSILLECTOMY  as child   and  adenoids  . TOTAL KNEE ARTHROPLASTY Left 05/07/2018   Procedure: LEFT TOTAL KNEE ARTHROPLASTY;  Surgeon: Gaynelle Arabian, MD;  [facility];  Service: Orthopedics;  Laterality: Left;  50 mins  . VAGINAL HYSTERECTOMY  1964   partial  . vein stripping R leg      MEDICATIONS: . Ascorbic Acid (VITAMIN C) 1000 MG tablet  . Cholecalciferol (VITAMIN D-3) 125 MCG (5000 UT) TABS  . citalopram (CELEXA) 20 MG tablet  . folic acid (FOLVITE) 1 MG tablet  . gabapentin (NEURONTIN) 100 MG capsule  . Liniments (SALONPAS PAIN RELIEF PATCH EX)  . losartan-hydrochlorothiazide (HYZAAR) 100-12.5 MG per tablet  . metFORMIN (GLUCOPHAGE-XR) 500 MG 24 hr tablet  . methotrexate (RHEUMATREX) 2.5 MG tablet  . omeprazole (PRILOSEC) 20 MG capsule  . traMADol (ULTRAM) 50 MG tablet  . traZODone (DESYREL) 50 MG tablet  .  Zinc 50 MG TABS   No current facility-administered medications for this encounter.   . sodium chloride flush (NS) 0.9 % injection 16 mL    Maia Plan [facility] Pre-Surgical Testing [phone] 01/17/20  1:20 PM

## 2020-01-17 NOTE — Anesthesia Preprocedure Evaluation (Addendum)
Anesthesia Evaluation  Patient identified by MRN, date of birth, ID band Patient awake    Reviewed: Allergy & Precautions, NPO status , Patient's Chart, lab work & pertinent test results  Airway Mallampati: II  TM Distance: >3 FB Neck ROM: Full    Dental no notable dental hx. (+) Upper Dentures   Pulmonary neg pulmonary ROS,    Pulmonary exam normal breath sounds clear to auscultation       Cardiovascular hypertension, Pt. on medications Normal cardiovascular exam Rhythm:Regular Rate:Normal  12/18/19 Echo . Left ventricular ejection fraction, by estimation, is 60 to 65%. The  left ventricle has normal function. The left ventricle has no regional  wall motion abnormalities. There is moderate left ventricular hypertrophy.  Left ventricular diastolic  parameters are consistent with Grade I diastolic dysfunction (impaired  relaxation).    Neuro/Psych PSYCHIATRIC DISORDERS Depression negative neurological ROS     GI/Hepatic Neg liver ROS, GERD  Medicated,  Endo/Other  diabetes, Type 2, Oral Hypoglycemic Agents  Renal/GU negative Renal ROS     Musculoskeletal  (+) Arthritis ,   Abdominal   Peds  Hematology Lab Results      Component                Value               Date                      WBC                      4.9                 01/13/2020                HGB                      12.5                01/13/2020                HCT                      38.6                01/13/2020                MCV                      93.5                01/13/2020                PLT                      279                 01/13/2020              Anesthesia Other Findings   Reproductive/Obstetrics negative OB ROS                           Anesthesia Physical Anesthesia Plan  ASA: II  Anesthesia Plan: Spinal   Post-op Pain Management:    Induction:   PONV Risk Score and Plan: Treatment may  vary due to age or medical condition  Airway Management Planned: Nasal  Cannula and Natural Airway  Additional Equipment: None  Intra-op Plan:   Post-operative Plan:   Informed Consent: I have reviewed the patients History and Physical, chart, labs and discussed the procedure including the risks, benefits and alternatives for the proposed anesthesia with the patient or authorized representative who has indicated his/her understanding and acceptance.     Dental advisory given  Plan Discussed with: CRNA  Anesthesia Plan Comments: (See PAT note 01/13/2020, Konrad Felix, PA-C   )      Anesthesia Quick Evaluation

## 2020-01-18 ENCOUNTER — Other Ambulatory Visit (HOSPITAL_COMMUNITY)
Admission: RE | Admit: 2020-01-18 | Discharge: 2020-01-18 | Disposition: A | Discharge status: Home / Self Care | Payer: Medicare Other | Source: Ambulatory Visit | Attending: Orthopedic Surgery | Admitting: Orthopedic Surgery

## 2020-01-18 DIAGNOSIS — Z20822 Contact with and (suspected) exposure to covid-19: Secondary | ICD-10-CM | POA: Diagnosis not present

## 2020-01-18 DIAGNOSIS — Z01812 Encounter for preprocedural laboratory examination: Secondary | ICD-10-CM | POA: Diagnosis not present

## 2020-01-18 LAB — SARS CORONAVIRUS 2 (TAT 6-24 HRS): SARS Coronavirus 2: NEGATIVE

## 2020-01-20 NOTE — H&P (Signed)
TOTAL HIP ADMISSION H&P  Patient is admitted for right total hip arthroplasty.  Subjective:  Chief Complaint: Right hip pain  HPI: Carolyn [name], 79 y.o. female, has a history of pain and functional disability in the right hip due to arthritis and patient has failed non-surgical conservative treatments for greater than 12 weeks to include corticosteriod injections and activity modification. Onset of symptoms was gradual, starting several years ago with gradually worsening course since that time. The patient noted no past surgery on the right hip. Patient currently rates pain in the right hip at 9 out of 10 with activity. Patient has worsening of pain with activity and weight bearing, pain that interfers with activities of daily living and crepitus. Patient has evidence of end-stage arthritis of the right hip, bone-on-bone throughout. by imaging studies. This condition presents safety issues increasing the risk of falls. There is no current active infection.  Patient Active Problem List   Diagnosis Date Noted  . Syncope and collapse 11/26/2019  . Abdominal bloating 08/22/2019  . Lower abdominal pain 08/22/2019  . OA (osteoarthritis) of knee 05/07/2018  . Hyperparathyroidism, primary ([place]) 04/25/2017  . Falls 07/19/2016  . Precordial chest pain 12/23/2014  . Hypercalcemia 12/23/2014  . Diabetes ([place]) 10/23/2013  . HTN (hypertension) 10/23/2013    Past Medical History:  Diagnosis Date  . Anxiety   . Arthritis    RA  . CKD (chronic kidney disease)    pt. denies  . Complication of anesthesia    " my heart rate gets low"  . Depression   . Depression   . Diabetes mellitus ([place])    type 2  . GERD (gastroesophageal reflux disease)   . Hepatic steatosis   . Hyperlipidemia   . Hyperparathyroidism, primary ([place])   . Hypertension   . IBS (irritable bowel syndrome)   . Internal hemorrhoid   . OA (osteoarthritis)   . Syncope   . Tubular adenoma of colon 1999   colonoscopy  . Wears  dentures   . Wears glasses     Past Surgical History:  Procedure Laterality Date  . ANKLE FRACTURE SURGERY Left yrs ago  . BACK SURGERY  04/2011   lower fpr spinal stenosis and ruptured disc  . COLONOSCOPY W/ BIOPSIES AND POLYPECTOMY    . LAMINECTOMY  2009   Dr. Gladstone Lighter  . LUMBAR DISC SURGERY  04/13/2011   L3, L4 Dr. Amil Amen  . PARATHYROIDECTOMY Right 04/27/2017   Procedure: RIGHT PARATHYROIDECTOMY;  Surgeon: Armandina Gemma, MD;  Location: [facility];  Service: General;  Laterality: Right;  . TONSILLECTOMY  as child   and adenoids  . TOTAL KNEE ARTHROPLASTY Left 05/07/2018   Procedure: LEFT TOTAL KNEE ARTHROPLASTY;  Surgeon: Gaynelle Arabian, MD;  Location: [facility];  Service: Orthopedics;  Laterality: Left;  50 mins  . VAGINAL HYSTERECTOMY  1964   partial  . vein stripping R leg      Prior to Admission medications   Medication Sig Start Date End Date Taking? Authorizing Provider  citalopram (CELEXA) 20 MG tablet Take 20 mg by mouth daily with breakfast.  01/05/12  Yes [provider]  gabapentin (NEURONTIN) 100 MG capsule Take 100 mg by mouth at bedtime. 12/04/19  Yes [provider]  losartan-hydrochlorothiazide (HYZAAR) 100-12.5 MG per tablet Take 1 tablet by mouth daily with breakfast.  12/16/11  Yes [provider]  metFORMIN (GLUCOPHAGE-XR) 500 MG 24 hr tablet Take 1,000 mg by mouth every evening.  11/23/11  Yes [provider]  omeprazole (PRILOSEC) 20 MG capsule Take 20 mg by mouth daily before breakfast.  11/12/11  Yes [provider]  traMADol (ULTRAM) 50 MG tablet Take 1 tablet (50 mg total) by mouth every 8 (eight) hours as needed for severe pain. Patient taking differently: Take 50 mg by mouth every 8 (eight) hours as needed for severe pain (for pain.).  11/10/19  Yes Hayden Rasmussen, MD  traZODone (DESYREL) 50 MG tablet Take 100 mg by mouth at bedtime.   Yes [provider]  Ascorbic Acid (VITAMIN C) 1000 MG tablet Take 1,000 mg by  mouth daily in the afternoon.    [provider]  Cholecalciferol (VITAMIN D-3) 125 MCG (5000 UT) TABS Take 5,000 Units by mouth daily.    [provider]  folic acid (FOLVITE) 1 MG tablet Take 1 mg by mouth daily.    [provider]  Liniments (SALONPAS PAIN RELIEF PATCH EX) Place 1 patch onto the skin daily as needed (pain).     [provider]  methotrexate (RHEUMATREX) 2.5 MG tablet Take 22.5 mg by mouth every Sunday. Caution:Chemotherapy. Protect from light.  Take 9 pills once a week    [provider]  Zinc 50 MG TABS Take 50 mg by mouth daily in the afternoon.    [provider]    Allergies  Allergen Reactions  . Ace Inhibitors     cough  . Statins Other (See Comments)    Muscle pain/myalgias  . Zetia [Ezetimibe]     headache  . Sulfa Antibiotics Rash    Social History   Socioeconomic History  . Marital status: Widowed    Spouse name: Not on file  . Number of children: 3  . Years of education: 10  . Highest education level: Not on file  Occupational History  . Occupation: retired  Tobacco Use  . Smoking status: Never Smoker  . Smokeless tobacco: Never Used  Vaping Use  . Vaping Use: Never used  Substance and Sexual Activity  . Alcohol use: Never  . Drug use: Never  . Sexual activity: Not Currently  Other Topics Concern  . Not on file  Social History Narrative   Lives alone   Drinks caffeine free diet soda daily   Left-handed   Social Determinants of Health   Financial Resource Strain:   . Difficulty of Paying Living Expenses:   Food Insecurity:   . Worried About Charity fundraiser in the Last Year:   . Arboriculturist in the Last Year:   Transportation Needs:   . Film/video editor (Medical):   Marland Kitchen Lack of Transportation (Non-Medical):   Physical Activity:   . Days of Exercise per Week:   . Minutes of Exercise per Session:   Stress:   . Feeling of Stress :   Social Connections:   . Frequency  of Communication with Friends and Family:   . Frequency of Social Gatherings with Friends and Family:   . Attends Religious Services:   . Active Member of Clubs or Organizations:   . Attends Archivist Meetings:   Marland Kitchen Marital Status:   Intimate Partner Violence:   . Fear of Current or Ex-Partner:   . Emotionally Abused:   Marland Kitchen Physically Abused:   . Sexually Abused:       Tobacco Use: Low Risk   . Smoking Tobacco Use: Never Smoker  . Smokeless Tobacco Use: Never Used   Social History   Substance and  Sexual Activity  Alcohol Use Never    Family History  Problem Relation Age of Onset  . Stomach cancer Sister   . Diabetes Sister   . Colon polyps Sister        hyperplastic  . High blood pressure Sister   . Alzheimer's disease Mother   . Suicidality Father   . Colon cancer Neg Hx   . Breast cancer Neg Hx     Review of Systems  Constitutional: Negative for chills and fever.  HENT: Negative for congestion, sore throat and tinnitus.   Eyes: Negative for double vision, photophobia and pain.  Respiratory: Negative for cough, shortness of breath and wheezing.   Cardiovascular: Negative for chest pain, palpitations and orthopnea.  Gastrointestinal: Negative for heartburn, nausea and vomiting.  Genitourinary: Negative for dysuria, frequency and urgency.  Musculoskeletal: Positive for joint pain.  Neurological: Negative for dizziness, weakness and headaches.     Objective:  Physical Exam:  Well nourished and well developed.  General: Alert and oriented x3, cooperative and pleasant, no acute distress.  Head: normocephalic, atraumatic, neck supple.  Eyes: EOMI.  Respiratory: breath sounds clear in all fields, no wheezing, rales, or rhonchi. Cardiovascular: Regular rate and rhythm, no murmurs, gallops or rubs.  Abdomen: non-tender to palpation and soft, normoactive bowel sounds. Musculoskeletal:  Right Hip Exam: Inspection: Skin is intact. Palpation: No tenderness  to palpation. Range of Motion: Flexion to 100 degrees. Minimal internal rotation to 20 degrees. External rotation to 20 degrees. Abduction to 20 degrees.  Calves soft and nontender. Motor function intact in LE. Strength 5/5 LE bilaterally. Neuro: Distal pulses 2+. Sensation to light touch intact in LE.  Imaging Review Plain radiographs demonstrate severe degenerative joint disease of the right hip. The bone quality appears to be adequate for age and reported activity level.  Assessment/Plan:  End stage arthritis, right hip  The patient history, physical examination, clinical judgement of the provider and imaging studies are consistent with end stage degenerative joint disease of the right hip and total hip arthroplasty is deemed medically necessary. The treatment options including medical management, injection therapy, arthroscopy and arthroplasty were discussed at length. The risks and benefits of total hip arthroplasty were presented and reviewed. The risks due to aseptic loosening, infection, stiffness, dislocation/subluxation, thromboembolic complications and other imponderables were discussed. The patient acknowledged the explanation, agreed to proceed with the plan and consent was signed. Patient is being admitted for inpatient treatment for surgery, pain control, PT, OT, prophylactic antibiotics, VTE prophylaxis, progressive ambulation and ADLs and discharge planning.The patient is planning to be discharged home.   Patient's anticipated LOS is less than 2 midnights, meeting these requirements: - Younger than 61 - Lives within 1 hour of care - Has a competent adult at home to recover with post-op recover - NO history of  - Chronic pain requiring opiods  - Diabetes  - Coronary Artery Disease  - Heart failure  - Heart attack  - Stroke  - DVT/VTE  - Cardiac arrhythmia  - Respiratory Failure/COPD  - Renal failure  - Anemia  - Advanced Liver disease  Therapy Plans:  HEP Disposition: Home with family Planned DVT Prophylaxis: Aspirin 325 mg BID DME Needed: None PCP: Gar Ponto, MD (clearance received) Neurologist: Sarina Ill, MD (last surgery cancelled due to syncope) TXA: IV Allergies: Sulfa (rash) Anesthesia Concerns: None BMI: 26.1 Last HgbA1c: 6.2%  - Patient was instructed on what medications to stop prior to surgery. - Follow-up visit in 2 weeks with Dr.  Aluisio - Begin physical therapy following surgery - Pre-operative lab work as pre-surgical testing - Prescriptions will be provided in hospital at time of discharge  Theresa Duty, PA-C Orthopedic Surgery [facility] [place]

## 2020-01-22 ENCOUNTER — Ambulatory Visit (HOSPITAL_COMMUNITY)
Admission: RE | Admit: 2020-01-22 | Discharge: 2020-01-23 | Disposition: A | Discharge status: Home / Self Care | Payer: Medicare Other | Attending: Orthopedic Surgery | Admitting: Orthopedic Surgery

## 2020-01-22 ENCOUNTER — Ambulatory Visit (HOSPITAL_COMMUNITY): Payer: Medicare Other | Admitting: Certified Registered Nurse Anesthetist

## 2020-01-22 ENCOUNTER — Ambulatory Visit (HOSPITAL_COMMUNITY): Payer: Medicare Other

## 2020-01-22 ENCOUNTER — Ambulatory Visit (HOSPITAL_COMMUNITY): Payer: Medicare Other | Admitting: Physician Assistant

## 2020-01-22 ENCOUNTER — Other Ambulatory Visit: Payer: Self-pay

## 2020-01-22 ENCOUNTER — Encounter (HOSPITAL_COMMUNITY): Payer: Self-pay | Admitting: Orthopedic Surgery

## 2020-01-22 ENCOUNTER — Encounter (HOSPITAL_COMMUNITY)
Admission: RE | Disposition: A | Discharge status: Home / Self Care | Payer: Self-pay | Source: Home / Self Care | Attending: Orthopedic Surgery

## 2020-01-22 DIAGNOSIS — Z7984 Long term (current) use of oral hypoglycemic drugs: Secondary | ICD-10-CM | POA: Insufficient documentation

## 2020-01-22 DIAGNOSIS — Z79899 Other long term (current) drug therapy: Secondary | ICD-10-CM | POA: Insufficient documentation

## 2020-01-22 DIAGNOSIS — K219 Gastro-esophageal reflux disease without esophagitis: Secondary | ICD-10-CM | POA: Insufficient documentation

## 2020-01-22 DIAGNOSIS — M1990 Unspecified osteoarthritis, unspecified site: Secondary | ICD-10-CM | POA: Insufficient documentation

## 2020-01-22 DIAGNOSIS — I129 Hypertensive chronic kidney disease with stage 1 through stage 4 chronic kidney disease, or unspecified chronic kidney disease: Secondary | ICD-10-CM | POA: Diagnosis not present

## 2020-01-22 DIAGNOSIS — M1611 Unilateral primary osteoarthritis, right hip: Secondary | ICD-10-CM | POA: Diagnosis present

## 2020-01-22 DIAGNOSIS — M1710 Unilateral primary osteoarthritis, unspecified knee: Secondary | ICD-10-CM | POA: Diagnosis not present

## 2020-01-22 DIAGNOSIS — E1122 Type 2 diabetes mellitus with diabetic chronic kidney disease: Secondary | ICD-10-CM | POA: Insufficient documentation

## 2020-01-22 DIAGNOSIS — Z96649 Presence of unspecified artificial hip joint: Secondary | ICD-10-CM

## 2020-01-22 DIAGNOSIS — Z419 Encounter for procedure for purposes other than remedying health state, unspecified: Secondary | ICD-10-CM

## 2020-01-22 DIAGNOSIS — F329 Major depressive disorder, single episode, unspecified: Secondary | ICD-10-CM | POA: Diagnosis not present

## 2020-01-22 DIAGNOSIS — Z96652 Presence of left artificial knee joint: Secondary | ICD-10-CM | POA: Insufficient documentation

## 2020-01-22 DIAGNOSIS — Z96641 Presence of right artificial hip joint: Secondary | ICD-10-CM | POA: Diagnosis not present

## 2020-01-22 DIAGNOSIS — Z471 Aftercare following joint replacement surgery: Secondary | ICD-10-CM | POA: Diagnosis not present

## 2020-01-22 DIAGNOSIS — E785 Hyperlipidemia, unspecified: Secondary | ICD-10-CM | POA: Insufficient documentation

## 2020-01-22 DIAGNOSIS — N189 Chronic kidney disease, unspecified: Secondary | ICD-10-CM | POA: Diagnosis not present

## 2020-01-22 DIAGNOSIS — M169 Osteoarthritis of hip, unspecified: Secondary | ICD-10-CM | POA: Diagnosis present

## 2020-01-22 DIAGNOSIS — E210 Primary hyperparathyroidism: Secondary | ICD-10-CM | POA: Diagnosis not present

## 2020-01-22 HISTORY — PX: TOTAL HIP ARTHROPLASTY: SHX124

## 2020-01-22 LAB — GLUCOSE, CAPILLARY
Glucose-Capillary: 141 mg/dL — ABNORMAL HIGH (ref 70–99)
Glucose-Capillary: 148 mg/dL — ABNORMAL HIGH (ref 70–99)
Glucose-Capillary: 159 mg/dL — ABNORMAL HIGH (ref 70–99)
Glucose-Capillary: 310 mg/dL — ABNORMAL HIGH (ref 70–99)

## 2020-01-22 LAB — TYPE AND SCREEN
ABO/RH(D): O POS
Antibody Screen: NEGATIVE

## 2020-01-22 SURGERY — ARTHROPLASTY, HIP, TOTAL, ANTERIOR APPROACH
Anesthesia: Spinal | Site: Hip | Laterality: Right

## 2020-01-22 MED ORDER — ONDANSETRON HCL 4 MG PO TABS: 4.0000 mg | ORAL_TABLET | Freq: Four times a day (QID) | ORAL | Status: DC | PRN

## 2020-01-22 MED ORDER — HYDROCODONE-ACETAMINOPHEN 7.5-325 MG PO TABS
1.0000 | ORAL_TABLET | ORAL | Status: DC | PRN
  Administered 2020-01-22: 1 via ORAL
  Administered 2020-01-22 – 2020-01-23 (×4): 2 via ORAL
  Filled 2020-01-22 (×4): qty 2

## 2020-01-22 MED ORDER — FENTANYL CITRATE (PF) 100 MCG/2ML IJ SOLN
INTRAMUSCULAR | Status: AC
  Filled 2020-01-22: qty 2

## 2020-01-22 MED ORDER — LACTATED RINGERS IV SOLN: INTRAVENOUS | Status: DC

## 2020-01-22 MED ORDER — FENTANYL CITRATE (PF) 100 MCG/2ML IJ SOLN
INTRAMUSCULAR | Status: DC | PRN
  Administered 2020-01-22 (×2): 50 ug via INTRAVENOUS

## 2020-01-22 MED ORDER — CEFAZOLIN SODIUM-DEXTROSE 2-4 GM/100ML-% IV SOLN
2.0000 g | INTRAVENOUS | Status: AC
  Administered 2020-01-22: 2 g via INTRAVENOUS
  Filled 2020-01-22: qty 100

## 2020-01-22 MED ORDER — MORPHINE SULFATE (PF) 4 MG/ML IV SOLN: 0.5000 mg | INTRAVENOUS | Status: DC | PRN

## 2020-01-22 MED ORDER — TRANEXAMIC ACID-NACL 1000-0.7 MG/100ML-% IV SOLN
1000.0000 mg | INTRAVENOUS | Status: AC
  Administered 2020-01-22: 1000 mg via INTRAVENOUS
  Filled 2020-01-22: qty 100

## 2020-01-22 MED ORDER — POLYETHYLENE GLYCOL 3350 17 G PO PACK: 17.0000 g | PACK | Freq: Every day | ORAL | Status: DC | PRN

## 2020-01-22 MED ORDER — DOCUSATE SODIUM 100 MG PO CAPS
100.0000 mg | ORAL_CAPSULE | Freq: Two times a day (BID) | ORAL | Status: DC
  Administered 2020-01-22 – 2020-01-23 (×2): 100 mg via ORAL
  Filled 2020-01-22 (×2): qty 1

## 2020-01-22 MED ORDER — BUPIVACAINE HCL 0.25 % IJ SOLN
INTRAMUSCULAR | Status: AC
  Filled 2020-01-22: qty 1

## 2020-01-22 MED ORDER — ONDANSETRON HCL 4 MG/2ML IJ SOLN
INTRAMUSCULAR | Status: DC | PRN
  Administered 2020-01-22: 4 mg via INTRAVENOUS

## 2020-01-22 MED ORDER — FENTANYL CITRATE (PF) 100 MCG/2ML IJ SOLN
INTRAMUSCULAR | Status: AC
  Administered 2020-01-22: 50 ug via INTRAVENOUS
  Filled 2020-01-22: qty 2

## 2020-01-22 MED ORDER — METHOCARBAMOL 500 MG IVPB - SIMPLE MED
500.0000 mg | Freq: Four times a day (QID) | INTRAVENOUS | Status: DC | PRN
  Administered 2020-01-22: 500 mg via INTRAVENOUS
  Filled 2020-01-22: qty 50

## 2020-01-22 MED ORDER — HYDROCODONE-ACETAMINOPHEN 5-325 MG PO TABS: 1.0000 | ORAL_TABLET | ORAL | Status: DC | PRN

## 2020-01-22 MED ORDER — ACETAMINOPHEN 10 MG/ML IV SOLN: 1000.0000 mg | Freq: Once | INTRAVENOUS | Status: DC | PRN

## 2020-01-22 MED ORDER — LOSARTAN POTASSIUM 50 MG PO TABS
100.0000 mg | ORAL_TABLET | Freq: Every day | ORAL | Status: DC
  Administered 2020-01-23: 100 mg via ORAL
  Filled 2020-01-22: qty 2

## 2020-01-22 MED ORDER — INSULIN ASPART 100 UNIT/ML ~~LOC~~ SOLN
0.0000 [IU] | Freq: Every day | SUBCUTANEOUS | Status: DC
  Administered 2020-01-22: 4 [IU] via SUBCUTANEOUS

## 2020-01-22 MED ORDER — METOCLOPRAMIDE HCL 5 MG/ML IJ SOLN: 5.0000 mg | Freq: Three times a day (TID) | INTRAMUSCULAR | Status: DC | PRN

## 2020-01-22 MED ORDER — PHENOL 1.4 % MT LIQD: 1.0000 | OROMUCOSAL | Status: DC | PRN

## 2020-01-22 MED ORDER — TRAZODONE HCL 100 MG PO TABS
100.0000 mg | ORAL_TABLET | Freq: Every evening | ORAL | Status: DC | PRN
  Administered 2020-01-22: 100 mg via ORAL
  Filled 2020-01-22: qty 1

## 2020-01-22 MED ORDER — CEFAZOLIN SODIUM-DEXTROSE 2-4 GM/100ML-% IV SOLN
2.0000 g | Freq: Four times a day (QID) | INTRAVENOUS | Status: AC
  Administered 2020-01-22 (×2): 2 g via INTRAVENOUS
  Filled 2020-01-22 (×2): qty 100

## 2020-01-22 MED ORDER — DEXAMETHASONE SODIUM PHOSPHATE 10 MG/ML IJ SOLN
INTRAMUSCULAR | Status: AC
  Filled 2020-01-22: qty 1

## 2020-01-22 MED ORDER — METHOCARBAMOL 500 MG IVPB - SIMPLE MED
INTRAVENOUS | Status: AC
  Filled 2020-01-22: qty 50

## 2020-01-22 MED ORDER — HYDROCHLOROTHIAZIDE 12.5 MG PO CAPS
12.5000 mg | ORAL_CAPSULE | Freq: Every day | ORAL | Status: DC
  Administered 2020-01-23: 12.5 mg via ORAL
  Filled 2020-01-22: qty 1

## 2020-01-22 MED ORDER — INSULIN ASPART 100 UNIT/ML ~~LOC~~ SOLN
0.0000 [IU] | Freq: Three times a day (TID) | SUBCUTANEOUS | Status: DC
  Administered 2020-01-22 – 2020-01-23 (×2): 3 [IU] via SUBCUTANEOUS

## 2020-01-22 MED ORDER — GABAPENTIN 100 MG PO CAPS
100.0000 mg | ORAL_CAPSULE | Freq: Every day | ORAL | Status: DC
  Administered 2020-01-22: 100 mg via ORAL
  Filled 2020-01-22: qty 1

## 2020-01-22 MED ORDER — PROPOFOL 500 MG/50ML IV EMUL
INTRAVENOUS | Status: DC | PRN
  Administered 2020-01-22: 80 ug/kg/min via INTRAVENOUS

## 2020-01-22 MED ORDER — HYDROCODONE-ACETAMINOPHEN 7.5-325 MG PO TABS
ORAL_TABLET | ORAL | Status: AC
  Administered 2020-01-22: 1 via ORAL
  Filled 2020-01-22: qty 1

## 2020-01-22 MED ORDER — PHENYLEPHRINE HCL-NACL 10-0.9 MG/250ML-% IV SOLN
INTRAVENOUS | Status: DC | PRN
  Administered 2020-01-22: 30 ug/min via INTRAVENOUS
  Administered 2020-01-22: 50 ug/min via INTRAVENOUS

## 2020-01-22 MED ORDER — MORPHINE SULFATE (PF) 4 MG/ML IV SOLN
INTRAVENOUS | Status: AC
  Administered 2020-01-22: 1 mg via INTRAVENOUS
  Filled 2020-01-22: qty 1

## 2020-01-22 MED ORDER — METOCLOPRAMIDE HCL 5 MG PO TABS: 5.0000 mg | ORAL_TABLET | Freq: Three times a day (TID) | ORAL | Status: DC | PRN

## 2020-01-22 MED ORDER — ASPIRIN EC 325 MG PO TBEC
325.0000 mg | DELAYED_RELEASE_TABLET | Freq: Two times a day (BID) | ORAL | Status: DC
  Administered 2020-01-23: 325 mg via ORAL
  Filled 2020-01-22: qty 1

## 2020-01-22 MED ORDER — MAGNESIUM CITRATE PO SOLN: 1.0000 | Freq: Once | ORAL | Status: DC | PRN

## 2020-01-22 MED ORDER — PHENYLEPHRINE 40 MCG/ML (10ML) SYRINGE FOR IV PUSH (FOR BLOOD PRESSURE SUPPORT)
PREFILLED_SYRINGE | INTRAVENOUS | Status: AC
  Filled 2020-01-22: qty 20

## 2020-01-22 MED ORDER — BUPIVACAINE IN DEXTROSE 0.75-8.25 % IT SOLN
INTRATHECAL | Status: DC | PRN
  Administered 2020-01-22: 1.5 mL via INTRATHECAL

## 2020-01-22 MED ORDER — METHOCARBAMOL 500 MG PO TABS
500.0000 mg | ORAL_TABLET | Freq: Four times a day (QID) | ORAL | Status: DC | PRN
  Administered 2020-01-22 – 2020-01-23 (×2): 500 mg via ORAL
  Filled 2020-01-22 (×2): qty 1

## 2020-01-22 MED ORDER — MENTHOL 3 MG MT LOZG: 1.0000 | LOZENGE | OROMUCOSAL | Status: DC | PRN

## 2020-01-22 MED ORDER — LOSARTAN POTASSIUM-HCTZ 100-12.5 MG PO TABS: 1.0000 | ORAL_TABLET | Freq: Every day | ORAL | Status: DC

## 2020-01-22 MED ORDER — DEXAMETHASONE SODIUM PHOSPHATE 10 MG/ML IJ SOLN
8.0000 mg | Freq: Once | INTRAMUSCULAR | Status: AC
  Administered 2020-01-22: 5 mg via INTRAVENOUS

## 2020-01-22 MED ORDER — EPHEDRINE 5 MG/ML INJ
INTRAVENOUS | Status: AC
  Filled 2020-01-22: qty 20

## 2020-01-22 MED ORDER — HYDROCODONE-ACETAMINOPHEN 7.5-325 MG PO TABS
ORAL_TABLET | ORAL | Status: AC
  Filled 2020-01-22: qty 1

## 2020-01-22 MED ORDER — WATER FOR IRRIGATION, STERILE IR SOLN
Status: DC | PRN
  Administered 2020-01-22: 2000 mL

## 2020-01-22 MED ORDER — PROPOFOL 10 MG/ML IV BOLUS
INTRAVENOUS | Status: DC | PRN
  Administered 2020-01-22: 20 mg via INTRAVENOUS

## 2020-01-22 MED ORDER — ACETAMINOPHEN 10 MG/ML IV SOLN
1000.0000 mg | Freq: Four times a day (QID) | INTRAVENOUS | Status: DC
  Administered 2020-01-22: 1000 mg via INTRAVENOUS
  Filled 2020-01-22: qty 100

## 2020-01-22 MED ORDER — ONDANSETRON HCL 4 MG/2ML IJ SOLN: 4.0000 mg | Freq: Four times a day (QID) | INTRAMUSCULAR | Status: DC | PRN

## 2020-01-22 MED ORDER — FENTANYL CITRATE (PF) 100 MCG/2ML IJ SOLN
25.0000 ug | INTRAMUSCULAR | Status: DC | PRN
  Administered 2020-01-22: 50 ug via INTRAVENOUS

## 2020-01-22 MED ORDER — DEXAMETHASONE SODIUM PHOSPHATE 10 MG/ML IJ SOLN: 10.0000 mg | Freq: Once | INTRAMUSCULAR | Status: DC

## 2020-01-22 MED ORDER — SODIUM CHLORIDE 0.9 % IV SOLN: INTRAVENOUS | Status: DC

## 2020-01-22 MED ORDER — 0.9 % SODIUM CHLORIDE (POUR BTL) OPTIME
TOPICAL | Status: DC | PRN
  Administered 2020-01-22: 1000 mL

## 2020-01-22 MED ORDER — FENTANYL CITRATE (PF) 100 MCG/2ML IJ SOLN
25.0000 ug | INTRAMUSCULAR | Status: DC | PRN
Start: 2020-01-22 — End: 2020-01-22

## 2020-01-22 MED ORDER — PANTOPRAZOLE SODIUM 40 MG PO TBEC
40.0000 mg | DELAYED_RELEASE_TABLET | Freq: Every day | ORAL | Status: DC
  Administered 2020-01-23: 40 mg via ORAL
  Filled 2020-01-22: qty 1

## 2020-01-22 MED ORDER — CITALOPRAM HYDROBROMIDE 20 MG PO TABS
20.0000 mg | ORAL_TABLET | Freq: Every day | ORAL | Status: DC
  Administered 2020-01-23: 20 mg via ORAL
  Filled 2020-01-22: qty 1

## 2020-01-22 MED ORDER — POVIDONE-IODINE 10 % EX SWAB
2.0000 "application " | Freq: Once | CUTANEOUS | Status: AC
  Administered 2020-01-22: 2 via TOPICAL

## 2020-01-22 MED ORDER — ONDANSETRON HCL 4 MG/2ML IJ SOLN
INTRAMUSCULAR | Status: AC
  Filled 2020-01-22: qty 2

## 2020-01-22 MED ORDER — BISACODYL 10 MG RE SUPP: 10.0000 mg | Freq: Every day | RECTAL | Status: DC | PRN

## 2020-01-22 MED ORDER — BUPIVACAINE HCL 0.25 % IJ SOLN
INTRAMUSCULAR | Status: DC | PRN
  Administered 2020-01-22: 30 mL

## 2020-01-22 MED ORDER — ACETAMINOPHEN 325 MG PO TABS: 325.0000 mg | ORAL_TABLET | Freq: Four times a day (QID) | ORAL | Status: DC | PRN

## 2020-01-22 MED ORDER — ONDANSETRON HCL 4 MG/2ML IJ SOLN: 4.0000 mg | Freq: Once | INTRAMUSCULAR | Status: DC | PRN

## 2020-01-22 SURGICAL SUPPLY — 47 items
BAG DECANTER FOR FLEXI CONT (MISCELLANEOUS) IMPLANT
BAG SPEC THK2 15X12 ZIP CLS (MISCELLANEOUS)
BAG ZIPLOCK 12X15 (MISCELLANEOUS) IMPLANT
BLADE SAG 18X100X1.27 (BLADE) ×2 IMPLANT
CLSR STERI-STRIP ANTIMIC 1/2X4 (GAUZE/BANDAGES/DRESSINGS) ×2 IMPLANT
COVER PERINEAL POST (MISCELLANEOUS) ×2 IMPLANT
COVER SURGICAL LIGHT HANDLE (MISCELLANEOUS) ×2 IMPLANT
COVER WAND RF STERILE (DRAPES) IMPLANT
CUP ACET PINNACLE SECTR 50MM (Hips) IMPLANT
DECANTER SPIKE VIAL GLASS SM (MISCELLANEOUS) ×2 IMPLANT
DRAPE STERI IOBAN 125X83 (DRAPES) ×2 IMPLANT
DRAPE U-SHAPE 47X51 STRL (DRAPES) ×4 IMPLANT
DRSG ADAPTIC 3X8 NADH LF (GAUZE/BANDAGES/DRESSINGS) ×2 IMPLANT
DRSG AQUACEL AG ADV 3.5X10 (GAUZE/BANDAGES/DRESSINGS) ×2 IMPLANT
DURAPREP 26ML APPLICATOR (WOUND CARE) ×2 IMPLANT
ELECT REM PT RETURN 15FT ADLT (MISCELLANEOUS) ×2 IMPLANT
EVACUATOR 1/8 PVC DRAIN (DRAIN) IMPLANT
GLOVE BIO SURGEON STRL SZ 6 (GLOVE) IMPLANT
GLOVE BIO SURGEON STRL SZ7 (GLOVE) IMPLANT
GLOVE BIO SURGEON STRL SZ8 (GLOVE) ×2 IMPLANT
GLOVE BIOGEL PI IND STRL 6.5 (GLOVE) IMPLANT
GLOVE BIOGEL PI IND STRL 7.0 (GLOVE) IMPLANT
GLOVE BIOGEL PI IND STRL 8 (GLOVE) ×1 IMPLANT
GLOVE BIOGEL PI INDICATOR 6.5 (GLOVE)
GLOVE BIOGEL PI INDICATOR 7.0 (GLOVE)
GLOVE BIOGEL PI INDICATOR 8 (GLOVE) ×1
GOWN STRL REUS W/TWL LRG LVL3 (GOWN DISPOSABLE) ×2 IMPLANT
GOWN STRL REUS W/TWL XL LVL3 (GOWN DISPOSABLE) IMPLANT
HEAD FEM STD 32X+5 STRL (Hips) ×1 IMPLANT
HOLDER FOLEY CATH W/STRAP (MISCELLANEOUS) ×2 IMPLANT
KIT TURNOVER KIT A (KITS) IMPLANT
LINER MARATHON 32 50 (Hips) ×1 IMPLANT
MANIFOLD NEPTUNE II (INSTRUMENTS) ×2 IMPLANT
PACK ANTERIOR HIP CUSTOM (KITS) ×2 IMPLANT
PENCIL SMOKE EVACUATOR COATED (MISCELLANEOUS) ×2 IMPLANT
PINNACLE SECTOR CUP 50MM (Hips) ×2 IMPLANT
STEM FEMORAL SZ 5MM STD ACTIS (Stem) ×1 IMPLANT
STRIP CLOSURE SKIN 1/2X4 (GAUZE/BANDAGES/DRESSINGS) ×2 IMPLANT
SUT ETHIBOND NAB CT1 #1 30IN (SUTURE) ×2 IMPLANT
SUT MNCRL AB 4-0 PS2 18 (SUTURE) ×2 IMPLANT
SUT STRATAFIX 0 PDS 27 VIOLET (SUTURE) ×2
SUT VIC AB 2-0 CT1 27 (SUTURE) ×4
SUT VIC AB 2-0 CT1 TAPERPNT 27 (SUTURE) ×2 IMPLANT
SUTURE STRATFX 0 PDS 27 VIOLET (SUTURE) ×1 IMPLANT
SYR 50ML LL SCALE MARK (SYRINGE) IMPLANT
TRAY FOLEY MTR SLVR 16FR STAT (SET/KITS/TRAYS/PACK) ×2 IMPLANT
YANKAUER SUCT BULB TIP 10FT TU (MISCELLANEOUS) ×2 IMPLANT

## 2020-01-22 NOTE — Interval H&P Note (Signed)
History and Physical Interval Note:  01/22/2020 11:00 AM  Carolyn [name]  has presented today for surgery, with the diagnosis of Right hip osteoarthritis.  The various methods of treatment have been discussed with the patient and family. After consideration of risks, benefits and other options for treatment, the patient has consented to  Procedure(s) with comments: Rough and Ready (Right) - 142min as a surgical intervention.  The patient's history has been reviewed, patient examined, no change in status, stable for surgery.  I have reviewed the patient's chart and labs.  Questions were answered to the patient's satisfaction.     Pilar Plate Alpheus Stiff

## 2020-01-22 NOTE — Evaluation (Signed)
Physical Therapy Evaluation Patient Details Name: Carolyn [name] MRN: [id number] DOB: February 10, 1941 Today's Date: 01/22/2020   History of Present Illness  pt s/p R DATHA 01/22/2020. pt with history of L3L4 back surgery 2009 and 2012, and LTKA 2019.  Clinical Impression  Pt is s/p DA  THA resulting in the deficits listed below (see PT Problem List).  Pt will benefit from acute PT to increase their independence and safety with mobility to allow discharge home with family assisting. Tolerated fist session tonight very well. Seemed anxious about how it would go, but tolerated and went well.      Follow Up Recommendations No PT follow up    Equipment Recommendations  None recommended by PT    Recommendations for Other Services       Precautions / Restrictions Precautions Precautions: None Restrictions Weight Bearing Restrictions: No      Mobility  Bed Mobility Overal bed mobility: Needs Assistance Bed Mobility: Supine to Sit     Supine to sit: Min assist     General bed mobility comments: tolerated and helped with UEs and scooting fairly well.  Transfers Overall transfer level: Needs assistance Equipment used: Rolling walker (2 wheeled) Transfers: Sit to/from Stand Sit to Stand: Min guard         General transfer comment: cues for safety with pushing from suface then grabing RW once standing  Ambulation/Gait Ambulation/Gait assistance: Min guard Gait Distance (Feet): 25 Feet (pt just wanted to walk a little in the room. tolerated well, could have gone further) Assistive device: Rolling walker (2 wheeled) Gait Pattern/deviations: Step-to pattern        Stairs            Wheelchair Mobility    Modified Rankin (Stroke Patients Only)       Balance Overall balance assessment: Modified Independent;No apparent balance deficits (not formally assessed)                                           Pertinent Vitals/Pain Pain Assessment: 0-10  Pain Score: 7  Pain Location: later R hip area (states 7/10 howevr did well with all mobility, didn't seem to be in pain during or after. Ice applies. Pt did state she is vry anxious and worried about pain and what it might be later.) Pain Descriptors / Indicators: Sore Pain Intervention(s): Limited activity within patient's tolerance;Monitored during session;Premedicated before session;Ice applied    Home Living Family/patient expects to be discharged to:: Private residence Living Arrangements: Alone (states family and friends will be staying with her until until when she doesn't need them any longer) Available Help at Discharge: Family Type of Home: House Home Access: Stairs to enter   Technical brewer of Steps: 3 Home Layout: One level Home Equipment: Walker - 2 wheels Additional Comments: has a lift chair also.    Prior Function Level of Independence: Independent         Comments: but states since May she has had more pain and had to use a RW and not drive since May.     Hand Dominance        Extremity/Trunk Assessment   Upper Extremity Assessment Upper Extremity Assessment: Overall WFL for tasks assessed    Lower Extremity Assessment Lower Extremity Assessment: Overall WFL for tasks assessed       Communication   Communication: No difficulties  Cognition Arousal/Alertness: Awake/alert  Behavior During Therapy: WFL for tasks assessed/performed Overall Cognitive Status: Within Functional Limits for tasks assessed                                        General Comments      Exercises Total Joint Exercises Ankle Circles/Pumps: AROM;Both;10 reps;Supine Quad Sets: AROM;Supine;Right;5 reps Heel Slides: AROM;Supine;Right;5 reps Hip ABduction/ADduction: AROM;Supine;Right;5 reps   Assessment/Plan    PT Assessment Patient needs continued PT services  PT Problem List Decreased strength;Decreased activity tolerance;Decreased mobility        PT Treatment Interventions DME instruction;Gait training;Stair training;Functional mobility training;Therapeutic activities;Therapeutic exercise    PT Goals (Current goals can be found in the Care Plan section)  Acute Rehab PT Goals Patient Stated Goal: I want to have less pain when I move PT Goal Formulation: With patient Time For Goal Achievement: 02/05/20 Potential to Achieve Goals: Good    Frequency 7X/week   Barriers to discharge        Co-evaluation               AM-PAC PT "6 Clicks" Mobility  Outcome Measure Help needed turning from your back to your side while in a flat bed without using bedrails?: A Little Help needed moving from lying on your back to sitting on the side of a flat bed without using bedrails?: A Little Help needed moving to and from a bed to a chair (including a wheelchair)?: A Little Help needed standing up from a chair using your arms (e.g., wheelchair or bedside chair)?: A Little Help needed to walk in hospital room?: A Little Help needed climbing 3-5 steps with a railing? : A Little 6 Click Score: 18    End of Session Equipment Utilized During Treatment: Gait belt Activity Tolerance: Patient tolerated treatment well (was a bit anxiety and worried about the session, but did well) Patient left: in chair;with chair alarm set;with call bell/phone within reach;with family/visitor present Nurse Communication: Mobility status PT Visit Diagnosis: Other abnormalities of gait and mobility (R26.89)    Time: 1730-1800 PT Time Calculation (min) (ACUTE ONLY): 30 min   Charges:   PT Evaluation $PT Eval Low Complexity: 1 Low PT Treatments $Therapeutic Activity: 8-22 mins        Kathrina Crosley, PT, MPT Acute Rehabilitation Services Office: [phone] Pager: [phone] 01/22/2020   Clide Dales 01/22/2020, 6:13 PM

## 2020-01-22 NOTE — Anesthesia Postprocedure Evaluation (Signed)
Anesthesia Post Note  Patient: Carolyn [name]  Procedure(s) Performed: TOTAL HIP ARTHROPLASTY ANTERIOR APPROACH (Right Hip)     Patient location during evaluation: Nursing Unit Anesthesia Type: Spinal Level of consciousness: oriented and awake and alert Pain management: pain level controlled Vital Signs Assessment: post-procedure vital signs reviewed and stable Respiratory status: spontaneous breathing and respiratory function stable Cardiovascular status: blood pressure returned to baseline and stable Postop Assessment: no headache, no backache, no apparent nausea or vomiting and patient able to bend at knees Anesthetic complications: no   No complications documented.  Last Vitals:  Vitals:   01/22/20 1415 01/22/20 1430  BP: 123/77 140/76  Pulse: 75 76  Resp: 15 16  Temp:    SpO2:      Last Pain:  Vitals:   01/22/20 1101  TempSrc:   PainSc: 8                  Barnet Glasgow

## 2020-01-22 NOTE — Care Plan (Signed)
Ortho Bundle Case Management Note  Patient Details  Name: Carolyn [name] MRN: [id number] Date of Birth: 22-Jun-1941  R THA on 01-22-20 DCP:  Home with family.  1 story home with 3 ste.  DME:  No needs. Has a RW and elevated toilets.  Doesn't want a 3-in-1. PT:  HEP                   DME Arranged:  N/A DME Agency:  NA  HH Arranged:  NA HH Agency:  NA  Additional Comments: Please contact me with any questions of if this plan should need to change.  Marianne Sofia, RN,CCM [facility]  [phone] 01/22/2020, 1:32 PM

## 2020-01-22 NOTE — Anesthesia Procedure Notes (Signed)
Spinal  Patient location during procedure: OR Start time: 01/22/2020 12:05 PM End time: 01/22/2020 12:12 PM Staffing Anesthesiologist: Barnet Glasgow, MD Preanesthetic Checklist Completed: patient identified, IV checked, site marked, risks and benefits discussed, surgical consent, monitors and equipment checked, pre-op evaluation and timeout performed Spinal Block Patient position: sitting Prep: DuraPrep Patient monitoring: heart rate, cardiac monitor, continuous pulse ox and blood pressure Approach: midline Location: L3-4 Injection technique: single-shot Needle Needle type: Sprotte  Needle gauge: 24 G Needle length: 9 cm Needle insertion depth: 7 cm Assessment Sensory level: T4

## 2020-01-22 NOTE — Transfer of Care (Signed)
Immediate Anesthesia Transfer of Care Note  Patient: Carolyn [name]  Procedure(s) Performed: TOTAL HIP ARTHROPLASTY ANTERIOR APPROACH (Right Hip)  Patient Location: PACU  Anesthesia Type:MAC and Spinal  Level of Consciousness: awake, alert , oriented and patient cooperative  Airway & Oxygen Therapy: Patient Spontanous Breathing and Patient connected to face mask oxygen  Post-op Assessment: Report given to RN and Post -op Vital signs reviewed and stable  Post vital signs: Reviewed and stable  Last Vitals:  Vitals Value Taken Time  BP 117/71 01/22/20 1338  Temp    Pulse 77 01/22/20 1342  Resp 19 01/22/20 1342  SpO2 100 % 01/22/20 1342  Vitals shown include unvalidated device data.  Last Pain:  Vitals:   01/22/20 1101  TempSrc:   PainSc: 8       Patients Stated Pain Goal: 5 (16/42/90 [id number])  Complications: No complications documented.

## 2020-01-22 NOTE — Anesthesia Procedure Notes (Addendum)
Procedure Name: MAC Date/Time: 01/22/2020 12:00 PM Performed by: West Pugh, CRNA Pre-anesthesia Checklist: Patient identified, Emergency Drugs available, Suction available, Patient being monitored and Timeout performed Patient Re-evaluated:Patient Re-evaluated prior to induction Oxygen Delivery Method: Simple face mask Preoxygenation: Pre-oxygenation with 100% oxygen Induction Type: IV induction Placement Confirmation: positive ETCO2

## 2020-01-22 NOTE — Op Note (Signed)
OPERATIVE REPORT- TOTAL HIP ARTHROPLASTY   PREOPERATIVE DIAGNOSIS: Osteoarthritis of the Right hip.   POSTOPERATIVE DIAGNOSIS: Osteoarthritis of the Right  hip.   PROCEDURE: Right total hip arthroplasty, anterior approach.   SURGEON: Gaynelle Arabian, MD   ASSISTANT: Griffith Citron, PA-C  ANESTHESIA:  Spinal  ESTIMATED BLOOD LOSS:-400 mL    DRAINS: Hemovac x1.   COMPLICATIONS: None   CONDITION: PACU - hemodynamically stable.   BRIEF CLINICAL NOTE: Carolyn [name] is a 79 y.o. female who has advanced end-  stage arthritis of their Right  hip with progressively worsening pain and  dysfunction.The patient has failed nonoperative management and presents for  total hip arthroplasty.   PROCEDURE IN DETAIL: After successful administration of spinal  anesthetic, the traction boots for the [facility] bed were placed on both  feet and the patient was placed onto the [facility] bed, boots placed into the leg  holders. The Right hip was then isolated from the perineum with plastic  drapes and prepped and draped in the usual sterile fashion. ASIS and  greater trochanter were marked and a oblique incision was made, starting  at about 1 cm lateral and 2 cm distal to the ASIS and coursing towards  the anterior cortex of the femur. The skin was cut with a 10 blade  through subcutaneous tissue to the level of the fascia overlying the  tensor fascia lata muscle. The fascia was then incised in line with the  incision at the junction of the anterior third and posterior 2/3rd. The  muscle was teased off the fascia and then the interval between the TFL  and the rectus was developed. The Hohmann retractor was then placed at  the top of the femoral neck over the capsule. The vessels overlying the  capsule were cauterized and the fat on top of the capsule was removed.  A Hohmann retractor was then placed anterior underneath the rectus  femoris to give exposure to the entire anterior capsule. A T-shaped   capsulotomy was performed. The edges were tagged and the femoral head  was identified.       Osteophytes are removed off the superior acetabulum.  The femoral neck was then cut in situ with an oscillating saw. Traction  was then applied to the left lower extremity utilizing the [facility]  traction. The femoral head was then removed. Retractors were placed  around the acetabulum and then circumferential removal of the labrum was  performed. Osteophytes were also removed. Reaming starts at 47 mm to  medialize and  Increased in 2 mm increments to 49 mm. We reamed in  approximately 40 degrees of abduction, 20 degrees anteversion. A 50 mm  pinnacle acetabular shell was then impacted in anatomic position under  fluoroscopic guidance with excellent purchase. We did not need to place  any additional dome screws. A 32 mm neutral + 4 marathon liner was then  placed into the acetabular shell.       The femoral lift was then placed along the lateral aspect of the femur  just distal to the vastus ridge. The leg was  externally rotated and capsule  was stripped off the inferior aspect of the femoral neck down to the  level of the lesser trochanter, this was done with electrocautery. The femur was lifted after this was performed. The  leg was then placed in an extended and adducted position essentially delivering the femur. We also removed the capsule superiorly and the piriformis from the piriformis  fossa to gain excellent exposure of the  proximal femur. Rongeur was used to remove some cancellous bone to get  into the lateral portion of the proximal femur for placement of the  initial starter reamer. The starter broaches was placed  the starter broach  and was shown to go down the center of the canal. Broaching  with the Actis system was then performed starting at size 0  coursing  Up to size 5. A size 5 had excellent torsional and rotational  and axial stability. The trial standard offset neck was then  placed  with a 32 + 1 trial head. The hip was then reduced. We confirmed that  the stem was in the canal both on AP and lateral x-rays. It also has excellent sizing. The hip was reduced with outstanding stability through full extension and full external rotation.. AP pelvis was taken and the leg lengths were measured and found to be equal. Hip was then dislocated again and the femoral head and neck removed. The  femoral broach was removed. Size 5 Actis stem with a standard offset  neck was then impacted into the femur following native anteversion. Has  excellent purchase in the canal. Excellent torsional and rotational and  axial stability. It is confirmed to be in the canal on AP and lateral  fluoroscopic views. The 32 + 1 metal head was placed and the hip  reduced with outstanding stability. Again AP pelvis was taken and it  confirmed that the leg lengths were equal. The wound was then copiously  irrigated with saline solution and the capsule reattached and repaired  with Ethibond suture. 30 ml of .25% Bupivicaine was  injected into the capsule and into the edge of the tensor fascia lata as well as subcutaneous tissue. The fascia overlying the tensor fascia lata was then closed with a running #1 V-Loc. Subcu was closed with interrupted 2-0 Vicryl and subcuticular running 4-0 Monocryl. Incision was cleaned  and dried. Steri-Strips and a bulky sterile dressing applied. Hemovac  drain was hooked to suction and then the patient was awakened and transported to  recovery in stable condition.        Please note that a surgical assistant was a medical necessity for this procedure to perform it in a safe and expeditious manner. Assistant was necessary to provide appropriate retraction of vital neurovascular structures and to prevent femoral fracture and allow for anatomic placement of the prosthesis.  Gaynelle Arabian, M.D.

## 2020-01-22 NOTE — Discharge Instructions (Addendum)
Carolyn Arabian, MD Total Joint Specialist [facility] [place] [address], [address] [place], [place] [address] [phone]  ANTERIOR APPROACH TOTAL HIP REPLACEMENT POSTOPERATIVE DIRECTIONS     Hip Rehabilitation, Guidelines Following Surgery  The results of a hip operation are greatly improved after range of motion and muscle strengthening exercises. Follow all safety measures which are given to protect your hip. If any of these exercises cause increased pain or swelling in your joint, decrease the amount until you are comfortable again. Then slowly increase the exercises. Call your caregiver if you have problems or questions.   BLOOD CLOT PREVENTION . Take a 325 mg Aspirin two times a day for three weeks following surgery. Then take an 81 mg Aspirin once a day for three weeks. Then discontinue Aspirin. Carolyn [name] may resume your vitamins/supplements upon discharge from the hospital. . Do not take any NSAIDs (Advil, Aleve, Ibuprofen, Meloxicam, etc.) until you have discontinued the 325 mg Aspirin.  METHOTREXATE INSTRUCTIONS . Hold methotrexate for three weeks after surgery. Then resume.  HOME CARE INSTRUCTIONS  . Remove items at home which could result in a fall. This includes throw rugs or furniture in walking pathways.   ICE to the affected hip as frequently as 20-30 minutes an hour and then as needed for pain and swelling. Continue to use ice on the hip for pain and swelling from surgery. You may notice swelling that will progress down to the foot and ankle. This is normal after surgery. Elevate the leg when you are not up walking on it.    Continue to use the breathing machine which will help keep your temperature down.  It is common for your temperature to cycle up and down following surgery, especially at night when you are not up moving around and exerting yourself.  The breathing machine keeps your lungs expanded and your temperature down.  DIET You may resume your  previous home diet once your are discharged from the hospital.  DRESSING / WOUND CARE / SHOWERING . You have an adhesive waterproof bandage over the incision. Leave this in place until your first follow-up appointment. Once you remove this you will not need to place another bandage.  . You may begin showering 3 days following surgery, but do not submerge the incision under water.  ACTIVITY . For the first 3-5 days, it is important to rest and keep the operative leg elevated. You should, as a general rule, rest for 50 minutes and walk/stretch for 10 minutes per hour. After 5 days, you may slowly increase activity as tolerated.  Marland Kitchen Perform the exercises you were provided twice a day for about 15-20 minutes each session. Begin these 2 days following surgery. . Walk with your walker as instructed. Use the walker until you are comfortable transitioning to a cane. Walk with the cane in the opposite hand of the operative leg. You may discontinue the cane once you are comfortable and walking steadily. . Avoid periods of inactivity such as sitting longer than an hour when not asleep. This helps prevent blood clots.  . Do not drive a car for 6 weeks or until released by your surgeon.  . Do not drive while taking narcotics.  TED HOSE STOCKINGS Wear the elastic stockings on both legs for three weeks following surgery during the day. You may remove them at night while sleeping.  WEIGHT BEARING Weight bearing as tolerated with assist device (walker, cane, etc) as directed, use it as long as suggested by your  surgeon or therapist, typically at least 4-6 weeks.  POSTOPERATIVE CONSTIPATION PROTOCOL Constipation - defined medically as fewer than three stools per week and severe constipation as less than one stool per week.  One of the most common issues patients have following surgery is constipation.  Even if you have a regular bowel pattern at home, your normal regimen is likely to be disrupted due to multiple  reasons following surgery.  Combination of anesthesia, postoperative narcotics, change in appetite and fluid intake all can affect your bowels.  In order to avoid complications following surgery, here are some recommendations in order to help you during your recovery period.  . Colace (docusate) - Pick up an over-the-counter form of Colace or another stool softener and take twice a day as long as you are requiring postoperative pain medications.  Take with a full glass of water daily.  If you experience loose stools or diarrhea, hold the colace until you stool forms back up.  If your symptoms do not get better within 1 week or if they get worse, check with your doctor. . Dulcolax (bisacodyl) - Pick up over-the-counter and take as directed by the product packaging as needed to assist with the movement of your bowels.  Take with a full glass of water.  Use this product as needed if not relieved by Colace only.  . MiraLax (polyethylene glycol) - Pick up over-the-counter to have on hand.  MiraLax is a solution that will increase the amount of water in your bowels to assist with bowel movements.  Take as directed and can mix with a glass of water, juice, soda, coffee, or tea.  Take if you go more than two days without a movement.Do not use MiraLax more than once per day. Call your doctor if you are still constipated or irregular after using this medication for 7 days in a row.  If you continue to have problems with postoperative constipation, please contact the office for further assistance and recommendations.  If you experience "the worst abdominal pain ever" or develop nausea or vomiting, please contact the office immediatly for further recommendations for treatment.  ITCHING  If you experience itching with your medications, try taking only a single pain pill, or even half a pain pill at a time.  You can also use Benadryl over the counter for itching or also to help with sleep.   MEDICATIONS See your  medication summary on the "After Visit Summary" that the nursing staff will review with you prior to discharge.  You may have some home medications which will be placed on hold until you complete the course of blood thinner medication.  It is important for you to complete the blood thinner medication as prescribed by your surgeon.  Continue your approved medications as instructed at time of discharge.  PRECAUTIONS If you experience chest pain or shortness of breath - call 911 immediately for transfer to the hospital emergency department.  If you develop a fever greater that 101 F, purulent drainage from wound, increased redness or drainage from wound, foul odor from the wound/dressing, or calf pain - CONTACT YOUR SURGEON.                                                   FOLLOW-UP APPOINTMENTS Make sure you keep all of your appointments after your operation with your  surgeon and caregivers. You should call the office at the above phone number and make an appointment for approximately two weeks after the date of your surgery or on the date instructed by your surgeon outlined in the "After Visit Summary".  RANGE OF MOTION AND STRENGTHENING EXERCISES  These exercises are designed to help you keep full movement of your hip joint. Follow your caregiver's or physical therapist's instructions. Perform all exercises about fifteen times, three times per day or as directed. Exercise both hips, even if you have had only one joint replacement. These exercises can be done on a training (exercise) mat, on the floor, on a table or on a bed. Use whatever works the best and is most comfortable for you. Use music or television while you are exercising so that the exercises are a pleasant break in your day. This will make your life better with the exercises acting as a break in routine you can look forward to.  . Lying on your back, slowly slide your foot toward your buttocks, raising your knee up off the floor. Then slowly  slide your foot back down until your leg is straight again.  . Lying on your back spread your legs as far apart as you can without causing discomfort.  . Lying on your side, raise your upper leg and foot straight up from the floor as far as is comfortable. Slowly lower the leg and repeat.  . Lying on your back, tighten up the muscle in the front of your thigh (quadriceps muscles). You can do this by keeping your leg straight and trying to raise your heel off the floor. This helps strengthen the largest muscle supporting your knee.  . Lying on your back, tighten up the muscles of your buttocks both with the legs straight and with the knee bent at a comfortable angle while keeping your heel on the floor.   IF YOU ARE TRANSFERRED TO A SKILLED REHAB FACILITY If the patient is transferred to a skilled rehab facility following release from the hospital, a list of the current medications will be sent to the facility for the patient to continue.  When discharged from the skilled rehab facility, please have the facility set up the patient's [place] prior to being released. Also, the skilled facility will be responsible for providing the patient with their medications at time of release from the facility to include their pain medication, the muscle relaxants, and their blood thinner medication. If the patient is still at the rehab facility at time of the two week follow up appointment, the skilled rehab facility will also need to assist the patient in arranging follow up appointment in our office and any transportation needs.  MAKE SURE YOU:  . Understand these instructions.  . Get help right away if you are not doing well or get worse.    DENTAL ANTIBIOTICS:  In most cases prophylactic antibiotics for Dental procdeures after total joint surgery are not necessary.  Exceptions are as follows:  1. History of prior total joint infection  2. Severely immunocompromised (Organ Transplant,  cancer chemotherapy, Rheumatoid biologic meds such as Delta)  3. Poorly controlled diabetes (A1C &gt; 8.0, blood glucose over 200)  If you have one of these conditions, contact your surgeon for an antibiotic prescription, prior to your dental procedure.    Pick up stool softner and laxative for home use following surgery while on pain medications. Do not submerge incision under water. Please use good hand washing  techniques while changing dressing each day. May shower starting three days after surgery. Please use a clean towel to pat the incision dry following showers. Continue to use ice for pain and swelling after surgery. Do not use any lotions or creams on the incision until instructed by your surgeon.

## 2020-01-23 ENCOUNTER — Encounter (HOSPITAL_COMMUNITY): Payer: Self-pay | Admitting: Orthopedic Surgery

## 2020-01-23 DIAGNOSIS — I129 Hypertensive chronic kidney disease with stage 1 through stage 4 chronic kidney disease, or unspecified chronic kidney disease: Secondary | ICD-10-CM | POA: Diagnosis not present

## 2020-01-23 DIAGNOSIS — Z79899 Other long term (current) drug therapy: Secondary | ICD-10-CM | POA: Diagnosis not present

## 2020-01-23 DIAGNOSIS — E1122 Type 2 diabetes mellitus with diabetic chronic kidney disease: Secondary | ICD-10-CM | POA: Diagnosis not present

## 2020-01-23 DIAGNOSIS — Z7984 Long term (current) use of oral hypoglycemic drugs: Secondary | ICD-10-CM | POA: Diagnosis not present

## 2020-01-23 DIAGNOSIS — M1611 Unilateral primary osteoarthritis, right hip: Secondary | ICD-10-CM | POA: Diagnosis not present

## 2020-01-23 DIAGNOSIS — K219 Gastro-esophageal reflux disease without esophagitis: Secondary | ICD-10-CM | POA: Diagnosis not present

## 2020-01-23 DIAGNOSIS — M1990 Unspecified osteoarthritis, unspecified site: Secondary | ICD-10-CM | POA: Diagnosis not present

## 2020-01-23 DIAGNOSIS — Z96652 Presence of left artificial knee joint: Secondary | ICD-10-CM | POA: Diagnosis not present

## 2020-01-23 DIAGNOSIS — E210 Primary hyperparathyroidism: Secondary | ICD-10-CM | POA: Diagnosis not present

## 2020-01-23 DIAGNOSIS — M1710 Unilateral primary osteoarthritis, unspecified knee: Secondary | ICD-10-CM | POA: Diagnosis not present

## 2020-01-23 DIAGNOSIS — E785 Hyperlipidemia, unspecified: Secondary | ICD-10-CM | POA: Diagnosis not present

## 2020-01-23 LAB — GLUCOSE, CAPILLARY
Glucose-Capillary: 115 mg/dL — ABNORMAL HIGH (ref 70–99)
Glucose-Capillary: 175 mg/dL — ABNORMAL HIGH (ref 70–99)

## 2020-01-23 LAB — CBC
HCT: 33.5 % — ABNORMAL LOW (ref 36.0–46.0)
Hemoglobin: 10.9 g/dL — ABNORMAL LOW (ref 12.0–15.0)
MCH: 30 pg (ref 26.0–34.0)
MCHC: 32.5 g/dL (ref 30.0–36.0)
MCV: 92.3 fL (ref 80.0–100.0)
Platelets: 230 10*3/uL (ref 150–400)
RBC: 3.63 MIL/uL — ABNORMAL LOW (ref 3.87–5.11)
RDW: 12.2 % (ref 11.5–15.5)
WBC: 8.2 10*3/uL (ref 4.0–10.5)
nRBC: 0 % (ref 0.0–0.2)

## 2020-01-23 LAB — BASIC METABOLIC PANEL
Anion gap: 9 (ref 5–15)
BUN: 15 mg/dL (ref 8–23)
CO2: 28 mmol/L (ref 22–32)
Calcium: 8.9 mg/dL (ref 8.9–10.3)
Chloride: 99 mmol/L (ref 98–111)
Creatinine, Ser: 0.85 mg/dL (ref 0.44–1.00)
GFR calc Af Amer: >60 mL/min (ref >60)
GFR calc non Af Amer: >60 mL/min (ref >60)
Glucose, Bld: 159 mg/dL — ABNORMAL HIGH (ref 70–99)
Potassium: 4.3 mmol/L (ref 3.5–5.1)
Sodium: 136 mmol/L (ref 135–145)

## 2020-01-23 MED ORDER — ASPIRIN 325 MG PO TBEC: 325.0000 mg | DELAYED_RELEASE_TABLET | Freq: Two times a day (BID) | ORAL | 0 refills | Status: AC

## 2020-01-23 MED ORDER — METHOCARBAMOL 500 MG PO TABS: 500.0000 mg | ORAL_TABLET | Freq: Four times a day (QID) | ORAL | 0 refills | Status: DC | PRN

## 2020-01-23 MED ORDER — HYDROCODONE-ACETAMINOPHEN 5-325 MG PO TABS: 1.0000 | ORAL_TABLET | Freq: Four times a day (QID) | ORAL | 0 refills | Status: DC | PRN

## 2020-01-23 NOTE — Progress Notes (Signed)
Physical Therapy Treatment Patient Details Name: Carolyn [name] MRN: [id number] DOB: 1940-08-28 Today's Date: 01/23/2020    History of Present Illness pt s/p R DATHA 01/22/2020. pt with history of L3L4 back surgery 2009 and 2012, and LTKA 2019.    PT Comments    POD # 1 am session Assisted OOB.  General bed mobility comments: demonstarted and instructed how to use a belt to self assist LE.  General Gait Details: shaky and unsteady this morning.  Only amb to and from bathroom.  Mod c/o feeling "hot".  required a seated rest break. General transfer comment: 25% VC's on safety with turns.  Assisted to recliner and performed a few TE's followed by ICE. Pt will need another PT session to progress gait, practice stairs and complete instruction on HEP.   Follow Up Recommendations  No PT follow up (HEP)     Equipment Recommendations       Recommendations for Other Services       Precautions / Restrictions Precautions Precautions: None Restrictions Weight Bearing Restrictions: No Other Position/Activity Restrictions: WBAT    Mobility  Bed Mobility Overal bed mobility: Needs Assistance Bed Mobility: Supine to Sit     Supine to sit: Supervision;Min guard     General bed mobility comments: demonstarted and instructed how to use a belt to self assist LE  Transfers Overall transfer level: Needs assistance Equipment used: Rolling walker (2 wheeled) Transfers: Sit to/from Stand Sit to Stand: Supervision;Min guard         General transfer comment: 25% VC's on safety with turns  Ambulation/Gait Ambulation/Gait assistance: Supervision;Min guard Gait Distance (Feet): 20 Feet (10 feet x 2 to and from bathroom) Assistive device: Rolling walker (2 wheeled) Gait Pattern/deviations: Step-to pattern Gait velocity: decreased   General Gait Details: shaky and unsteady this morning.  Only amb to and from bathroom.  Mod c/o feeling "hot".  required a seated rest break.   Stairs              Wheelchair Mobility    Modified Rankin (Stroke Patients Only)       Balance                                            Cognition   Behavior During Therapy: WFL for tasks assessed/performed Overall Cognitive Status: Within Functional Limits for tasks assessed                                 General Comments: pleasant      Exercises  10 reps AP, knee presses and gluteal sqeezes    General Comments        Pertinent Vitals/Pain Pain Assessment: 0-10 Pain Score: 3  Pain Location: R hip hip Pain Descriptors / Indicators: Tender;Grimacing;Operative site guarding Pain Intervention(s): Monitored during session;Premedicated before session;Ice applied;Repositioned    Home Living                      Prior Function            PT Goals (current goals can now be found in the care plan section) Progress towards PT goals: Progressing toward goals    Frequency    7X/week      PT Plan Current plan remains appropriate    Co-evaluation  AM-PAC PT "6 Clicks" Mobility   Outcome Measure  Help needed turning from your back to your side while in a flat bed without using bedrails?: A Little Help needed moving from lying on your back to sitting on the side of a flat bed without using bedrails?: A Little Help needed moving to and from a bed to a chair (including a wheelchair)?: A Little Help needed standing up from a chair using your arms (e.g., wheelchair or bedside chair)?: A Little Help needed to walk in hospital room?: A Little Help needed climbing 3-5 steps with a railing? : A Little 6 Click Score: 18    End of Session Equipment Utilized During Treatment: Gait belt Activity Tolerance: Patient limited by fatigue Patient left: in chair;with chair alarm set;with call bell/phone within reach Nurse Communication: Mobility status PT Visit Diagnosis: Other abnormalities of gait and mobility (R26.89)      Time: 0945-1010 PT Time Calculation (min) (ACUTE ONLY): 25 min  Charges:  $Gait Training: 8-22 mins $Therapeutic Activity: 8-22 mins                     Rica Koyanagi  PTA Acute  Rehabilitation Services Pager      [phone] Office      [phone]

## 2020-01-23 NOTE — Progress Notes (Signed)
   Subjective: 1 Day Post-Op Procedure(s) (LRB): TOTAL HIP ARTHROPLASTY ANTERIOR APPROACH (Right) Patient reports pain as mild and moderate.   Patient seen in rounds with Dr. Wynelle Link. Patient is well, and has had no acute complaints or problems other than soreness in the right hip. Denies chest pain or SOB. No issues overnight. Foley catheter removed this AM. We will continue therapy today, ambulated 25' yesterday.   Objective: Vital signs in last 24 hours: Temp:  [97.7 F (36.5 C)-98.4 F (36.9 C)] 98.2 F (36.8 C) (06/17 0455) Pulse Rate:  [69-92] 79 (06/17 0455) Resp:  [10-27] 16 (06/17 0455) BP: (105-191)/(68-101) 119/71 (06/17 0455) SpO2:  [96 %-100 %] 99 % (06/17 0455) Weight:  [73.9 kg] 73.9 kg (06/16 1101)  Intake/Output from previous day:  Intake/Output Summary (Last 24 hours) at 01/23/2020 0748 Last data filed at 01/23/2020 0455 Gross per 24 hour  Intake 4825.42 ml  Output 2450 ml  Net 2375.42 ml     Intake/Output this shift: No intake/output data recorded.  Labs: Recent Labs    01/23/20 0315  HGB 10.9*   Recent Labs    01/23/20 0315  WBC 8.2  RBC 3.63*  HCT 33.5*  PLT 230   Recent Labs    01/23/20 0315  NA 136  K 4.3  CL 99  CO2 28  BUN 15  CREATININE 0.85  GLUCOSE 159*  CALCIUM 8.9   No results for input(s): LABPT, INR in the last 72 hours.  Exam: General - Patient is Alert and Oriented Extremity - Neurologically intact Neurovascular intact Sensation intact distally Dorsiflexion/Plantar flexion intact Dressing - dressing C/D/I Motor Function - intact, moving foot and toes well on exam.   Past Medical History:  Diagnosis Date  . Anxiety   . Arthritis    RA  . CKD (chronic kidney disease)    pt. denies  . Complication of anesthesia    " my heart rate gets low"  . Depression   . Depression   . Diabetes mellitus ([place])    type 2  . GERD (gastroesophageal reflux disease)   . Hepatic steatosis   . Hyperlipidemia   .  Hyperparathyroidism, primary ([place])   . Hypertension   . IBS (irritable bowel syndrome)   . Internal hemorrhoid   . OA (osteoarthritis)   . Syncope   . Tubular adenoma of colon 1999   colonoscopy  . Wears dentures   . Wears glasses     Assessment/Plan: 1 Day Post-Op Procedure(s) (LRB): TOTAL HIP ARTHROPLASTY ANTERIOR APPROACH (Right) Principal Problem:   OA (osteoarthritis) of hip Active Problems:   Primary osteoarthritis of right hip  Estimated body mass index is 25.53 kg/m as calculated from the following:   Height as of this encounter: 5\' 7"  (1.702 m).   Weight as of this encounter: 73.9 kg. Advance diet Up with therapy D/C IV fluids  DVT Prophylaxis - Aspirin Weight bearing as tolerated. D/C O2 and pulse ox and try on room air. Continue therapy.  Plan is to go Home after hospital stay. Plan for discharge later today with HEP if meeting goals. Follow-up in the office June 29th.  Theresa Duty, PA-C Orthopedic Surgery [phone] 01/23/2020, 7:48 AM

## 2020-01-23 NOTE — Plan of Care (Signed)
Patient discharged home in stable condition 

## 2020-01-23 NOTE — Progress Notes (Signed)
Physical Therapy Treatment Patient Details Name: Carolyn [name] MRN: [id number] DOB: July 24, 1941 Today's Date: 01/23/2020    History of Present Illness pt s/p R DATHA 01/22/2020. pt with history of L3L4 back surgery 2009 and 2012, and LTKA 2019.    PT Comments    POD # 1 pm session Assisted with amb in hallway an increased distance, practiced stairs Then returned to room to perform some TE's following HEP handout.  Instructed on proper tech, freq as well as use of ICE.   Addressed all mobility questions, discussed appropriate activity, educated on use of ICE.  Pt ready for D/C to home.   Follow Up Recommendations  No PT follow up (HEP)     Equipment Recommendations       Recommendations for Other Services       Precautions / Restrictions Precautions Precautions: None Restrictions Weight Bearing Restrictions: No Other Position/Activity Restrictions: WBAT    Mobility  Bed Mobility   General bed mobility comments: OOB in recliner  Transfers Overall transfer level: Needs assistance Equipment used: Rolling walker (2 wheeled) Transfers: Sit to/from Stand Sit to Stand: Supervision;Min guard         General transfer comment: 25% VC's on safety with turns  Ambulation/Gait Ambulation/Gait assistance: Supervision;Min guard Gait Distance (Feet): 45 Feet Assistive device: Rolling walker (2 wheeled) Gait Pattern/deviations: Step-to pattern Gait velocity: decreased   General Gait Details: shaky and unsteady this morning.  Only amb to and from bathroom.  Mod c/o feeling "hot".  required a seated rest break.   Stairs Stairs: Yes Stairs assistance: Supervision;Min guard Stair Management: Two rails Number of Stairs: 2 General stair comments: 50% VC's on proper sequencing and safety.   Wheelchair Mobility    Modified Rankin (Stroke Patients Only)       Balance                                            Cognition   Behavior During Therapy: WFL  for tasks assessed/performed Overall Cognitive Status: Within Functional Limits for tasks assessed                                 General Comments: pleasant      Exercises   Total Hip Replacement TE's following HEP Handout 10 reps ankle pumps 05 reps knee presses 05 reps heel slides 05 reps SAQ's 05 reps ABD Instructed how to use a belt loop to assist  Followed by ICE     General Comments        Pertinent Vitals/Pain Pain Assessment: 0-10 Pain Score: 3  Pain Location: R hip hip Pain Descriptors / Indicators: Tender;Grimacing;Operative site guarding Pain Intervention(s): Monitored during session;Premedicated before session;Ice applied;Repositioned    Home Living                      Prior Function            PT Goals (current goals can now be found in the care plan section) Progress towards PT goals: Progressing toward goals    Frequency    7X/week      PT Plan Current plan remains appropriate    Co-evaluation              AM-PAC PT "6 Clicks" Mobility   Outcome Measure  Help needed turning from your back to your side while in a flat bed without using bedrails?: A Little Help needed moving from lying on your back to sitting on the side of a flat bed without using bedrails?: A Little Help needed moving to and from a bed to a chair (including a wheelchair)?: A Little Help needed standing up from a chair using your arms (e.g., wheelchair or bedside chair)?: A Little Help needed to walk in hospital room?: A Little Help needed climbing 3-5 steps with a railing? : A Little 6 Click Score: 18    End of Session Equipment Utilized During Treatment: Gait belt Activity Tolerance: Patient limited by fatigue Patient left: in chair;with chair alarm set;with call bell/phone within reach Nurse Communication: Mobility status PT Visit Diagnosis: Other abnormalities of gait and mobility (R26.89)     Time: 0931-1216 PT Time Calculation  (min) (ACUTE ONLY): 25 min  Charges:  $Gait Training: 8-22 mins $Therapeutic Exercise: 8-22 mins                      Rica Koyanagi  PTA Acute  Rehabilitation Services Pager      [phone] Office      [phone]

## 2020-01-23 NOTE — TOC Transition Note (Signed)
Transition of Care [facility]) - CM/SW Discharge Note   Patient Details  Name: Carolyn [name] MRN: [id number] Date of Birth: July 09, 1941  [facility] of [facility]) CM/SW Contact:  Lia Hopping, [place] Phone Number: 01/23/2020, 10:47 AM   Clinical Narrative:    [facility] Plan of Care, see note.     Final next level of care: Home/Self Care Barriers to Discharge: No Barriers Identified   Patient Goals and CMS Choice     Choice offered to / list presented to : NA  Discharge Placement                       Discharge Plan and Services                DME Arranged: N/A DME Agency: NA       HH Arranged: NA HH Agency: NA        Social Determinants of Health (SDOH) Interventions     Readmission Risk Interventions No flowsheet data found.

## 2020-02-17 DIAGNOSIS — J45901 Unspecified asthma with (acute) exacerbation: Secondary | ICD-10-CM | POA: Diagnosis not present

## 2020-02-17 DIAGNOSIS — R05 Cough: Secondary | ICD-10-CM | POA: Diagnosis not present

## 2020-02-17 DIAGNOSIS — D519 Vitamin B12 deficiency anemia, unspecified: Secondary | ICD-10-CM | POA: Diagnosis not present

## 2020-02-19 DIAGNOSIS — M2550 Pain in unspecified joint: Secondary | ICD-10-CM | POA: Diagnosis not present

## 2020-02-19 DIAGNOSIS — M0609 Rheumatoid arthritis without rheumatoid factor, multiple sites: Secondary | ICD-10-CM | POA: Diagnosis not present

## 2020-02-19 DIAGNOSIS — Z6824 Body mass index (BMI) 24.0-24.9, adult: Secondary | ICD-10-CM | POA: Diagnosis not present

## 2020-02-19 DIAGNOSIS — M150 Primary generalized (osteo)arthritis: Secondary | ICD-10-CM | POA: Diagnosis not present

## 2020-02-20 DIAGNOSIS — K2100 Gastro-esophageal reflux disease with esophagitis, without bleeding: Secondary | ICD-10-CM | POA: Diagnosis not present

## 2020-02-25 DIAGNOSIS — M1711 Unilateral primary osteoarthritis, right knee: Secondary | ICD-10-CM | POA: Diagnosis not present

## 2020-02-25 DIAGNOSIS — Z96641 Presence of right artificial hip joint: Secondary | ICD-10-CM | POA: Diagnosis not present

## 2020-03-02 DIAGNOSIS — E113292 Type 2 diabetes mellitus with mild nonproliferative diabetic retinopathy without macular edema, left eye: Secondary | ICD-10-CM | POA: Diagnosis not present

## 2020-03-02 DIAGNOSIS — H2513 Age-related nuclear cataract, bilateral: Secondary | ICD-10-CM | POA: Diagnosis not present

## 2020-03-02 DIAGNOSIS — H5203 Hypermetropia, bilateral: Secondary | ICD-10-CM | POA: Diagnosis not present

## 2020-03-02 DIAGNOSIS — H25013 Cortical age-related cataract, bilateral: Secondary | ICD-10-CM | POA: Diagnosis not present

## 2020-03-27 DIAGNOSIS — E1122 Type 2 diabetes mellitus with diabetic chronic kidney disease: Secondary | ICD-10-CM | POA: Diagnosis not present

## 2020-03-27 DIAGNOSIS — I10 Essential (primary) hypertension: Secondary | ICD-10-CM | POA: Diagnosis not present

## 2020-03-27 DIAGNOSIS — E1165 Type 2 diabetes mellitus with hyperglycemia: Secondary | ICD-10-CM | POA: Diagnosis not present

## 2020-03-27 DIAGNOSIS — E1129 Type 2 diabetes mellitus with other diabetic kidney complication: Secondary | ICD-10-CM | POA: Diagnosis not present

## 2020-03-27 DIAGNOSIS — E782 Mixed hyperlipidemia: Secondary | ICD-10-CM | POA: Diagnosis not present

## 2020-03-30 ENCOUNTER — Ambulatory Visit: Payer: Medicare Other | Admitting: Family Medicine

## 2020-03-31 DIAGNOSIS — K219 Gastro-esophageal reflux disease without esophagitis: Secondary | ICD-10-CM | POA: Diagnosis not present

## 2020-03-31 DIAGNOSIS — I10 Essential (primary) hypertension: Secondary | ICD-10-CM | POA: Diagnosis not present

## 2020-03-31 DIAGNOSIS — R2689 Other abnormalities of gait and mobility: Secondary | ICD-10-CM | POA: Diagnosis not present

## 2020-03-31 DIAGNOSIS — E1122 Type 2 diabetes mellitus with diabetic chronic kidney disease: Secondary | ICD-10-CM | POA: Diagnosis not present

## 2020-03-31 DIAGNOSIS — E782 Mixed hyperlipidemia: Secondary | ICD-10-CM | POA: Diagnosis not present

## 2020-04-15 DIAGNOSIS — M545 Low back pain: Secondary | ICD-10-CM | POA: Diagnosis not present

## 2020-04-27 DIAGNOSIS — M0609 Rheumatoid arthritis without rheumatoid factor, multiple sites: Secondary | ICD-10-CM | POA: Diagnosis not present

## 2020-04-27 DIAGNOSIS — M150 Primary generalized (osteo)arthritis: Secondary | ICD-10-CM | POA: Diagnosis not present

## 2020-04-27 DIAGNOSIS — M2550 Pain in unspecified joint: Secondary | ICD-10-CM | POA: Diagnosis not present

## 2020-05-04 DIAGNOSIS — H3552 Pigmentary retinal dystrophy: Secondary | ICD-10-CM | POA: Diagnosis not present

## 2020-05-04 DIAGNOSIS — E113293 Type 2 diabetes mellitus with mild nonproliferative diabetic retinopathy without macular edema, bilateral: Secondary | ICD-10-CM | POA: Diagnosis not present

## 2020-05-04 DIAGNOSIS — H35033 Hypertensive retinopathy, bilateral: Secondary | ICD-10-CM | POA: Diagnosis not present

## 2020-05-04 DIAGNOSIS — H43813 Vitreous degeneration, bilateral: Secondary | ICD-10-CM | POA: Diagnosis not present

## 2020-05-14 ENCOUNTER — Other Ambulatory Visit: Payer: Self-pay | Admitting: Family Medicine

## 2020-05-14 DIAGNOSIS — Z1231 Encounter for screening mammogram for malignant neoplasm of breast: Secondary | ICD-10-CM

## 2020-05-21 ENCOUNTER — Ambulatory Visit
Admission: RE | Admit: 2020-05-21 | Discharge: 2020-05-21 | Disposition: A | Discharge status: Home / Self Care | Payer: Medicare Other | Source: Ambulatory Visit | Attending: Family Medicine | Admitting: Family Medicine

## 2020-05-21 ENCOUNTER — Other Ambulatory Visit: Payer: Self-pay

## 2020-05-21 DIAGNOSIS — Z1231 Encounter for screening mammogram for malignant neoplasm of breast: Secondary | ICD-10-CM | POA: Diagnosis not present

## 2020-05-26 DIAGNOSIS — M069 Rheumatoid arthritis, unspecified: Secondary | ICD-10-CM | POA: Diagnosis not present

## 2020-05-26 DIAGNOSIS — Z23 Encounter for immunization: Secondary | ICD-10-CM | POA: Diagnosis not present

## 2020-05-26 DIAGNOSIS — M19041 Primary osteoarthritis, right hand: Secondary | ICD-10-CM | POA: Diagnosis not present

## 2020-06-15 DIAGNOSIS — R300 Dysuria: Secondary | ICD-10-CM | POA: Diagnosis not present

## 2020-06-19 DIAGNOSIS — R300 Dysuria: Secondary | ICD-10-CM | POA: Diagnosis not present

## 2020-06-23 DIAGNOSIS — R1031 Right lower quadrant pain: Secondary | ICD-10-CM | POA: Diagnosis not present

## 2020-06-23 DIAGNOSIS — R1032 Left lower quadrant pain: Secondary | ICD-10-CM | POA: Diagnosis not present

## 2020-06-24 ENCOUNTER — Other Ambulatory Visit: Payer: Self-pay | Admitting: Physician Assistant

## 2020-06-24 DIAGNOSIS — R1032 Left lower quadrant pain: Secondary | ICD-10-CM

## 2020-06-24 DIAGNOSIS — R102 Pelvic and perineal pain: Secondary | ICD-10-CM

## 2020-06-24 DIAGNOSIS — R1031 Right lower quadrant pain: Secondary | ICD-10-CM

## 2020-06-26 IMAGING — RF DG HIP (WITH PELVIS) OPERATIVE*R*
1 series · 2 of 2 positions shown · non-contrast
Comparison: None.

CLINICAL DATA: Right hip replacement

EXAM:
OPERATIVE RIGHT HIP WITH PELVIS

[Series 1: unknown protocol · 0.20mm/px · 2 of 2 slices shown]
[im 1/2]
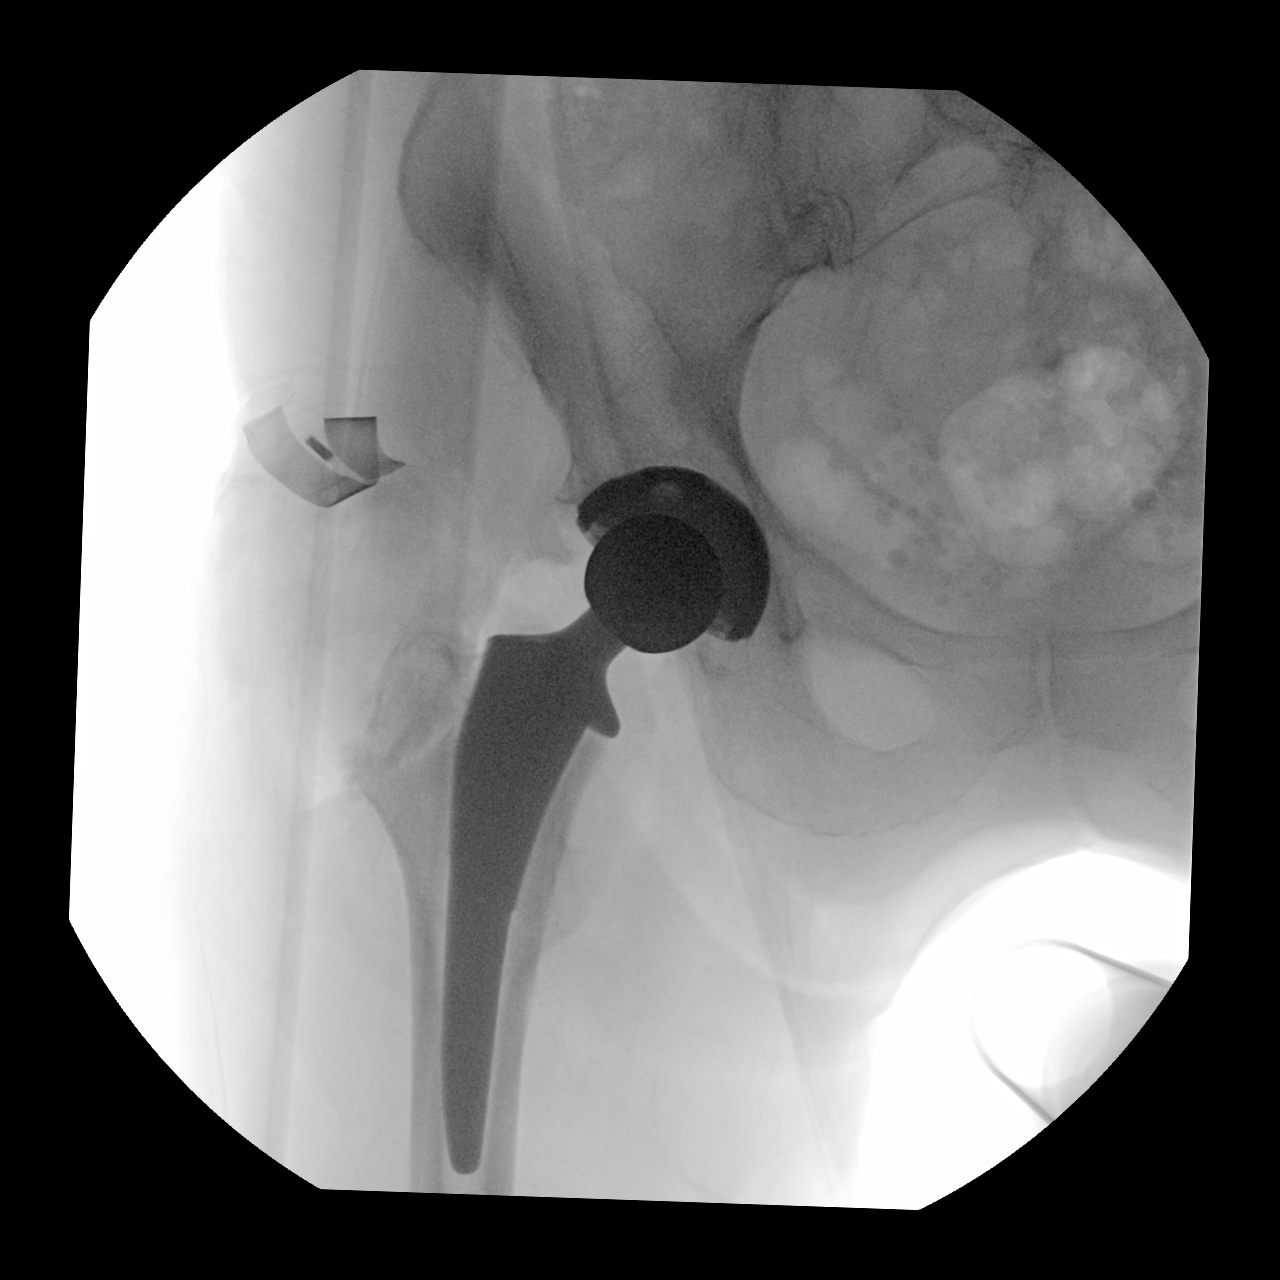
[im 2/2]
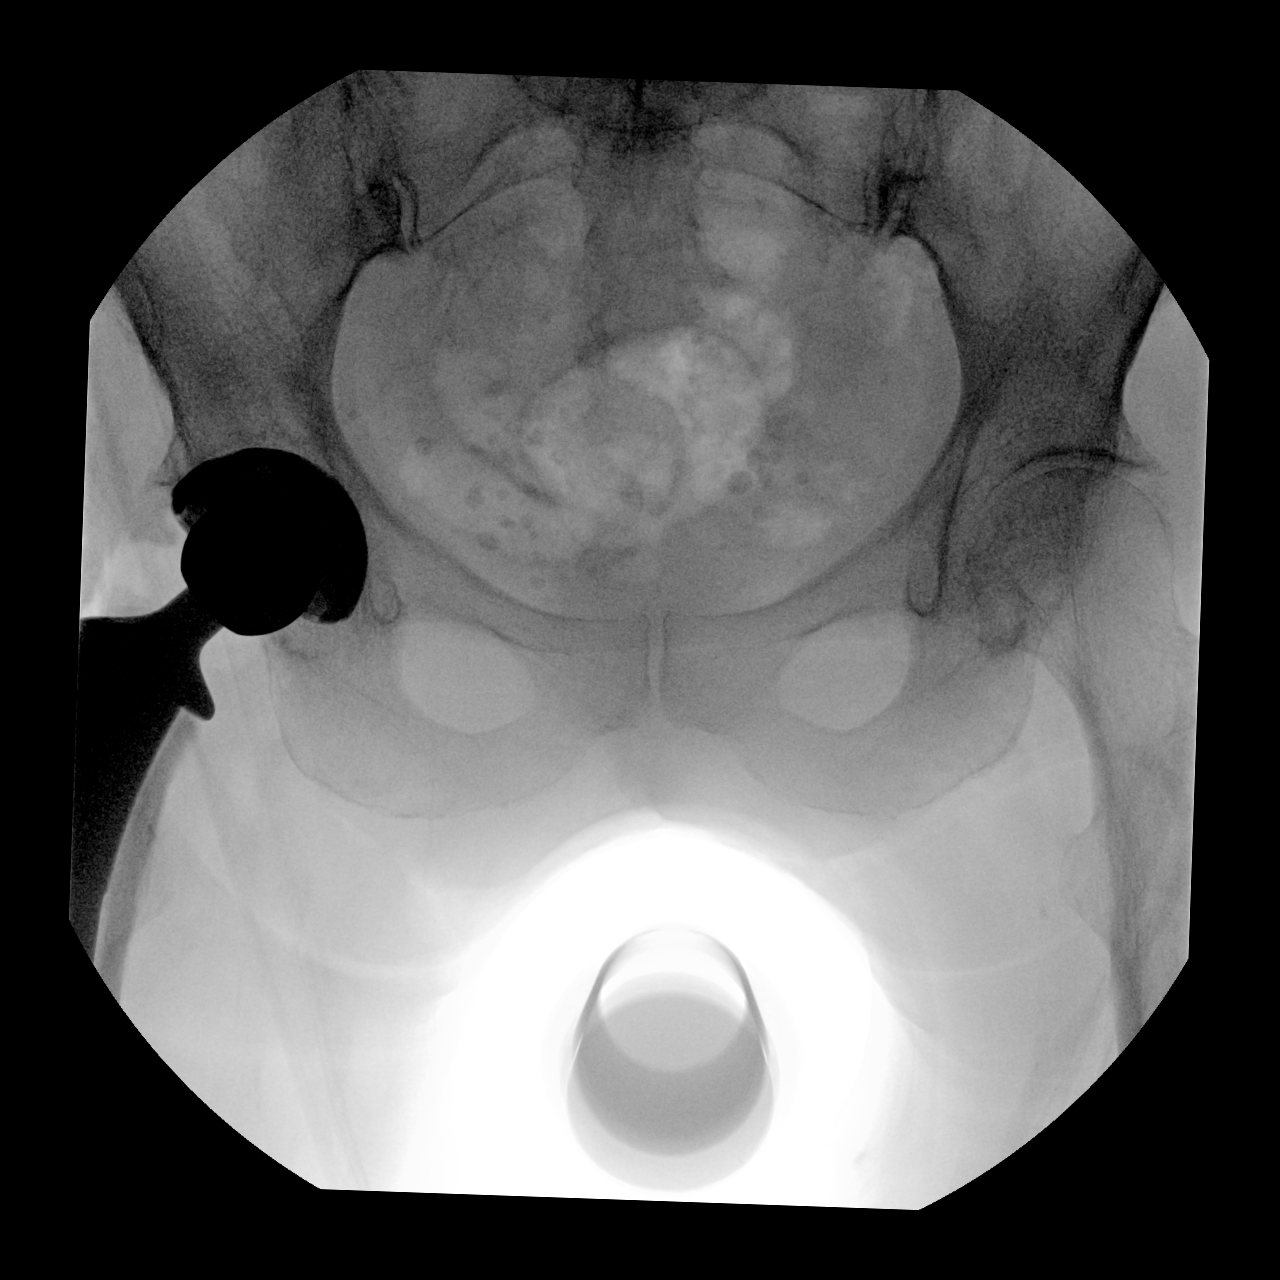

[2 of 2 positions shown; findings below may reference images not displayed]

FLUOROSCOPY TIME:  Radiation Exposure Index (as provided by the
fluoroscopic device): 0.93 mGy

If the device does not provide the exposure index:

Fluoroscopy Time:  9 seconds

Number of Acquired Images:  2
FINDINGS: Right hip prosthesis is noted in satisfactory position. No acute
bony or soft tissue abnormality is noted.
IMPRESSION: Status post right hip replacement.

## 2020-07-01 ENCOUNTER — Other Ambulatory Visit: Payer: Self-pay

## 2020-07-01 ENCOUNTER — Ambulatory Visit
Admission: RE | Admit: 2020-07-01 | Discharge: 2020-07-01 | Disposition: A | Discharge status: Home / Self Care | Payer: Medicare Other | Source: Ambulatory Visit | Attending: Physician Assistant | Admitting: Physician Assistant

## 2020-07-01 DIAGNOSIS — R1031 Right lower quadrant pain: Secondary | ICD-10-CM

## 2020-07-01 DIAGNOSIS — I700 Atherosclerosis of aorta: Secondary | ICD-10-CM | POA: Diagnosis not present

## 2020-07-01 DIAGNOSIS — R102 Pelvic and perineal pain: Secondary | ICD-10-CM

## 2020-07-01 DIAGNOSIS — K5730 Diverticulosis of large intestine without perforation or abscess without bleeding: Secondary | ICD-10-CM | POA: Diagnosis not present

## 2020-07-01 DIAGNOSIS — K5900 Constipation, unspecified: Secondary | ICD-10-CM | POA: Diagnosis not present

## 2020-07-01 DIAGNOSIS — R1032 Left lower quadrant pain: Secondary | ICD-10-CM

## 2020-07-01 MED ORDER — IOPAMIDOL (ISOVUE-300) INJECTION 61%
100.0000 mL | Freq: Once | INTRAVENOUS | Status: AC | PRN
  Administered 2020-07-01: 100 mL via INTRAVENOUS

## 2020-07-08 DIAGNOSIS — M25572 Pain in left ankle and joints of left foot: Secondary | ICD-10-CM | POA: Diagnosis not present

## 2020-07-24 DIAGNOSIS — I10 Essential (primary) hypertension: Secondary | ICD-10-CM | POA: Diagnosis not present

## 2020-07-24 DIAGNOSIS — E1165 Type 2 diabetes mellitus with hyperglycemia: Secondary | ICD-10-CM | POA: Diagnosis not present

## 2020-07-24 DIAGNOSIS — N183 Chronic kidney disease, stage 3 unspecified: Secondary | ICD-10-CM | POA: Diagnosis not present

## 2020-07-24 DIAGNOSIS — E1122 Type 2 diabetes mellitus with diabetic chronic kidney disease: Secondary | ICD-10-CM | POA: Diagnosis not present

## 2020-07-24 DIAGNOSIS — K219 Gastro-esophageal reflux disease without esophagitis: Secondary | ICD-10-CM | POA: Diagnosis not present

## 2020-07-27 DIAGNOSIS — M150 Primary generalized (osteo)arthritis: Secondary | ICD-10-CM | POA: Diagnosis not present

## 2020-07-27 DIAGNOSIS — M0609 Rheumatoid arthritis without rheumatoid factor, multiple sites: Secondary | ICD-10-CM | POA: Diagnosis not present

## 2020-07-27 DIAGNOSIS — M2550 Pain in unspecified joint: Secondary | ICD-10-CM | POA: Diagnosis not present

## 2020-07-29 DIAGNOSIS — E782 Mixed hyperlipidemia: Secondary | ICD-10-CM | POA: Diagnosis not present

## 2020-07-29 DIAGNOSIS — I10 Essential (primary) hypertension: Secondary | ICD-10-CM | POA: Diagnosis not present

## 2020-07-29 DIAGNOSIS — E1122 Type 2 diabetes mellitus with diabetic chronic kidney disease: Secondary | ICD-10-CM | POA: Diagnosis not present

## 2020-07-29 DIAGNOSIS — M069 Rheumatoid arthritis, unspecified: Secondary | ICD-10-CM | POA: Diagnosis not present

## 2020-07-29 DIAGNOSIS — R4582 Worries: Secondary | ICD-10-CM | POA: Diagnosis not present

## 2020-09-07 DIAGNOSIS — H43813 Vitreous degeneration, bilateral: Secondary | ICD-10-CM | POA: Diagnosis not present

## 2020-09-07 DIAGNOSIS — H3552 Pigmentary retinal dystrophy: Secondary | ICD-10-CM | POA: Diagnosis not present

## 2020-09-07 DIAGNOSIS — H35033 Hypertensive retinopathy, bilateral: Secondary | ICD-10-CM | POA: Diagnosis not present

## 2020-09-07 DIAGNOSIS — H35372 Puckering of macula, left eye: Secondary | ICD-10-CM | POA: Diagnosis not present

## 2020-09-07 DIAGNOSIS — E113293 Type 2 diabetes mellitus with mild nonproliferative diabetic retinopathy without macular edema, bilateral: Secondary | ICD-10-CM | POA: Diagnosis not present

## 2020-09-17 DIAGNOSIS — M79672 Pain in left foot: Secondary | ICD-10-CM | POA: Diagnosis not present

## 2020-09-17 DIAGNOSIS — M1711 Unilateral primary osteoarthritis, right knee: Secondary | ICD-10-CM | POA: Diagnosis not present

## 2020-09-17 DIAGNOSIS — M79671 Pain in right foot: Secondary | ICD-10-CM | POA: Diagnosis not present

## 2020-09-17 DIAGNOSIS — Z6828 Body mass index (BMI) 28.0-28.9, adult: Secondary | ICD-10-CM | POA: Diagnosis not present

## 2020-10-20 DIAGNOSIS — M2550 Pain in unspecified joint: Secondary | ICD-10-CM | POA: Diagnosis not present

## 2020-10-20 DIAGNOSIS — Z6826 Body mass index (BMI) 26.0-26.9, adult: Secondary | ICD-10-CM | POA: Diagnosis not present

## 2020-10-20 DIAGNOSIS — M150 Primary generalized (osteo)arthritis: Secondary | ICD-10-CM | POA: Diagnosis not present

## 2020-10-20 DIAGNOSIS — M0609 Rheumatoid arthritis without rheumatoid factor, multiple sites: Secondary | ICD-10-CM | POA: Diagnosis not present

## 2020-10-20 DIAGNOSIS — E663 Overweight: Secondary | ICD-10-CM | POA: Diagnosis not present

## 2020-11-02 DIAGNOSIS — D518 Other vitamin B12 deficiency anemias: Secondary | ICD-10-CM | POA: Diagnosis not present

## 2020-11-02 DIAGNOSIS — E1122 Type 2 diabetes mellitus with diabetic chronic kidney disease: Secondary | ICD-10-CM | POA: Diagnosis not present

## 2020-11-02 DIAGNOSIS — R5383 Other fatigue: Secondary | ICD-10-CM | POA: Diagnosis not present

## 2020-11-02 DIAGNOSIS — D519 Vitamin B12 deficiency anemia, unspecified: Secondary | ICD-10-CM | POA: Diagnosis not present

## 2020-11-02 DIAGNOSIS — M069 Rheumatoid arthritis, unspecified: Secondary | ICD-10-CM | POA: Diagnosis not present

## 2020-11-02 DIAGNOSIS — D508 Other iron deficiency anemias: Secondary | ICD-10-CM | POA: Diagnosis not present

## 2020-11-02 DIAGNOSIS — Z6827 Body mass index (BMI) 27.0-27.9, adult: Secondary | ICD-10-CM | POA: Diagnosis not present

## 2020-11-24 DIAGNOSIS — H6121 Impacted cerumen, right ear: Secondary | ICD-10-CM | POA: Diagnosis not present

## 2020-11-24 DIAGNOSIS — H9201 Otalgia, right ear: Secondary | ICD-10-CM | POA: Diagnosis not present

## 2020-12-01 DIAGNOSIS — K2100 Gastro-esophageal reflux disease with esophagitis, without bleeding: Secondary | ICD-10-CM | POA: Diagnosis not present

## 2020-12-01 DIAGNOSIS — E782 Mixed hyperlipidemia: Secondary | ICD-10-CM | POA: Diagnosis not present

## 2020-12-01 DIAGNOSIS — Z1329 Encounter for screening for other suspected endocrine disorder: Secondary | ICD-10-CM | POA: Diagnosis not present

## 2020-12-01 DIAGNOSIS — N183 Chronic kidney disease, stage 3 unspecified: Secondary | ICD-10-CM | POA: Diagnosis not present

## 2020-12-01 DIAGNOSIS — E7849 Other hyperlipidemia: Secondary | ICD-10-CM | POA: Diagnosis not present

## 2020-12-01 DIAGNOSIS — E1122 Type 2 diabetes mellitus with diabetic chronic kidney disease: Secondary | ICD-10-CM | POA: Diagnosis not present

## 2020-12-08 DIAGNOSIS — M25561 Pain in right knee: Secondary | ICD-10-CM | POA: Diagnosis not present

## 2020-12-08 DIAGNOSIS — Z96641 Presence of right artificial hip joint: Secondary | ICD-10-CM | POA: Diagnosis not present

## 2020-12-16 DIAGNOSIS — E7849 Other hyperlipidemia: Secondary | ICD-10-CM | POA: Diagnosis not present

## 2020-12-16 DIAGNOSIS — Z0001 Encounter for general adult medical examination with abnormal findings: Secondary | ICD-10-CM | POA: Diagnosis not present

## 2020-12-16 DIAGNOSIS — I700 Atherosclerosis of aorta: Secondary | ICD-10-CM | POA: Diagnosis not present

## 2020-12-16 DIAGNOSIS — M069 Rheumatoid arthritis, unspecified: Secondary | ICD-10-CM | POA: Diagnosis not present

## 2020-12-16 DIAGNOSIS — E1122 Type 2 diabetes mellitus with diabetic chronic kidney disease: Secondary | ICD-10-CM | POA: Diagnosis not present

## 2020-12-16 DIAGNOSIS — R2689 Other abnormalities of gait and mobility: Secondary | ICD-10-CM | POA: Diagnosis not present

## 2020-12-16 DIAGNOSIS — R69 Illness, unspecified: Secondary | ICD-10-CM | POA: Diagnosis not present

## 2020-12-16 DIAGNOSIS — R4582 Worries: Secondary | ICD-10-CM | POA: Diagnosis not present

## 2020-12-16 DIAGNOSIS — I10 Essential (primary) hypertension: Secondary | ICD-10-CM | POA: Diagnosis not present

## 2021-01-08 DIAGNOSIS — M25572 Pain in left ankle and joints of left foot: Secondary | ICD-10-CM | POA: Diagnosis not present

## 2021-01-08 DIAGNOSIS — M25571 Pain in right ankle and joints of right foot: Secondary | ICD-10-CM | POA: Diagnosis not present

## 2021-01-20 DIAGNOSIS — E113292 Type 2 diabetes mellitus with mild nonproliferative diabetic retinopathy without macular edema, left eye: Secondary | ICD-10-CM | POA: Diagnosis not present

## 2021-01-20 DIAGNOSIS — H2513 Age-related nuclear cataract, bilateral: Secondary | ICD-10-CM | POA: Diagnosis not present

## 2021-01-20 DIAGNOSIS — H5203 Hypermetropia, bilateral: Secondary | ICD-10-CM | POA: Diagnosis not present

## 2021-01-20 DIAGNOSIS — H25013 Cortical age-related cataract, bilateral: Secondary | ICD-10-CM | POA: Diagnosis not present

## 2021-01-25 DIAGNOSIS — Z6825 Body mass index (BMI) 25.0-25.9, adult: Secondary | ICD-10-CM | POA: Diagnosis not present

## 2021-01-25 DIAGNOSIS — M150 Primary generalized (osteo)arthritis: Secondary | ICD-10-CM | POA: Diagnosis not present

## 2021-01-25 DIAGNOSIS — M0609 Rheumatoid arthritis without rheumatoid factor, multiple sites: Secondary | ICD-10-CM | POA: Diagnosis not present

## 2021-01-25 DIAGNOSIS — M2550 Pain in unspecified joint: Secondary | ICD-10-CM | POA: Diagnosis not present

## 2021-01-25 DIAGNOSIS — E663 Overweight: Secondary | ICD-10-CM | POA: Diagnosis not present

## 2021-02-02 DIAGNOSIS — R55 Syncope and collapse: Secondary | ICD-10-CM | POA: Diagnosis not present

## 2021-02-02 DIAGNOSIS — Z6827 Body mass index (BMI) 27.0-27.9, adult: Secondary | ICD-10-CM | POA: Diagnosis not present

## 2021-02-10 DIAGNOSIS — R42 Dizziness and giddiness: Secondary | ICD-10-CM | POA: Diagnosis not present

## 2021-02-10 DIAGNOSIS — D519 Vitamin B12 deficiency anemia, unspecified: Secondary | ICD-10-CM | POA: Diagnosis not present

## 2021-02-10 DIAGNOSIS — I10 Essential (primary) hypertension: Secondary | ICD-10-CM | POA: Diagnosis not present

## 2021-02-10 DIAGNOSIS — Z6827 Body mass index (BMI) 27.0-27.9, adult: Secondary | ICD-10-CM | POA: Diagnosis not present

## 2021-02-10 DIAGNOSIS — E1122 Type 2 diabetes mellitus with diabetic chronic kidney disease: Secondary | ICD-10-CM | POA: Diagnosis not present

## 2021-02-10 DIAGNOSIS — N183 Chronic kidney disease, stage 3 unspecified: Secondary | ICD-10-CM | POA: Diagnosis not present

## 2021-02-15 DIAGNOSIS — L03115 Cellulitis of right lower limb: Secondary | ICD-10-CM | POA: Diagnosis not present

## 2021-02-15 DIAGNOSIS — Z6827 Body mass index (BMI) 27.0-27.9, adult: Secondary | ICD-10-CM | POA: Diagnosis not present

## 2021-03-01 DIAGNOSIS — H3552 Pigmentary retinal dystrophy: Secondary | ICD-10-CM | POA: Diagnosis not present

## 2021-03-01 DIAGNOSIS — E113293 Type 2 diabetes mellitus with mild nonproliferative diabetic retinopathy without macular edema, bilateral: Secondary | ICD-10-CM | POA: Diagnosis not present

## 2021-03-01 DIAGNOSIS — H43813 Vitreous degeneration, bilateral: Secondary | ICD-10-CM | POA: Diagnosis not present

## 2021-03-01 DIAGNOSIS — H35033 Hypertensive retinopathy, bilateral: Secondary | ICD-10-CM | POA: Diagnosis not present

## 2021-03-31 DIAGNOSIS — M1711 Unilateral primary osteoarthritis, right knee: Secondary | ICD-10-CM | POA: Diagnosis not present

## 2021-04-11 DIAGNOSIS — L03211 Cellulitis of face: Secondary | ICD-10-CM | POA: Diagnosis not present

## 2021-04-12 DIAGNOSIS — L03211 Cellulitis of face: Secondary | ICD-10-CM | POA: Diagnosis not present

## 2021-04-13 DIAGNOSIS — H52223 Regular astigmatism, bilateral: Secondary | ICD-10-CM | POA: Diagnosis not present

## 2021-04-13 DIAGNOSIS — H524 Presbyopia: Secondary | ICD-10-CM | POA: Diagnosis not present

## 2021-04-14 DIAGNOSIS — R519 Headache, unspecified: Secondary | ICD-10-CM | POA: Diagnosis not present

## 2021-04-14 DIAGNOSIS — R6884 Jaw pain: Secondary | ICD-10-CM | POA: Diagnosis not present

## 2021-04-14 DIAGNOSIS — R21 Rash and other nonspecific skin eruption: Secondary | ICD-10-CM | POA: Diagnosis not present

## 2021-04-17 DIAGNOSIS — Z20828 Contact with and (suspected) exposure to other viral communicable diseases: Secondary | ICD-10-CM | POA: Diagnosis not present

## 2021-04-17 DIAGNOSIS — R21 Rash and other nonspecific skin eruption: Secondary | ICD-10-CM | POA: Diagnosis not present

## 2021-04-19 DIAGNOSIS — R21 Rash and other nonspecific skin eruption: Secondary | ICD-10-CM | POA: Diagnosis not present

## 2021-04-19 DIAGNOSIS — Z6827 Body mass index (BMI) 27.0-27.9, adult: Secondary | ICD-10-CM | POA: Diagnosis not present

## 2021-04-19 DIAGNOSIS — E7849 Other hyperlipidemia: Secondary | ICD-10-CM | POA: Diagnosis not present

## 2021-04-19 DIAGNOSIS — E782 Mixed hyperlipidemia: Secondary | ICD-10-CM | POA: Diagnosis not present

## 2021-04-19 DIAGNOSIS — E1129 Type 2 diabetes mellitus with other diabetic kidney complication: Secondary | ICD-10-CM | POA: Diagnosis not present

## 2021-04-19 DIAGNOSIS — E1122 Type 2 diabetes mellitus with diabetic chronic kidney disease: Secondary | ICD-10-CM | POA: Diagnosis not present

## 2021-04-19 DIAGNOSIS — K2100 Gastro-esophageal reflux disease with esophagitis, without bleeding: Secondary | ICD-10-CM | POA: Diagnosis not present

## 2021-04-19 DIAGNOSIS — N183 Chronic kidney disease, stage 3 unspecified: Secondary | ICD-10-CM | POA: Diagnosis not present

## 2021-04-19 DIAGNOSIS — I10 Essential (primary) hypertension: Secondary | ICD-10-CM | POA: Diagnosis not present

## 2021-04-22 DIAGNOSIS — R4582 Worries: Secondary | ICD-10-CM | POA: Diagnosis not present

## 2021-04-22 DIAGNOSIS — I10 Essential (primary) hypertension: Secondary | ICD-10-CM | POA: Diagnosis not present

## 2021-04-22 DIAGNOSIS — E7849 Other hyperlipidemia: Secondary | ICD-10-CM | POA: Diagnosis not present

## 2021-04-22 DIAGNOSIS — Z7189 Other specified counseling: Secondary | ICD-10-CM | POA: Diagnosis not present

## 2021-04-22 DIAGNOSIS — R69 Illness, unspecified: Secondary | ICD-10-CM | POA: Diagnosis not present

## 2021-04-22 DIAGNOSIS — R2689 Other abnormalities of gait and mobility: Secondary | ICD-10-CM | POA: Diagnosis not present

## 2021-04-22 DIAGNOSIS — E1122 Type 2 diabetes mellitus with diabetic chronic kidney disease: Secondary | ICD-10-CM | POA: Diagnosis not present

## 2021-04-22 DIAGNOSIS — M069 Rheumatoid arthritis, unspecified: Secondary | ICD-10-CM | POA: Diagnosis not present

## 2021-04-22 DIAGNOSIS — I700 Atherosclerosis of aorta: Secondary | ICD-10-CM | POA: Diagnosis not present

## 2021-04-27 DIAGNOSIS — L723 Sebaceous cyst: Secondary | ICD-10-CM | POA: Diagnosis not present

## 2021-04-28 DIAGNOSIS — M150 Primary generalized (osteo)arthritis: Secondary | ICD-10-CM | POA: Diagnosis not present

## 2021-04-28 DIAGNOSIS — M2550 Pain in unspecified joint: Secondary | ICD-10-CM | POA: Diagnosis not present

## 2021-04-28 DIAGNOSIS — E663 Overweight: Secondary | ICD-10-CM | POA: Diagnosis not present

## 2021-04-28 DIAGNOSIS — Z6826 Body mass index (BMI) 26.0-26.9, adult: Secondary | ICD-10-CM | POA: Diagnosis not present

## 2021-04-28 DIAGNOSIS — M0609 Rheumatoid arthritis without rheumatoid factor, multiple sites: Secondary | ICD-10-CM | POA: Diagnosis not present

## 2021-05-19 DIAGNOSIS — Z23 Encounter for immunization: Secondary | ICD-10-CM | POA: Diagnosis not present

## 2021-05-19 DIAGNOSIS — Z6827 Body mass index (BMI) 27.0-27.9, adult: Secondary | ICD-10-CM | POA: Diagnosis not present

## 2021-05-19 DIAGNOSIS — R42 Dizziness and giddiness: Secondary | ICD-10-CM | POA: Diagnosis not present

## 2021-06-14 ENCOUNTER — Other Ambulatory Visit: Payer: Self-pay | Admitting: Family Medicine

## 2021-06-14 DIAGNOSIS — Z1231 Encounter for screening mammogram for malignant neoplasm of breast: Secondary | ICD-10-CM

## 2021-06-19 ENCOUNTER — Other Ambulatory Visit: Payer: Self-pay

## 2021-06-19 ENCOUNTER — Ambulatory Visit
Admission: RE | Admit: 2021-06-19 | Discharge: 2021-06-19 | Disposition: A | Discharge status: Home / Self Care | Payer: Medicare HMO | Source: Ambulatory Visit | Attending: Family Medicine | Admitting: Family Medicine

## 2021-06-19 DIAGNOSIS — Z1231 Encounter for screening mammogram for malignant neoplasm of breast: Secondary | ICD-10-CM | POA: Diagnosis not present

## 2021-07-22 DIAGNOSIS — Z6825 Body mass index (BMI) 25.0-25.9, adult: Secondary | ICD-10-CM | POA: Diagnosis not present

## 2021-07-22 DIAGNOSIS — M2550 Pain in unspecified joint: Secondary | ICD-10-CM | POA: Diagnosis not present

## 2021-07-22 DIAGNOSIS — M0609 Rheumatoid arthritis without rheumatoid factor, multiple sites: Secondary | ICD-10-CM | POA: Diagnosis not present

## 2021-07-22 DIAGNOSIS — E663 Overweight: Secondary | ICD-10-CM | POA: Diagnosis not present

## 2021-07-22 DIAGNOSIS — M791 Myalgia, unspecified site: Secondary | ICD-10-CM | POA: Diagnosis not present

## 2021-07-22 DIAGNOSIS — M150 Primary generalized (osteo)arthritis: Secondary | ICD-10-CM | POA: Diagnosis not present

## 2021-07-22 DIAGNOSIS — Z79899 Other long term (current) drug therapy: Secondary | ICD-10-CM | POA: Diagnosis not present

## 2021-07-26 ENCOUNTER — Other Ambulatory Visit: Payer: Self-pay

## 2021-07-26 ENCOUNTER — Encounter: Payer: Self-pay | Admitting: Dermatology

## 2021-07-26 ENCOUNTER — Ambulatory Visit: Payer: Medicare HMO | Admitting: Dermatology

## 2021-07-26 DIAGNOSIS — T7840XA Allergy, unspecified, initial encounter: Secondary | ICD-10-CM | POA: Diagnosis not present

## 2021-07-26 DIAGNOSIS — L820 Inflamed seborrheic keratosis: Secondary | ICD-10-CM | POA: Diagnosis not present

## 2021-07-26 DIAGNOSIS — L570 Actinic keratosis: Secondary | ICD-10-CM | POA: Diagnosis not present

## 2021-07-26 NOTE — Patient Instructions (Addendum)
OTC cerave anti itch cream. Its the red bottle. (Pramoxine is active ingredient)

## 2021-08-02 DIAGNOSIS — H6982 Other specified disorders of Eustachian tube, left ear: Secondary | ICD-10-CM | POA: Diagnosis not present

## 2021-08-02 DIAGNOSIS — J329 Chronic sinusitis, unspecified: Secondary | ICD-10-CM | POA: Diagnosis not present

## 2021-08-16 DIAGNOSIS — H35372 Puckering of macula, left eye: Secondary | ICD-10-CM | POA: Diagnosis not present

## 2021-08-16 DIAGNOSIS — E113293 Type 2 diabetes mellitus with mild nonproliferative diabetic retinopathy without macular edema, bilateral: Secondary | ICD-10-CM | POA: Diagnosis not present

## 2021-08-16 DIAGNOSIS — H3552 Pigmentary retinal dystrophy: Secondary | ICD-10-CM | POA: Diagnosis not present

## 2021-08-16 DIAGNOSIS — H43813 Vitreous degeneration, bilateral: Secondary | ICD-10-CM | POA: Diagnosis not present

## 2021-08-19 ENCOUNTER — Encounter: Payer: Self-pay | Admitting: Dermatology

## 2021-08-19 NOTE — Progress Notes (Signed)
° °  New Patient   Subjective  Carolyn [name] is a 81 y.o. female who presents for the following: New Patient (Initial Visit) (Patient here today to have several lesions checked. Per patient she has a lesions above her right upper lip x years no bleeding, dry and crusty. Check lesion on left shoulder x 1 year no bleeding, just scab. Check dry area on left ear x few months. Patient states that she's itching on her upper forehead x several months, patient states that she had a flare of some kind and the itching didn't go away. ).  General skin examination, several areas of concern Location:  Duration:  Quality:  Associated Signs/Symptoms: Modifying Factors:  Severity:  Timing: Context:    The following portions of the chart were reviewed this encounter and updated as appropriate:  Tobacco   Allergies   Meds   Problems   Med Hx   Surg Hx   Fam Hx       Objective  Well appearing patient in no apparent distress; mood and affect are within normal limits. Left Shoulder - Anterior, Left Temple, Mid Back, Right Lower Leg - Anterior Multiple flattopped brown 3 to 10 mm papules several of which are inflamed and some are lichenified  Left Ear, Right Upper Vermilion Lip 2 to 4 mm pink gritty crusts   Head - Anterior (Face) Had some redness and swelling. PCP gave patient a cortisone.  Now has persistent itching without obvious active dermatitis.  She can use some otc cereve anti itch if needed.     A full examination was performed including scalp, head, eyes, ears, nose, lips, neck, chest, axillae, abdomen, back, buttocks, bilateral upper extremities, bilateral lower extremities, hands, feet, fingers, toes, fingernails, and toenails. All findings within normal limits unless otherwise noted below.  Areas beneath undergarments not fully examined.   Assessment & Plan  Inflamed seborrheic keratosis (4) Left Shoulder - Anterior; Right Lower Leg - Anterior; Mid Back; Left Temple  Follow up after  christmas for treatment.   AK (actinic keratosis) (2) Left Ear; Right Upper Vermilion Lip  For intervention  Allergic reaction, initial encounter Head - Anterior (Face)  Cereve anti itch relief. Pramoxine active ingredient and can be found in multiple other over-the-counter products.  This can be used on as needed basis but for at least 1 to 2 weeks should be applied every day after bathing or presleep

## 2021-08-23 DIAGNOSIS — E782 Mixed hyperlipidemia: Secondary | ICD-10-CM | POA: Diagnosis not present

## 2021-08-23 DIAGNOSIS — E1165 Type 2 diabetes mellitus with hyperglycemia: Secondary | ICD-10-CM | POA: Diagnosis not present

## 2021-08-23 DIAGNOSIS — E1122 Type 2 diabetes mellitus with diabetic chronic kidney disease: Secondary | ICD-10-CM | POA: Diagnosis not present

## 2021-08-23 DIAGNOSIS — I10 Essential (primary) hypertension: Secondary | ICD-10-CM | POA: Diagnosis not present

## 2021-08-23 DIAGNOSIS — N1831 Chronic kidney disease, stage 3a: Secondary | ICD-10-CM | POA: Diagnosis not present

## 2021-08-23 DIAGNOSIS — K2100 Gastro-esophageal reflux disease with esophagitis, without bleeding: Secondary | ICD-10-CM | POA: Diagnosis not present

## 2021-08-23 DIAGNOSIS — Z1329 Encounter for screening for other suspected endocrine disorder: Secondary | ICD-10-CM | POA: Diagnosis not present

## 2021-08-25 DIAGNOSIS — Z23 Encounter for immunization: Secondary | ICD-10-CM | POA: Diagnosis not present

## 2021-08-25 DIAGNOSIS — R4582 Worries: Secondary | ICD-10-CM | POA: Diagnosis not present

## 2021-08-25 DIAGNOSIS — E7849 Other hyperlipidemia: Secondary | ICD-10-CM | POA: Diagnosis not present

## 2021-08-25 DIAGNOSIS — R69 Illness, unspecified: Secondary | ICD-10-CM | POA: Diagnosis not present

## 2021-08-25 DIAGNOSIS — M069 Rheumatoid arthritis, unspecified: Secondary | ICD-10-CM | POA: Diagnosis not present

## 2021-08-25 DIAGNOSIS — I10 Essential (primary) hypertension: Secondary | ICD-10-CM | POA: Diagnosis not present

## 2021-08-25 DIAGNOSIS — D519 Vitamin B12 deficiency anemia, unspecified: Secondary | ICD-10-CM | POA: Diagnosis not present

## 2021-08-25 DIAGNOSIS — E1122 Type 2 diabetes mellitus with diabetic chronic kidney disease: Secondary | ICD-10-CM | POA: Diagnosis not present

## 2021-08-25 DIAGNOSIS — I700 Atherosclerosis of aorta: Secondary | ICD-10-CM | POA: Diagnosis not present

## 2021-09-29 DIAGNOSIS — M7552 Bursitis of left shoulder: Secondary | ICD-10-CM | POA: Diagnosis not present

## 2021-09-29 DIAGNOSIS — Z6826 Body mass index (BMI) 26.0-26.9, adult: Secondary | ICD-10-CM | POA: Diagnosis not present

## 2021-09-29 DIAGNOSIS — L03115 Cellulitis of right lower limb: Secondary | ICD-10-CM | POA: Diagnosis not present

## 2021-09-29 DIAGNOSIS — D519 Vitamin B12 deficiency anemia, unspecified: Secondary | ICD-10-CM | POA: Diagnosis not present

## 2021-09-29 DIAGNOSIS — D510 Vitamin B12 deficiency anemia due to intrinsic factor deficiency: Secondary | ICD-10-CM | POA: Diagnosis not present

## 2021-10-05 DIAGNOSIS — C44721 Squamous cell carcinoma of skin of unspecified lower limb, including hip: Secondary | ICD-10-CM | POA: Diagnosis not present

## 2021-10-05 DIAGNOSIS — C44792 Other specified malignant neoplasm of skin of right lower limb, including hip: Secondary | ICD-10-CM | POA: Diagnosis not present

## 2021-10-05 DIAGNOSIS — L989 Disorder of the skin and subcutaneous tissue, unspecified: Secondary | ICD-10-CM | POA: Diagnosis not present

## 2021-10-05 DIAGNOSIS — C44722 Squamous cell carcinoma of skin of right lower limb, including hip: Secondary | ICD-10-CM | POA: Diagnosis not present

## 2021-10-13 DIAGNOSIS — C44721 Squamous cell carcinoma of skin of unspecified lower limb, including hip: Secondary | ICD-10-CM | POA: Diagnosis not present

## 2021-10-13 DIAGNOSIS — L03115 Cellulitis of right lower limb: Secondary | ICD-10-CM | POA: Diagnosis not present

## 2021-10-13 DIAGNOSIS — Z6827 Body mass index (BMI) 27.0-27.9, adult: Secondary | ICD-10-CM | POA: Diagnosis not present

## 2021-10-19 ENCOUNTER — Other Ambulatory Visit: Payer: Self-pay

## 2021-10-19 ENCOUNTER — Ambulatory Visit: Payer: Medicare HMO | Admitting: Dermatology

## 2021-10-19 DIAGNOSIS — D485 Neoplasm of uncertain behavior of skin: Secondary | ICD-10-CM

## 2021-10-19 DIAGNOSIS — L089 Local infection of the skin and subcutaneous tissue, unspecified: Secondary | ICD-10-CM

## 2021-10-21 DIAGNOSIS — M791 Myalgia, unspecified site: Secondary | ICD-10-CM | POA: Diagnosis not present

## 2021-10-21 DIAGNOSIS — M150 Primary generalized (osteo)arthritis: Secondary | ICD-10-CM | POA: Diagnosis not present

## 2021-10-21 DIAGNOSIS — M0609 Rheumatoid arthritis without rheumatoid factor, multiple sites: Secondary | ICD-10-CM | POA: Diagnosis not present

## 2021-10-21 DIAGNOSIS — Z6825 Body mass index (BMI) 25.0-25.9, adult: Secondary | ICD-10-CM | POA: Diagnosis not present

## 2021-10-21 DIAGNOSIS — M2550 Pain in unspecified joint: Secondary | ICD-10-CM | POA: Diagnosis not present

## 2021-10-21 DIAGNOSIS — E663 Overweight: Secondary | ICD-10-CM | POA: Diagnosis not present

## 2021-10-23 LAB — ANAEROBIC AND AEROBIC CULTURE
MICRO NUMBER:: 13134242
SPECIMEN QUALITY:: ADEQUATE

## 2021-10-28 ENCOUNTER — Telehealth: Payer: Self-pay | Admitting: Dermatology

## 2021-10-28 NOTE — Telephone Encounter (Signed)
Patient is calling for pathology results from last visit with Stuart Tafeen, MD 

## 2021-10-30 ENCOUNTER — Encounter: Payer: Self-pay | Admitting: Dermatology

## 2021-10-30 NOTE — Progress Notes (Signed)
? ?  Follow-Up Visit ?  ?Subjective  ?Carolyn [name] is a 81 y.o. female who presents for the following: Skin Problem (Dr. Olena Heckle did bx 10/05/2021 on R shin that came back SCC (pt is on doxy 100 BID)and pt would like area evaluated. Area burns and has some swelling. Pt was told to keep area moist with vaseline and covered. Pt also here for bx on the face and neck ). ? ?Possibly infected biopsy site right leg, growths on face and neck. ?Location:  ?Duration:  ?Quality:  ?Associated Signs/Symptoms: ?Modifying Factors:  ?Severity:  ?Timing: ?Context:  ? ?Objective  ?Well appearing patient in no apparent distress; mood and affect are within normal limits. ?Right Lower Leg - Anterior ?7 Millimeter shaggy based erosion with no granulation tissue.  Margins are nonelevated.  5 cm area of mild erythema and edema particularly inferior to the wound; no fluctuance, nontender.  Biopsy report stated to show skin cancer not available at time of visit. ? ? ? ? ? ? ?Head - Anterior (Face) ?With possible wound infection on the leg ? ? ?We will defer biopsy any lesions. ? ? ? ?A focused examination was performed including head, neck, legs.. Relevant physical exam findings are noted in the Assessment and Plan. ? ? ?Assessment & Plan  ? ? ?Local infection of the skin and subcutaneous tissue, unspecified ?Right Lower Leg - Anterior ? ?Although doxycycline should cover 90+ percent common wound infections, bacterial culture obtained.  Certainly any possible skin cancer surgery is best deferred, and if this is a superficial carcinoma of the biopsy may prove to be curative.  Encouraged to keep leg elevated when not ambulating.  Follow-up in 2 to 3 weeks. ? ?Related Procedures ?Anaerobic and Aerobic Culture ? ?Neoplasm of uncertain behavior of skin ?Head - Anterior (Face) ? ?Consider obtaining biopsies or follow-up. ? ? ? ? ? ?I, Lavonna Monarch, MD, have reviewed all documentation for this visit.  The documentation on 10/30/21 for the exam,  diagnosis, procedures, and orders are all accurate and complete. ?

## 2021-11-01 ENCOUNTER — Telehealth: Payer: Self-pay

## 2021-11-01 NOTE — Telephone Encounter (Signed)
-----   Message from Lavonna Monarch, MD sent at 10/26/2021  5:54 AM EDT ----- ?This organism clearly causes nonhospital acquired infections [place].  Once the sensitivity reports are back we will contact the patient for a clinical update. ?

## 2021-11-01 NOTE — Telephone Encounter (Signed)
Left message for patient to return our phone call for bacteria results.  ?

## 2021-11-01 NOTE — Telephone Encounter (Signed)
Patient is returning telephone call about results. ?

## 2021-11-02 DIAGNOSIS — M79604 Pain in right leg: Secondary | ICD-10-CM | POA: Diagnosis not present

## 2021-11-02 DIAGNOSIS — M1711 Unilateral primary osteoarthritis, right knee: Secondary | ICD-10-CM | POA: Diagnosis not present

## 2021-11-02 NOTE — Telephone Encounter (Signed)
Phone call to patient to check status and give lab report. Patient says leg is doing some better. Still a little tender. Patient has a follow up with [place] on the 12th but if leg gets worse she will call us back.  ?

## 2021-11-03 DIAGNOSIS — E538 Deficiency of other specified B group vitamins: Secondary | ICD-10-CM | POA: Diagnosis not present

## 2021-11-10 DIAGNOSIS — L03115 Cellulitis of right lower limb: Secondary | ICD-10-CM | POA: Diagnosis not present

## 2021-11-10 DIAGNOSIS — I10 Essential (primary) hypertension: Secondary | ICD-10-CM | POA: Diagnosis not present

## 2021-11-10 DIAGNOSIS — Z6826 Body mass index (BMI) 26.0-26.9, adult: Secondary | ICD-10-CM | POA: Diagnosis not present

## 2021-11-12 DIAGNOSIS — M25569 Pain in unspecified knee: Secondary | ICD-10-CM | POA: Diagnosis not present

## 2021-11-17 ENCOUNTER — Encounter: Payer: Self-pay | Admitting: Dermatology

## 2021-11-17 ENCOUNTER — Ambulatory Visit: Payer: Medicare HMO | Admitting: Dermatology

## 2021-11-17 DIAGNOSIS — L821 Other seborrheic keratosis: Secondary | ICD-10-CM

## 2021-11-17 DIAGNOSIS — D0471 Carcinoma in situ of skin of right lower limb, including hip: Secondary | ICD-10-CM | POA: Diagnosis not present

## 2021-11-17 DIAGNOSIS — Z1283 Encounter for screening for malignant neoplasm of skin: Secondary | ICD-10-CM

## 2021-11-17 DIAGNOSIS — D485 Neoplasm of uncertain behavior of skin: Secondary | ICD-10-CM

## 2021-11-17 NOTE — Patient Instructions (Signed)

## 2021-11-18 DIAGNOSIS — M1711 Unilateral primary osteoarthritis, right knee: Secondary | ICD-10-CM | POA: Diagnosis not present

## 2021-11-23 ENCOUNTER — Telehealth: Payer: Self-pay | Admitting: Dermatology

## 2021-11-23 NOTE — Telephone Encounter (Signed)
Patient calling for Bx results. I informed patient that we have just received these results and we are waiting for ST to review them and then the clinical staff will call her with results. ?

## 2021-11-24 ENCOUNTER — Telehealth: Payer: Self-pay | Admitting: *Deleted

## 2021-11-24 DIAGNOSIS — M0609 Rheumatoid arthritis without rheumatoid factor, multiple sites: Secondary | ICD-10-CM | POA: Diagnosis not present

## 2021-11-24 DIAGNOSIS — M7989 Other specified soft tissue disorders: Secondary | ICD-10-CM | POA: Diagnosis not present

## 2021-11-24 DIAGNOSIS — M1991 Primary osteoarthritis, unspecified site: Secondary | ICD-10-CM | POA: Diagnosis not present

## 2021-11-24 DIAGNOSIS — Z6824 Body mass index (BMI) 24.0-24.9, adult: Secondary | ICD-10-CM | POA: Diagnosis not present

## 2021-11-24 NOTE — Telephone Encounter (Signed)
-----   Message from Lavonna Monarch, MD sent at 11/24/2021  6:49 AM EDT ----- ?We will discuss treatment options at a routine follow-up visit.  She called yesterday and I addressed this in my response. ?

## 2021-11-24 NOTE — Telephone Encounter (Signed)
Path to patient  

## 2021-11-24 NOTE — Telephone Encounter (Signed)
Pathology to patient-surgery appointment scheduled.  

## 2021-12-03 DIAGNOSIS — E538 Deficiency of other specified B group vitamins: Secondary | ICD-10-CM | POA: Diagnosis not present

## 2021-12-05 ENCOUNTER — Encounter: Payer: Self-pay | Admitting: Dermatology

## 2021-12-05 NOTE — Progress Notes (Signed)
? ?  Follow-Up Visit ?  ?Subjective  ?Carolyn [name] is a 81 y.o. female who presents for the following: Skin Problem (Lesion on right lower leg x 1.5 months. Pt currently taking doxycycline and using mupirocin ointment for lesion on right leg. ). ? ?Crust on right leg persists, check other spots ?Location:  ?Duration:  ?Quality:  ?Associated Signs/Symptoms: ?Modifying Factors:  ?Severity:  ?Timing: ?Context:  ? ?Objective  ?Well appearing patient in no apparent distress; mood and affect are within normal limits. ?Waist up exam plus legs: No atypical pigmented lesions (all checked with dermoscopy).  No other sign of nonmelanoma skin cancer except for lesion on right shin. ? ?Left Buccal Cheek, Mid Back ?Flattopped textured 4 to 8 mm papules ? ?Right Lower Leg - Anterior ?Site of a skin cancer biopsied) was treated by her primary care physician.  There is exuberant granulation tissue but I cannot exclude residual carcinoma. ? ? ? ? ? ? ? ? ?A focused examination was performed including head, neck, upper chest, back, arms, legs.. Relevant physical exam findings are noted in the Assessment and Plan. ? ? ?Assessment & Plan  ? ? ?Encounter for screening for malignant neoplasm of skin ? ?Annual examination. ? ?Seborrheic keratosis (2) ?Mid Back; Left Buccal Cheek ? ?Leave if stable ? ?Neoplasm of uncertain behavior of skin ?Right Lower Leg - Anterior ? ?Skin / nail biopsy ?Type of biopsy: tangential   ?Informed consent: discussed and consent obtained   ?Timeout: patient name, date of birth, surgical site, and procedure verified   ?Anesthesia: the lesion was anesthetized in a standard fashion   ?Anesthetic:  1% lidocaine w/ epinephrine 1-100,000 local infiltration ?Instrument used: flexible razor blade   ?Hemostasis achieved with: ferric subsulfate and electrodesiccation   ?Outcome: patient tolerated procedure well   ?Post-procedure details: wound care instructions given   ? ?Specimen 1 - Surgical pathology ?Differential  Diagnosis: bcc vs scc- cautery only ? ?Check Margins: No ? ?Biopsy clearly indicated ? ? ? ? ? ?I, Lavonna Monarch, MD, have reviewed all documentation for this visit.  The documentation on 12/05/21 for the exam, diagnosis, procedures, and orders are all accurate and complete. ?

## 2021-12-09 ENCOUNTER — Encounter: Payer: Medicare HMO | Admitting: Dermatology

## 2021-12-16 ENCOUNTER — Encounter: Payer: Self-pay | Admitting: Dermatology

## 2021-12-16 ENCOUNTER — Ambulatory Visit (INDEPENDENT_AMBULATORY_CARE_PROVIDER_SITE_OTHER): Payer: Medicare HMO | Admitting: Dermatology

## 2021-12-16 DIAGNOSIS — D049 Carcinoma in situ of skin, unspecified: Secondary | ICD-10-CM

## 2021-12-16 MED ORDER — IMIQUIMOD 5 % EX CREA: TOPICAL_CREAM | Freq: Every day | CUTANEOUS | 0 refills | Status: DC

## 2021-12-23 ENCOUNTER — Encounter: Payer: Medicare HMO | Admitting: Dermatology

## 2021-12-27 DIAGNOSIS — I10 Essential (primary) hypertension: Secondary | ICD-10-CM | POA: Diagnosis not present

## 2021-12-27 DIAGNOSIS — E1165 Type 2 diabetes mellitus with hyperglycemia: Secondary | ICD-10-CM | POA: Diagnosis not present

## 2021-12-27 DIAGNOSIS — E7849 Other hyperlipidemia: Secondary | ICD-10-CM | POA: Diagnosis not present

## 2021-12-27 DIAGNOSIS — N1831 Chronic kidney disease, stage 3a: Secondary | ICD-10-CM | POA: Diagnosis not present

## 2021-12-27 DIAGNOSIS — K2100 Gastro-esophageal reflux disease with esophagitis, without bleeding: Secondary | ICD-10-CM | POA: Diagnosis not present

## 2021-12-27 DIAGNOSIS — E782 Mixed hyperlipidemia: Secondary | ICD-10-CM | POA: Diagnosis not present

## 2021-12-27 DIAGNOSIS — D519 Vitamin B12 deficiency anemia, unspecified: Secondary | ICD-10-CM | POA: Diagnosis not present

## 2021-12-29 DIAGNOSIS — H5203 Hypermetropia, bilateral: Secondary | ICD-10-CM | POA: Diagnosis not present

## 2021-12-29 DIAGNOSIS — E113292 Type 2 diabetes mellitus with mild nonproliferative diabetic retinopathy without macular edema, left eye: Secondary | ICD-10-CM | POA: Diagnosis not present

## 2021-12-29 DIAGNOSIS — H2513 Age-related nuclear cataract, bilateral: Secondary | ICD-10-CM | POA: Diagnosis not present

## 2021-12-29 DIAGNOSIS — H25013 Cortical age-related cataract, bilateral: Secondary | ICD-10-CM | POA: Diagnosis not present

## 2022-01-03 NOTE — Progress Notes (Signed)
   Follow-Up Visit   Subjective  Carolyn [name] is a 81 y.o. female who presents for the following: Procedure (Here for treatment right lower leg CIS.).  Carcinoma in situ right lower leg Location:  Duration:  Quality:  Associated Signs/Symptoms: Modifying Factors:  Severity:  Timing: Context:   Objective  Well appearing patient in no apparent distress; mood and affect are within normal limits. Right Lower Leg - Anterior Biopsy site identified by nurse, patient, and me.  Minimal residual flat crust so I prefer to try Aldara over surgery.  This was detailed to patient who expressed understanding.    A focused examination was performed including head, neck, arms, legs. Relevant physical exam findings are noted in the Assessment and Plan.   Assessment & Plan    Squamous cell carcinoma in situ (SCCIS) of skin Right Lower Leg - Anterior  imiquimod (ALDARA) 5 % cream Apply topically at bedtime. Monday Wednesday friday  She will apply Aldara 3-5 times weekly for a total of 20 applications or until there is brisk inflammation.  She knows to contact me if there are any issues.  I would like to look at the area 1 to 2 months after she stops the medication.      I, Lavonna Monarch, MD, have reviewed all documentation for this visit.  The documentation on 01/03/22 for the exam, diagnosis, procedures, and orders are all accurate and complete.

## 2022-01-05 DIAGNOSIS — I700 Atherosclerosis of aorta: Secondary | ICD-10-CM | POA: Diagnosis not present

## 2022-01-05 DIAGNOSIS — E7849 Other hyperlipidemia: Secondary | ICD-10-CM | POA: Diagnosis not present

## 2022-01-05 DIAGNOSIS — R69 Illness, unspecified: Secondary | ICD-10-CM | POA: Diagnosis not present

## 2022-01-05 DIAGNOSIS — E1122 Type 2 diabetes mellitus with diabetic chronic kidney disease: Secondary | ICD-10-CM | POA: Diagnosis not present

## 2022-01-05 DIAGNOSIS — M25512 Pain in left shoulder: Secondary | ICD-10-CM | POA: Diagnosis not present

## 2022-01-05 DIAGNOSIS — R2689 Other abnormalities of gait and mobility: Secondary | ICD-10-CM | POA: Diagnosis not present

## 2022-01-05 DIAGNOSIS — D519 Vitamin B12 deficiency anemia, unspecified: Secondary | ICD-10-CM | POA: Diagnosis not present

## 2022-01-05 DIAGNOSIS — Z6826 Body mass index (BMI) 26.0-26.9, adult: Secondary | ICD-10-CM | POA: Diagnosis not present

## 2022-01-05 DIAGNOSIS — I10 Essential (primary) hypertension: Secondary | ICD-10-CM | POA: Diagnosis not present

## 2022-01-05 DIAGNOSIS — Z0001 Encounter for general adult medical examination with abnormal findings: Secondary | ICD-10-CM | POA: Diagnosis not present

## 2022-01-05 DIAGNOSIS — M069 Rheumatoid arthritis, unspecified: Secondary | ICD-10-CM | POA: Diagnosis not present

## 2022-01-17 ENCOUNTER — Ambulatory Visit: Payer: Medicare HMO | Attending: Family Medicine

## 2022-01-17 DIAGNOSIS — M25512 Pain in left shoulder: Secondary | ICD-10-CM | POA: Diagnosis not present

## 2022-01-17 DIAGNOSIS — M25612 Stiffness of left shoulder, not elsewhere classified: Secondary | ICD-10-CM | POA: Diagnosis not present

## 2022-01-17 DIAGNOSIS — G8929 Other chronic pain: Secondary | ICD-10-CM | POA: Insufficient documentation

## 2022-01-17 NOTE — Therapy (Signed)
[facility] [place], [place], [address] Phone: [phone]   Fax:  [phone]  Physical Therapy Evaluation  Patient Details  Name: Carolyn [name] MRN: [id number] Date of Birth: 08/14/1940 Referring Provider (PT): Quillian Quince, MD   Encounter Date: 01/17/2022   PT End of Session - 01/17/22 1351     Visit Number 1    Number of Visits 6    Date for PT Re-Evaluation 04/01/22    PT Start Time 7035    PT Stop Time 1440    PT Time Calculation (min) 48 min    Activity Tolerance Patient tolerated treatment well    Behavior During Therapy [facility] for tasks assessed/performed             Past Medical History:  Diagnosis Date   Anxiety    Arthritis    RA   CKD (chronic kidney disease)    pt. denies   Complication of anesthesia    " my heart rate gets low"   Depression    Depression    Diabetes mellitus ([place])    type 2   GERD (gastroesophageal reflux disease)    Hepatic steatosis    Hyperlipidemia    Hyperparathyroidism, primary ([place])    Hypertension    IBS (irritable bowel syndrome)    Internal hemorrhoid    OA (osteoarthritis)    Syncope    Tubular adenoma of colon 1999   colonoscopy   Wears dentures    Wears glasses     Past Surgical History:  Procedure Laterality Date   ANKLE FRACTURE SURGERY Left yrs ago   BACK SURGERY  04/2011   lower fpr spinal stenosis and ruptured disc   COLONOSCOPY W/ BIOPSIES AND POLYPECTOMY     LAMINECTOMY  2009   Dr. Gladstone Lighter   LUMBAR DISC SURGERY  04/13/2011   L3, L4 Dr. Amil Amen   PARATHYROIDECTOMY Right 04/27/2017   Procedure: RIGHT PARATHYROIDECTOMY;  Surgeon: Armandina Gemma, MD;  Location: [facility];  Service: General;  Laterality: Right;   TONSILLECTOMY  as child   and adenoids   TOTAL HIP ARTHROPLASTY Right 01/22/2020   Procedure: TOTAL HIP ARTHROPLASTY ANTERIOR APPROACH;  Surgeon: Gaynelle Arabian, MD;  [facility];  Service: Orthopedics;  Laterality: Right;  175mn   TOTAL KNEE  ARTHROPLASTY Left 05/07/2018   Procedure: LEFT TOTAL KNEE ARTHROPLASTY;  Surgeon: AGaynelle Arabian MD;  [facility];  Service: Orthopedics;  Laterality: Left;  50 mins   VAGINAL HYSTERECTOMY  1964   partial   vein stripping R leg      There were no vitals filed for this visit.    Subjective Assessment - 01/17/22 1351     Subjective Patient reports that her shoulder has been getting worse over the last three months as she can no longer buckle her seatbelt with her left hand. She notes that it may have started due to overuse, but she is unsure. She has never had any pain or problems with her left shoulder before this episode.    Pertinent History OA, RA    Limitations Lifting;House hold activities;Writing    Patient Stated Goals reduce pain and improved mobility    Currently in Pain? Yes    Pain Score 8     Pain Location Shoulder    Pain Orientation Left    Pain Descriptors / Indicators Sharp    Pain Type Chronic pain    Pain Radiating Towards pain radiates from left shoulder into the elbow  Pain Onset More than a month ago    Pain Frequency Intermittent    Aggravating Factors  moving left arm, sleeping on left side    Pain Relieving Factors rest    Effect of Pain on Daily Activities the use of her left arm is limited due to her pain                [facility] PT Assessment - 01/17/22 0001       Assessment   Medical Diagnosis Left shoulder pain    Referring Provider (PT) Quillian Quince, MD    Onset Date/Surgical Date --   about 3 months ago   Hand Dominance Right   she writes with her left hand   Next MD Visit about 4 months    Prior Therapy No      Precautions   Precautions None      Restrictions   Weight Bearing Restrictions No      Balance Screen   Has the patient fallen in the past 6 months No    Has the patient had a decrease in activity level because of a fear of falling?  No    Is the patient reluctant to leave their home because of a fear of falling?  No       Prior Function   Level of Independence Independent    Leisure gardening      Cognition   Overall Cognitive Status Within Functional Limits for tasks assessed    Memory Appears intact    Awareness Appears intact    Problem Solving Appears intact      Sensation   Additional Comments Patient reports no numbness or tingling      ROM / Strength   AROM / PROM / Strength AROM;Strength      AROM   AROM Assessment Site Shoulder    Right/Left Shoulder Right;Left    Right Shoulder Flexion 147 Degrees    Right Shoulder ABduction 128 Degrees    Right Shoulder Internal Rotation --   to T6   Right Shoulder External Rotation --   to T5   Left Shoulder Flexion 156 Degrees   slight pain   Left Shoulder ABduction 106 Degrees   familiar pain   Left Shoulder Internal Rotation --   to T8; slight pain   Left Shoulder External Rotation --   to T6; slight pain     Strength   Strength Assessment Site Shoulder    Right/Left Shoulder Right;Left    Right Shoulder Flexion 4/5    Right Shoulder ABduction 4/5    Right Shoulder Internal Rotation 4/5    Right Shoulder External Rotation 4+/5    Left Shoulder Flexion 4/5    Left Shoulder ABduction 4/5   painful   Left Shoulder Internal Rotation 4/5    Left Shoulder External Rotation 4+/5      Palpation   Palpation comment TTP: left supraspinatus (reproduce familair pain); infraspinatus, upper trapezius, bicep (familiar pain), deltoid (familair pain), and triceps                        Objective measurements completed on examination: See above findings.       [facility] Adult PT Treatment/Exercise - 01/17/22 0001       Modalities   Modalities Moist Heat      Moist Heat Therapy   Number Minutes Moist Heat 10 Minutes    Moist Heat Location Shoulder      Manual  Therapy   Manual Therapy Soft tissue mobilization    Soft tissue mobilization left upper trapezius and deltoid                          PT Long Term Goals -  01/17/22 1507       PT LONG TERM GOAL #1   Title Patient will be independent with her HEP.    Time 6    Period Weeks    Status New    Target Date 02/28/22      PT LONG TERM GOAL #2   Title Patient will be able to complete her daily activities without her familiar shoulder pain exceeding 4/10.    Time 6    Period Weeks    Status New    Target Date 02/28/22      PT LONG TERM GOAL #3   Title Patient will be able to demonstrate at least 120 degrees of left shoulder abduction for improved function with overhead tasks.    Time 6    Period Weeks    Status New    Target Date 02/28/22      PT LONG TERM GOAL #4   Title Patient will report that she is able to garden without being limited by her familiar shoulder pain.    Time 6    Period Weeks    Status New    Target Date 02/28/22                    Plan - 01/17/22 1426     Clinical Impression Statement Patient is a 81 year old female presenting to physical therapy with chronic left shoulder pain following an overuse injury. She presented with moderate shoulder pain with low irritability with left shoulder abduction and palpation to the supraspinatus, deltoid, and bicep being the most aggravating to her familiar symptoms. Soft tissue mobilization to these areas were able to slightly reduce her familiar pain followed by moist heat. Recommend that she continue with skilled physical therapy to address her remaining impairments to return to her prior level of function.    Personal Factors and Comorbidities Finances;Comorbidity 3+    Comorbidities HTN, DM, OA    Examination-Activity Limitations Reach Overhead    Examination-Participation Restrictions Cleaning;Community Activity;Yard Work    Stability/Clinical Decision Making Stable/Uncomplicated    Clinical Decision Making Low    Rehab Potential Good    PT Frequency 1x / week    PT Duration 6 weeks    PT Treatment/Interventions ADLs/Self Care Home  Management;Cryotherapy;Electrical Stimulation;Moist Heat;Therapeutic exercise;Therapeutic activities;Functional mobility training;Neuromuscular re-education;Patient/family education;Manual techniques;Passive range of motion;Taping;Vasopneumatic Device    PT Next Visit Plan UBE, STM to left supraspinatus, deltoid, and bicep, and isometrics    Consulted and Agree with Plan of Care Patient             Patient will benefit from skilled therapeutic intervention in order to improve the following deficits and impairments:  Decreased range of motion, Impaired UE functional use, Decreased activity tolerance, Pain, Decreased strength  Visit Diagnosis: Chronic left shoulder pain  Stiffness of left shoulder, not elsewhere classified     Problem List Patient Active Problem List   Diagnosis Date Noted   OA (osteoarthritis) of hip 01/22/2020   Primary osteoarthritis of right hip 01/22/2020   Syncope and collapse 11/26/2019   Abdominal bloating 08/22/2019   Lower abdominal pain 08/22/2019   OA (osteoarthritis) of knee 05/07/2018   Hyperparathyroidism, primary (  [place]) 04/25/2017   Falls 07/19/2016   Precordial chest pain 12/23/2014   Hypercalcemia 12/23/2014   Diabetes ([place]) 10/23/2013   HTN (hypertension) 10/23/2013   Rationale for Evaluation and Treatment Rehabilitation   Darlin Coco, PT 01/17/2022, 3:12 PM  [facility] [place], [place], [address] Phone: [phone]   Fax:  [phone]  Name: Carolyn [name] MRN: [id number] Date of Birth: 12-09-1940

## 2022-01-20 DIAGNOSIS — M0609 Rheumatoid arthritis without rheumatoid factor, multiple sites: Secondary | ICD-10-CM | POA: Diagnosis not present

## 2022-01-20 DIAGNOSIS — Z79899 Other long term (current) drug therapy: Secondary | ICD-10-CM | POA: Diagnosis not present

## 2022-01-24 ENCOUNTER — Ambulatory Visit: Payer: Medicare HMO

## 2022-01-24 DIAGNOSIS — M25612 Stiffness of left shoulder, not elsewhere classified: Secondary | ICD-10-CM | POA: Diagnosis not present

## 2022-01-24 DIAGNOSIS — G8929 Other chronic pain: Secondary | ICD-10-CM | POA: Diagnosis not present

## 2022-01-24 DIAGNOSIS — M25512 Pain in left shoulder: Secondary | ICD-10-CM | POA: Diagnosis not present

## 2022-01-24 NOTE — Therapy (Signed)
[facility] [place], [place], [address] Phone: [phone]   Fax:  [phone]  Physical Therapy Treatment  Patient Details  Name: LAGUANA DESAUTEL MRN: [id number] Date of Birth: August 20, 1940 Referring Provider (PT): Quillian Quince, MD   Encounter Date: 01/24/2022   PT End of Session - 01/24/22 1351     Visit Number 2    Number of Visits 6    Date for PT Re-Evaluation 04/01/22    PT Start Time 1779    PT Stop Time 1430    PT Time Calculation (min) 45 min    Activity Tolerance Patient tolerated treatment well    Behavior During Therapy [facility] for tasks assessed/performed             Past Medical History:  Diagnosis Date   Anxiety    Arthritis    RA   CKD (chronic kidney disease)    pt. denies   Complication of anesthesia    " my heart rate gets low"   Depression    Depression    Diabetes mellitus ([place])    type 2   GERD (gastroesophageal reflux disease)    Hepatic steatosis    Hyperlipidemia    Hyperparathyroidism, primary ([place])    Hypertension    IBS (irritable bowel syndrome)    Internal hemorrhoid    OA (osteoarthritis)    Syncope    Tubular adenoma of colon 1999   colonoscopy   Wears dentures    Wears glasses     Past Surgical History:  Procedure Laterality Date   ANKLE FRACTURE SURGERY Left yrs ago   BACK SURGERY  04/2011   lower fpr spinal stenosis and ruptured disc   COLONOSCOPY W/ BIOPSIES AND POLYPECTOMY     LAMINECTOMY  2009   Dr. Gladstone Lighter   LUMBAR DISC SURGERY  04/13/2011   L3, L4 Dr. Amil Amen   PARATHYROIDECTOMY Right 04/27/2017   Procedure: RIGHT PARATHYROIDECTOMY;  Surgeon: Armandina Gemma, MD;  Location: [facility];  Service: General;  Laterality: Right;   TONSILLECTOMY  as child   and adenoids   TOTAL HIP ARTHROPLASTY Right 01/22/2020   Procedure: TOTAL HIP ARTHROPLASTY ANTERIOR APPROACH;  Surgeon: Gaynelle Arabian, MD;  [facility];  Service: Orthopedics;  Laterality: Right;  132mn   TOTAL KNEE  ARTHROPLASTY Left 05/07/2018   Procedure: LEFT TOTAL KNEE ARTHROPLASTY;  Surgeon: AGaynelle Arabian MD;  [facility];  Service: Orthopedics;  Laterality: Left;  50 mins   VAGINAL HYSTERECTOMY  1964   partial   vein stripping R leg      There were no vitals filed for this visit.   Subjective Assessment - 01/24/22 1346     Subjective Patient reports that her arm still really hurts when seh tries to reach across to her right pocket.    Pertinent History OA, RA    Limitations Lifting;House hold activities;Writing    Patient Stated Goals reduce pain and improved mobility    Currently in Pain? Yes    Pain Score 8     Pain Location Shoulder    Pain Orientation Left    Pain Onset More than a month ago                               [facility] - 01/24/22 0001       Exercises   Exercises Shoulder      Shoulder Exercises: Standing  External Rotation Left;20 reps;Theraband    Theraband Level (Shoulder External Rotation) Level 2 (Red)    Internal Rotation Left;Theraband;20 reps    Theraband Level (Shoulder Internal Rotation) Level 2 (Red)    Row Both;20 reps;10 reps   moderate cueing   Row Weight (lbs) Blue XTS      Shoulder Exercises: Pulleys   Flexion Other (comment)   4 minutes     Shoulder Exercises: ROM/Strengthening   UBE (Upper Arm Bike) 90 RPM x 8 minutes   forward and backward   Other ROM/Strengthening Exercises Ball roll out   2 minutes     Modalities   Modalities Electrical Stimulation;Moist Heat   no redness or adverse reaction to today's modalities     Moist Heat Therapy   Number Minutes Moist Heat 10 Minutes    Moist Heat Location Shoulder      Electrical Stimulation   Electrical Stimulation Location left bicep    Electrical Stimulation Action pre mod    Electrical Stimulation Parameters 80-150 Hz x 10 minutes    Electrical Stimulation Goals Pain                          PT Long Term Goals -  01/17/22 1507       PT LONG TERM GOAL #1   Title Patient will be independent with her HEP.    Time 6    Period Weeks    Status New    Target Date 02/28/22      PT LONG TERM GOAL #2   Title Patient will be able to complete her daily activities without her familiar shoulder pain exceeding 4/10.    Time 6    Period Weeks    Status New    Target Date 02/28/22      PT LONG TERM GOAL #3   Title Patient will be able to demonstrate at least 120 degrees of left shoulder abduction for improved function with overhead tasks.    Time 6    Period Weeks    Status New    Target Date 02/28/22      PT LONG TERM GOAL #4   Title Patient will report that she is able to garden without being limited by her familiar shoulder pain.    Time 6    Period Weeks    Status New    Target Date 02/28/22                   Plan - 01/24/22 1351     Clinical Impression Statement Patient presented with left shoulder pain primarily along her bicep. She was introduced to multiple new interventions for improved shoulder strength and mobility. She required moderate cueing with today's new interventions for proper biomechanics to facilitate proper muscular engagement. She experienced no significant increase in her familiar pain with any of today's interventions. She reported that her shoulder felt good upon the conclusion of treatment. She continues to require skilled physical therapy to address her remaining impairments to return to her prior level of function.    Personal Factors and Comorbidities Finances;Comorbidity 3+    Comorbidities HTN, DM, OA    Examination-Activity Limitations Reach Overhead    Examination-Participation Restrictions Cleaning;Community Activity;Yard Work    Stability/Clinical Decision Making Stable/Uncomplicated    Rehab Potential Good    PT Frequency 1x / week    PT Duration 6 weeks    PT Treatment/Interventions ADLs/Self Care Home Management;Cryotherapy;Electrical  Stimulation;Moist  Heat;Therapeutic exercise;Therapeutic activities;Functional mobility training;Neuromuscular re-education;Patient/family education;Manual techniques;Passive range of motion;Taping;Vasopneumatic Device    PT Next Visit Plan UBE, STM to left supraspinatus, deltoid, and bicep, and isometrics    Consulted and Agree with Plan of Care Patient             Patient will benefit from skilled therapeutic intervention in order to improve the following deficits and impairments:  Decreased range of motion, Impaired UE functional use, Decreased activity tolerance, Pain, Decreased strength  Visit Diagnosis: Chronic left shoulder pain  Stiffness of left shoulder, not elsewhere classified     Problem List Patient Active Problem List   Diagnosis Date Noted   OA (osteoarthritis) of hip 01/22/2020   Primary osteoarthritis of right hip 01/22/2020   Syncope and collapse 11/26/2019   Abdominal bloating 08/22/2019   Lower abdominal pain 08/22/2019   OA (osteoarthritis) of knee 05/07/2018   Hyperparathyroidism, primary ([place]) 04/25/2017   Falls 07/19/2016   Precordial chest pain 12/23/2014   Hypercalcemia 12/23/2014   Diabetes ([place]) 10/23/2013   HTN (hypertension) 10/23/2013   Rationale for Evaluation and Treatment Rehabilitation   Darlin Coco, PT 01/24/2022, 2:53 PM  [facility] [place], [place], [address] Phone: [phone]   Fax:  [phone]  Name: RAMYA VANBERGEN MRN: [id number] Date of Birth: 12-Jul-1941

## 2022-01-31 ENCOUNTER — Ambulatory Visit: Payer: Medicare HMO

## 2022-01-31 DIAGNOSIS — G8929 Other chronic pain: Secondary | ICD-10-CM

## 2022-01-31 DIAGNOSIS — M25612 Stiffness of left shoulder, not elsewhere classified: Secondary | ICD-10-CM

## 2022-01-31 DIAGNOSIS — M25512 Pain in left shoulder: Secondary | ICD-10-CM | POA: Diagnosis not present

## 2022-01-31 NOTE — Therapy (Addendum)
[facility] [place], [place], [address] Phone: [phone]   Fax:  [phone]  Physical Therapy Treatment  Patient Details  Name: Carolyn [name] MRN: [id number] Date of Birth: 09-04-1940 Referring Provider (PT): Quillian Quince, MD   Encounter Date: 01/31/2022   PT End of Session - 01/31/22 1416     Visit Number 3    Number of Visits 6    Date for PT Re-Evaluation 04/01/22    PT Start Time 3646    PT Stop Time 1440    PT Time Calculation (min) 55 min    Activity Tolerance Patient tolerated treatment well    Behavior During Therapy [facility] for tasks assessed/performed             Past Medical History:  Diagnosis Date   Anxiety    Arthritis    RA   CKD (chronic kidney disease)    pt. denies   Complication of anesthesia    " my heart rate gets low"   Depression    Depression    Diabetes mellitus ([place])    type 2   GERD (gastroesophageal reflux disease)    Hepatic steatosis    Hyperlipidemia    Hyperparathyroidism, primary ([place])    Hypertension    IBS (irritable bowel syndrome)    Internal hemorrhoid    OA (osteoarthritis)    Syncope    Tubular adenoma of colon 1999   colonoscopy   Wears dentures    Wears glasses     Past Surgical History:  Procedure Laterality Date   ANKLE FRACTURE SURGERY Left yrs ago   BACK SURGERY  04/2011   lower fpr spinal stenosis and ruptured disc   COLONOSCOPY W/ BIOPSIES AND POLYPECTOMY     LAMINECTOMY  2009   Dr. Gladstone Lighter   LUMBAR DISC SURGERY  04/13/2011   L3, L4 Dr. Amil Amen   PARATHYROIDECTOMY Right 04/27/2017   Procedure: RIGHT PARATHYROIDECTOMY;  Surgeon: Armandina Gemma, MD;  Location: [facility];  Service: General;  Laterality: Right;   TONSILLECTOMY  as child   and adenoids   TOTAL HIP ARTHROPLASTY Right 01/22/2020   Procedure: TOTAL HIP ARTHROPLASTY ANTERIOR APPROACH;  Surgeon: Gaynelle Arabian, MD;  [facility];  Service: Orthopedics;  Laterality: Right;  168min   TOTAL KNEE  ARTHROPLASTY Left 05/07/2018   Procedure: LEFT TOTAL KNEE ARTHROPLASTY;  Surgeon: Gaynelle Arabian, MD;  [facility];  Service: Orthopedics;  Laterality: Left;  50 mins   VAGINAL HYSTERECTOMY  1964   partial   vein stripping R leg      There were no vitals filed for this visit.   Subjective Assessment - 01/31/22 1350     Subjective Patient reports that her left shoulder is really sore today because she fell on that side on Saturday. She feels that she is fine otherwise. She was on a step stool while washing her screened in pouch when she slipped off the bottom step.    Pertinent History OA, RA    Limitations Lifting;House hold activities;Writing    Patient Stated Goals reduce pain and improved mobility    Currently in Pain? Yes    Pain Score 7     Pain Location Shoulder    Pain Orientation Left    Pain Onset More than a month ago                               [facility] Adult  PT Treatment/Exercise - 01/31/22 0001       Shoulder Exercises: Standing   Internal Rotation Left;Theraband;20 reps    Theraband Level (Shoulder Internal Rotation) Level 2 (Red)    Extension Both;Theraband   2 minutes   Theraband Level (Shoulder Extension) Level 3 (Green)    Row Both   2 minutes   Row Weight (lbs) Blue XTS    Diagonals Left;20 reps    Diagonals Weight (lbs) Blue XTS      Shoulder Exercises: Pulleys   Flexion 5 minutes      Shoulder Exercises: ROM/Strengthening   UBE (Upper Arm Bike) 120 RPM x 10 minutes      Modalities   Modalities Electrical Stimulation;Moist Heat   no redness or adverse reaction to today's modalities     Moist Heat Therapy   Number Minutes Moist Heat 10 Minutes    Moist Heat Location Shoulder      Electrical Stimulation   Electrical Stimulation Location left bicep    Electrical Stimulation Action pre mod    Electrical Stimulation Parameters 80-150 Hz x 11 minutes    Electrical Stimulation Goals Pain      Manual Therapy   Manual Therapy  Soft tissue mobilization    Soft tissue mobilization left bicep and deltoid                          PT Long Term Goals - 01/17/22 1507       PT LONG TERM GOAL #1   Title Patient will be independent with her HEP.    Time 6    Period Weeks    Status New    Target Date 02/28/22      PT LONG TERM GOAL #2   Title Patient will be able to complete her daily activities without her familiar shoulder pain exceeding 4/10.    Time 6    Period Weeks    Status New    Target Date 02/28/22      PT LONG TERM GOAL #3   Title Patient will be able to demonstrate at least 120 degrees of left shoulder abduction for improved function with overhead tasks.    Time 6    Period Weeks    Status New    Target Date 02/28/22      PT LONG TERM GOAL #4   Title Patient will report that she is able to garden without being limited by her familiar shoulder pain.    Time 6    Period Weeks    Status New    Target Date 02/28/22                   Plan - 01/31/22 1416     Clinical Impression Statement Patient was progressed with resisted shoulder extension with moderate difficulty. She required moderate cueing with resisted external rotation to prevent trunk rotation. Manual therapy focused on soft tissue mobilization to her left bicep and deltoid. She reported that her shoulder felt good as it was not hurting upon the conclusion of treatment. She continues to require skilled physical therapy to address her remaining impairments to return to her prior level of function.    Personal Factors and Comorbidities Finances;Comorbidity 3+    Comorbidities HTN, DM, OA    Examination-Activity Limitations Reach Overhead    Examination-Participation Restrictions Cleaning;Community Activity;Yard Work    Stability/Clinical Decision Making Stable/Uncomplicated    Rehab Potential Good    PT Frequency 1x /  week    PT Duration 6 weeks    PT Treatment/Interventions ADLs/Self Care Home  Management;Cryotherapy;Electrical Stimulation;Moist Heat;Therapeutic exercise;Therapeutic activities;Functional mobility training;Neuromuscular re-education;Patient/family education;Manual techniques;Passive range of motion;Taping;Vasopneumatic Device    PT Next Visit Plan UBE, STM to left supraspinatus, deltoid, and bicep, and isometrics    Consulted and Agree with Plan of Care Patient             Patient will benefit from skilled therapeutic intervention in order to improve the following deficits and impairments:  Decreased range of motion, Impaired UE functional use, Decreased activity tolerance, Pain, Decreased strength  Visit Diagnosis: Chronic left shoulder pain  Stiffness of left shoulder, not elsewhere classified     Problem List Patient Active Problem List   Diagnosis Date Noted   OA (osteoarthritis) of hip 01/22/2020   Primary osteoarthritis of right hip 01/22/2020   Syncope and collapse 11/26/2019   Abdominal bloating 08/22/2019   Lower abdominal pain 08/22/2019   OA (osteoarthritis) of knee 05/07/2018   Hyperparathyroidism, primary ([place]) 04/25/2017   Falls 07/19/2016   Precordial chest pain 12/23/2014   Hypercalcemia 12/23/2014   Diabetes ([place]) 10/23/2013   HTN (hypertension) 10/23/2013   Rationale for Evaluation and Treatment Rehabilitation   Darlin Coco, PT 01/31/2022, 2:44 PM  [facility] [place], [place], [address] Phone: [phone]   Fax:  [phone]  Name: Carolyn [name] MRN: [id number] Date of Birth: Nov 09, 1940  PHYSICAL THERAPY DISCHARGE SUMMARY  Visits from Start of Care: 3  Current functional level related to goals / functional outcomes: Patient was unable to meet her goals for skilled physical therapy and is being discharged at this time.  She failed to complete her recommended plan of care.   Remaining deficits: Pain and AROM   Education / Equipment: HEP  Patient agrees  to discharge. Patient goals were not met. Patient is being discharged due to not returning since the last visit.

## 2022-02-02 DIAGNOSIS — R079 Chest pain, unspecified: Secondary | ICD-10-CM | POA: Diagnosis not present

## 2022-02-02 DIAGNOSIS — K2100 Gastro-esophageal reflux disease with esophagitis, without bleeding: Secondary | ICD-10-CM | POA: Diagnosis not present

## 2022-02-07 DIAGNOSIS — E538 Deficiency of other specified B group vitamins: Secondary | ICD-10-CM | POA: Diagnosis not present

## 2022-02-14 DIAGNOSIS — H43813 Vitreous degeneration, bilateral: Secondary | ICD-10-CM | POA: Diagnosis not present

## 2022-02-14 DIAGNOSIS — H35372 Puckering of macula, left eye: Secondary | ICD-10-CM | POA: Diagnosis not present

## 2022-02-14 DIAGNOSIS — E113293 Type 2 diabetes mellitus with mild nonproliferative diabetic retinopathy without macular edema, bilateral: Secondary | ICD-10-CM | POA: Diagnosis not present

## 2022-02-14 DIAGNOSIS — H3552 Pigmentary retinal dystrophy: Secondary | ICD-10-CM | POA: Diagnosis not present

## 2022-02-22 DIAGNOSIS — M25572 Pain in left ankle and joints of left foot: Secondary | ICD-10-CM | POA: Diagnosis not present

## 2022-02-28 DIAGNOSIS — M2550 Pain in unspecified joint: Secondary | ICD-10-CM | POA: Diagnosis not present

## 2022-02-28 DIAGNOSIS — M7989 Other specified soft tissue disorders: Secondary | ICD-10-CM | POA: Diagnosis not present

## 2022-02-28 DIAGNOSIS — Z6824 Body mass index (BMI) 24.0-24.9, adult: Secondary | ICD-10-CM | POA: Diagnosis not present

## 2022-02-28 DIAGNOSIS — M1991 Primary osteoarthritis, unspecified site: Secondary | ICD-10-CM | POA: Diagnosis not present

## 2022-02-28 DIAGNOSIS — M0609 Rheumatoid arthritis without rheumatoid factor, multiple sites: Secondary | ICD-10-CM | POA: Diagnosis not present

## 2022-03-01 DIAGNOSIS — Z6827 Body mass index (BMI) 27.0-27.9, adult: Secondary | ICD-10-CM | POA: Diagnosis not present

## 2022-03-01 DIAGNOSIS — J329 Chronic sinusitis, unspecified: Secondary | ICD-10-CM | POA: Diagnosis not present

## 2022-03-01 DIAGNOSIS — G2581 Restless legs syndrome: Secondary | ICD-10-CM | POA: Diagnosis not present

## 2022-03-04 ENCOUNTER — Encounter: Payer: Self-pay | Admitting: Gastroenterology

## 2022-03-11 DIAGNOSIS — E538 Deficiency of other specified B group vitamins: Secondary | ICD-10-CM | POA: Diagnosis not present

## 2022-03-16 DIAGNOSIS — M25561 Pain in right knee: Secondary | ICD-10-CM | POA: Diagnosis not present

## 2022-03-21 ENCOUNTER — Ambulatory Visit: Payer: Medicare HMO | Admitting: Dermatology

## 2022-03-22 DIAGNOSIS — H524 Presbyopia: Secondary | ICD-10-CM | POA: Diagnosis not present

## 2022-03-22 DIAGNOSIS — H52223 Regular astigmatism, bilateral: Secondary | ICD-10-CM | POA: Diagnosis not present

## 2022-04-13 DIAGNOSIS — E538 Deficiency of other specified B group vitamins: Secondary | ICD-10-CM | POA: Diagnosis not present

## 2022-04-29 DIAGNOSIS — K2100 Gastro-esophageal reflux disease with esophagitis, without bleeding: Secondary | ICD-10-CM | POA: Diagnosis not present

## 2022-04-29 DIAGNOSIS — I10 Essential (primary) hypertension: Secondary | ICD-10-CM | POA: Diagnosis not present

## 2022-04-29 DIAGNOSIS — N183 Chronic kidney disease, stage 3 unspecified: Secondary | ICD-10-CM | POA: Diagnosis not present

## 2022-04-29 DIAGNOSIS — E782 Mixed hyperlipidemia: Secondary | ICD-10-CM | POA: Diagnosis not present

## 2022-04-29 DIAGNOSIS — E1165 Type 2 diabetes mellitus with hyperglycemia: Secondary | ICD-10-CM | POA: Diagnosis not present

## 2022-04-29 DIAGNOSIS — E1122 Type 2 diabetes mellitus with diabetic chronic kidney disease: Secondary | ICD-10-CM | POA: Diagnosis not present

## 2022-04-29 DIAGNOSIS — E7849 Other hyperlipidemia: Secondary | ICD-10-CM | POA: Diagnosis not present

## 2022-05-04 DIAGNOSIS — R4582 Worries: Secondary | ICD-10-CM | POA: Diagnosis not present

## 2022-05-04 DIAGNOSIS — N1831 Chronic kidney disease, stage 3a: Secondary | ICD-10-CM | POA: Diagnosis not present

## 2022-05-04 DIAGNOSIS — Z23 Encounter for immunization: Secondary | ICD-10-CM | POA: Diagnosis not present

## 2022-05-04 DIAGNOSIS — M25512 Pain in left shoulder: Secondary | ICD-10-CM | POA: Diagnosis not present

## 2022-05-04 DIAGNOSIS — M069 Rheumatoid arthritis, unspecified: Secondary | ICD-10-CM | POA: Diagnosis not present

## 2022-05-04 DIAGNOSIS — E7849 Other hyperlipidemia: Secondary | ICD-10-CM | POA: Diagnosis not present

## 2022-05-04 DIAGNOSIS — I10 Essential (primary) hypertension: Secondary | ICD-10-CM | POA: Diagnosis not present

## 2022-05-04 DIAGNOSIS — I700 Atherosclerosis of aorta: Secondary | ICD-10-CM | POA: Diagnosis not present

## 2022-05-04 DIAGNOSIS — Z6826 Body mass index (BMI) 26.0-26.9, adult: Secondary | ICD-10-CM | POA: Diagnosis not present

## 2022-05-04 DIAGNOSIS — R69 Illness, unspecified: Secondary | ICD-10-CM | POA: Diagnosis not present

## 2022-05-04 DIAGNOSIS — R2689 Other abnormalities of gait and mobility: Secondary | ICD-10-CM | POA: Diagnosis not present

## 2022-05-04 DIAGNOSIS — E1122 Type 2 diabetes mellitus with diabetic chronic kidney disease: Secondary | ICD-10-CM | POA: Diagnosis not present

## 2022-05-04 DIAGNOSIS — F331 Major depressive disorder, recurrent, moderate: Secondary | ICD-10-CM | POA: Diagnosis not present

## 2022-05-05 ENCOUNTER — Institutional Professional Consult (permissible substitution): Payer: Medicare HMO | Admitting: Neurology

## 2022-05-10 DIAGNOSIS — R42 Dizziness and giddiness: Secondary | ICD-10-CM | POA: Diagnosis not present

## 2022-05-10 DIAGNOSIS — Z6826 Body mass index (BMI) 26.0-26.9, adult: Secondary | ICD-10-CM | POA: Diagnosis not present

## 2022-05-10 DIAGNOSIS — R300 Dysuria: Secondary | ICD-10-CM | POA: Diagnosis not present

## 2022-05-10 DIAGNOSIS — I10 Essential (primary) hypertension: Secondary | ICD-10-CM | POA: Diagnosis not present

## 2022-05-27 ENCOUNTER — Other Ambulatory Visit: Payer: Self-pay | Admitting: Family Medicine

## 2022-05-27 DIAGNOSIS — Z1231 Encounter for screening mammogram for malignant neoplasm of breast: Secondary | ICD-10-CM

## 2022-06-01 DIAGNOSIS — M0609 Rheumatoid arthritis without rheumatoid factor, multiple sites: Secondary | ICD-10-CM | POA: Diagnosis not present

## 2022-06-01 DIAGNOSIS — M1991 Primary osteoarthritis, unspecified site: Secondary | ICD-10-CM | POA: Diagnosis not present

## 2022-06-01 DIAGNOSIS — Z6824 Body mass index (BMI) 24.0-24.9, adult: Secondary | ICD-10-CM | POA: Diagnosis not present

## 2022-06-01 DIAGNOSIS — M2550 Pain in unspecified joint: Secondary | ICD-10-CM | POA: Diagnosis not present

## 2022-06-17 DIAGNOSIS — R030 Elevated blood-pressure reading, without diagnosis of hypertension: Secondary | ICD-10-CM | POA: Diagnosis not present

## 2022-06-17 DIAGNOSIS — E538 Deficiency of other specified B group vitamins: Secondary | ICD-10-CM | POA: Diagnosis not present

## 2022-06-28 DIAGNOSIS — H2512 Age-related nuclear cataract, left eye: Secondary | ICD-10-CM | POA: Diagnosis not present

## 2022-06-28 DIAGNOSIS — H25012 Cortical age-related cataract, left eye: Secondary | ICD-10-CM | POA: Diagnosis not present

## 2022-06-28 DIAGNOSIS — H269 Unspecified cataract: Secondary | ICD-10-CM | POA: Diagnosis not present

## 2022-06-28 DIAGNOSIS — Z961 Presence of intraocular lens: Secondary | ICD-10-CM | POA: Diagnosis not present

## 2022-07-12 DIAGNOSIS — Z961 Presence of intraocular lens: Secondary | ICD-10-CM | POA: Diagnosis not present

## 2022-07-12 DIAGNOSIS — H25811 Combined forms of age-related cataract, right eye: Secondary | ICD-10-CM | POA: Diagnosis not present

## 2022-07-12 DIAGNOSIS — H2511 Age-related nuclear cataract, right eye: Secondary | ICD-10-CM | POA: Diagnosis not present

## 2022-07-12 DIAGNOSIS — H25011 Cortical age-related cataract, right eye: Secondary | ICD-10-CM | POA: Diagnosis not present

## 2022-07-12 DIAGNOSIS — H269 Unspecified cataract: Secondary | ICD-10-CM | POA: Diagnosis not present

## 2022-07-18 DIAGNOSIS — E663 Overweight: Secondary | ICD-10-CM | POA: Diagnosis not present

## 2022-07-18 DIAGNOSIS — M2550 Pain in unspecified joint: Secondary | ICD-10-CM | POA: Diagnosis not present

## 2022-07-18 DIAGNOSIS — M1991 Primary osteoarthritis, unspecified site: Secondary | ICD-10-CM | POA: Diagnosis not present

## 2022-07-18 DIAGNOSIS — M0609 Rheumatoid arthritis without rheumatoid factor, multiple sites: Secondary | ICD-10-CM | POA: Diagnosis not present

## 2022-07-18 DIAGNOSIS — Z6825 Body mass index (BMI) 25.0-25.9, adult: Secondary | ICD-10-CM | POA: Diagnosis not present

## 2022-07-19 ENCOUNTER — Ambulatory Visit: Payer: Medicare HMO

## 2022-07-20 DIAGNOSIS — Z6826 Body mass index (BMI) 26.0-26.9, adult: Secondary | ICD-10-CM | POA: Diagnosis not present

## 2022-07-20 DIAGNOSIS — I10 Essential (primary) hypertension: Secondary | ICD-10-CM | POA: Diagnosis not present

## 2022-07-27 ENCOUNTER — Ambulatory Visit: Payer: Medicare HMO | Admitting: Neurology

## 2022-07-27 ENCOUNTER — Encounter: Payer: Self-pay | Admitting: Neurology

## 2022-07-27 VITALS — BP 131/74 | HR 74 | Ht 66.0 in | Wt 161.6 lb

## 2022-07-27 DIAGNOSIS — M62551 Muscle wasting and atrophy, not elsewhere classified, right thigh: Secondary | ICD-10-CM | POA: Diagnosis not present

## 2022-07-27 DIAGNOSIS — R5383 Other fatigue: Secondary | ICD-10-CM | POA: Diagnosis not present

## 2022-07-27 NOTE — Progress Notes (Unsigned)
[facility]    Provider:  Dr Jaynee [name] Requesting Provider: Florinda [name] Primary Care Provider:  Caryl Bis, [name]  CC:  right calf fatty infiltrate (ongoing years)  07/27/2022: Here for a new issue, right calf denervation.  She has a past medical history of diabetes, primary hyperparathyroidism, hypercalcemia, hypertensive disorder, osteoarthritis, ankle pain, low back pain, muscle atrophy onset April 2023, syncope and collapse, fall, left total knee replacement, pain in the right hip, total replacement of right hip joint, multiple sites of joint pain.  I reviewed Carolyn. Jacquiline [name] notes, she goes to rheumatology and they did not feel like her methotrexate was initiated with her muscle displacement on the right calf, she does get knee injections there, Carolyn. Reynaldo [name] called a neurology consult to check right calf and look for knee displacement in the muscle and make sure that it is not a denervation type phenomenon and I will see her back in 3 to 4 months for another cortisone shot or sooner as needed.  She is also experienced pain in the right calf, she did not notice swelling in the calf until she underwent skin cancer removal in February 2023, the patient notes that Was not healing right and the doctor took another biopsy of the calf, the patient denied any back nerve damage that radiates down the right lower extremity, she has 6 mild tingling in the right lower extremity, and muscle spasms in the right lower extremity frequently, she states that she ambulates with an antalgic gait on the right lower extremity, an MRI was completed showing muscle atrophy related to a neurologic condition or to her rheumatoid medication such as methotrexate however as above I do not think it is her methotrexate so she is here today for evaluation.  Appears as though it started with swelling of her right calf and also had a biopsy there.   She is on methotrexate. The calf difference has been like this  for years, it was notes by Carolyn. Wynelle [name] while working on knees. Noticed larger than left calf but she had some atrophy in the left calf after knee replacement. She is having weakness, her legs hurt and stay weak all the time. She states her her entire legs just ache for about 4 years. Right calf is not painful. Hasn't spread to the rest of the leg. No rashes. She is on the the methotrexate and takes folic acid. No low back pain. She denies any trauma. She had a vericose vein operation in her 36s and a small skin biopsy but otherwise no trauma. Right leg weakness. No muscle twitching. Nothing in the face or upper extremities. No pain radiating from the back. They cut her right leg all the way up, multiple incisions on the lower right leg and stayed off the leg for weeks.    Reviewed notes, labs and imaging from outside physicians, which showed:  Reviewed MRI report, of the right calf, marked fatty infiltration of the proximal and distal medial gastrocnemius muscle, proximal medial soleus muscle, and mid to distal posterior peroneus longus muscle with thick bands of fat in the distal soleus muscle.  Query fat atrophy due to remote injury less likely denervation.  Patient complains of symptoms per HPI as well as the following symptoms: leg pain . Pertinent negatives and positives per HPI. All others negative   HPI 11/26/2019:  Carolyn [name] is a 81 y.o. female here as requested by Carolyn Halls, PA-C for tia vs seizure. PMHx OA, IBS, hypertension, primary  hyperparathyroidism, hyperlipidemia, hepatic steatosis, GERD, diabetes, depression, chronic kidney disease, anxiety. She was sitting on a bar stool, she felt like something was happening ike she was "fading out", she dropped her tea and fell back against the wall, she didn't shut her eyes, no shaking, no tongue biting or urination, her skin turned ash looking, about 5 - 6 minutes, when she came to everything was fine. No weakness, no facial droop, never had  a seizure in the past. Her daughter provides lots of information, they were in the kitchen on a bar stool at the end of the day on Easter, her head leaned back, her eyes were wide open, they were calling her name and no response, no shaking, biting or tongue or urinating, lasted 6-7 minutes and she was gray and ashy. They put her in a chair. She came to and not post-ictal. No facial droop. No focal weakness and was fine afterwards.   Reviewed notes, labs and imaging from outside physicians, which showed:  Labs collected November 04, 2019 include unremarkable CBC with hemoglobin 11.8 and hematocrit 35.7 and platelets 251.  BMP on November 10, 2019 showed a low sodium, creatinine 1.14, sodium 132, BUN 17, glucose 192.   I reviewed notes from emergency room, diagnosed with syncope and collapse, right hip pain, CT of the head showed no acute intracranial abnormality but did show atrophy and chronic small vessel microvascular disease (personally reviewed images).  I reviewed Carolyn. Olena [name] notes, patient had a recent visit to [facility], ED, for syncopal episode, in the setting of pain hip pain CT scans did confirm osteoarthritis of both hips worse on the right, the pain occasionally radiates down the entire leg, she is ambulating with a walker.  She was sitting at the table after dinner with her family, her daughter had noticed that she had spilled her cup of water and then saw her eyes rolled back in her head, she was unresponsive for a few minutes and then began mumbling, by the time EMS got there she was feeling back to baseline, she declined transport and came by family transportation, no reported seizure activity, no incontinence of urine, no recent illnesses other than troubled by the the pain in her hips causing her to have pain with ambulation, no headache blurry vision chest pain shortness of breath abdominal pain vomiting diarrhea urinary symptoms, no focal weakness or numbness, just prior to it happening she  felt very lightheaded. Review of Systems: Patient complains of symptoms per HPI as well as the following symptoms: hip pain. Pertinent negatives and positives per HPI. All others negative.   Social History   Socioeconomic History   Marital status: Widowed    Spouse name: Not on file   Number of children: 3   Years of education: 10   Highest education level: Not on file  Occupational History   Occupation: retired  Tobacco Use   Smoking status: Never   Smokeless tobacco: Never  Vaping Use   Vaping Use: Never used  Substance and Sexual Activity   Alcohol use: Never   Drug use: Never   Sexual activity: Not Currently  Other Topics Concern   Not on file  Social History Narrative   Lives alone   Drinks caffeine free diet soda daily   Left-handed   Social Determinants of Health   Financial Resource Strain: Not on file  Food Insecurity: Not on file  Transportation Needs: Not on file  Physical Activity: Not on file  Stress: Not on  file  Social Connections: Not on file  Intimate Partner Violence: Not on file    Family History  Problem Relation Age of [place]   Alzheimer's disease Mother    Suicidality Father    Stomach cancer Sister    Diabetes Sister    Colon polyps Sister        hyperplastic   High blood pressure Sister    Neuropathy Daughter    Colon cancer Neg Hx    Breast cancer Neg Hx     Past Medical History:  Diagnosis Date   Anxiety    Arthritis    RA   CKD (chronic kidney disease)    pt. denies   Complication of anesthesia    " my heart rate gets low"   Depression    Depression    Diabetes mellitus ([place])    type 2   GERD (gastroesophageal reflux disease)    Hepatic steatosis    Hyperlipidemia    Hyperparathyroidism, primary ([place])    Hypertension    IBS (irritable bowel syndrome)    Internal hemorrhoid    OA (osteoarthritis)    Syncope    Tubular adenoma of colon 1999   colonoscopy   Wears dentures    Wears glasses     Patient Active  Problem List   Diagnosis Date Noted   OA (osteoarthritis) of hip 01/22/2020   Primary osteoarthritis of right hip 01/22/2020   Syncope and collapse 11/26/2019   Abdominal bloating 08/22/2019   Lower abdominal pain 08/22/2019   OA (osteoarthritis) of knee 05/07/2018   Hyperparathyroidism, primary ([place]) 04/25/2017   Falls 07/19/2016   Precordial chest pain 12/23/2014   Hypercalcemia 12/23/2014   Diabetes ([place]) 10/23/2013   HTN (hypertension) 10/23/2013    Past Surgical History:  Procedure Laterality Date   ANKLE FRACTURE SURGERY Left yrs ago   BACK SURGERY  04/2011   lower fpr spinal stenosis and ruptured disc   COLONOSCOPY W/ BIOPSIES AND POLYPECTOMY     LAMINECTOMY  2009   Carolyn. Gladstone Lighter   LUMBAR DISC SURGERY  04/13/2011   L3, L4 Carolyn. Amil Amen   PARATHYROIDECTOMY Right 04/27/2017   Procedure: RIGHT PARATHYROIDECTOMY;  Surgeon: Armandina Gemma, [name];  Location: [facility];  Service: General;  Laterality: Right;   TONSILLECTOMY  as child   and adenoids   TOTAL HIP ARTHROPLASTY Right 01/22/2020   Procedure: TOTAL HIP ARTHROPLASTY ANTERIOR APPROACH;  Surgeon: Gaynelle Arabian, [name];  Location: [facility];  Service: Orthopedics;  Laterality: Right;  125mn   TOTAL KNEE ARTHROPLASTY Left 05/07/2018   Procedure: LEFT TOTAL KNEE ARTHROPLASTY;  Surgeon: AGaynelle Arabian [name];  Location: [facility];  Service: Orthopedics;  Laterality: Left;  50 mins   VAGINAL HYSTERECTOMY  1964   partial   vein stripping R leg      Current Outpatient Medications  Medication Sig Dispense Refill   Cholecalciferol (VITAMIN D-3) 125 MCG (5000 UT) TABS Take 5,000 Units by mouth daily.     citalopram (CELEXA) 20 MG tablet Take 20 mg by mouth daily with breakfast.      folic acid (FOLVITE) 1 MG tablet Take 1 mg by mouth daily.     glucose blood (ONETOUCH VERIO) test strip OneTouch Verio test strips     hydrocortisone 2.5 % cream Apply topically.     imiquimod (ALDARA) 5 % cream Apply topically at bedtime. Monday Wednesday friday 6 each  0   levocetirizine (XYZAL) 5 MG tablet Take 5 mg by mouth daily.  Liniments (SALONPAS PAIN RELIEF PATCH EX) Place 1 patch onto the skin as needed (pain).     losartan-hydrochlorothiazide (HYZAAR) 100-12.5 MG per tablet Take 1 tablet by mouth daily with breakfast.      metFORMIN (GLUCOPHAGE-XR) 500 MG 24 hr tablet Take 1,000 mg by mouth every evening.      methocarbamol (ROBAXIN) 500 MG tablet Take 1 tablet (500 mg total) by mouth every 6 (six) hours as needed for muscle spasms. (Patient taking differently: Take 500 mg by mouth as needed for muscle spasms.) 40 tablet 0   methotrexate 2.5 MG tablet Take by mouth.     mupirocin ointment (BACTROBAN) 2 % SMARTSIG:1 Application Topical 2-3 Times Daily     omeprazole (PRILOSEC) 20 MG capsule Take 20 mg by mouth daily before breakfast.      traMADol (ULTRAM) 50 MG tablet Take 1 tablet (50 mg total) by mouth every 8 (eight) hours as needed for severe pain. (Patient taking differently: Take 50 mg by mouth every 8 (eight) hours as needed for severe pain (for pain.).) 15 tablet 0   traZODone (DESYREL) 50 MG tablet Take 100 mg by mouth at bedtime.     Zinc 50 MG TABS Take 50 mg by mouth as needed.     No current facility-administered medications for this visit.   Facility-Administered Medications Ordered in Other Visits  Medication Dose Route Frequency Provider Last Rate Last Admin   sodium chloride flush (NS) 0.9 % injection 16 mL  16 mL Intracatheter PRN Melvenia Beam, [name]        Allergies as of 07/27/2022 - Review Complete 07/27/2022  Allergen Reaction Noted   Ace inhibitors  11/26/2019   Cephalexin  04/12/2021   Statins Other (See Comments) 12/04/2019   Zetia [ezetimibe]  11/26/2019   Sulfa antibiotics Rash 01/10/2012    Vitals: BP 131/74   Pulse 74   Ht '5\' 6"'$  (1.676 m)   Wt 161 lb 9.6 oz (73.3 kg)   BMI 26.08 kg/m  Last Weight:  Wt Readings from Last 1 Encounters:  07/27/22 161 lb 9.6 oz (73.3 kg)   Last Height:   Ht Readings  from Last 1 Encounters:  07/27/22 '5\' 6"'$  (1.676 m)    Physical exam: Exam: Gen: NAD, conversant, well nourised, well groomed                     CV: RRR, no MRG. No Carotid Bruits. No peripheral edema, warm, nontender Eyes: Conjunctivae clear without exudates or hemorrhage  Neuro: Detailed Neurologic Exam  Speech:    Speech is normal; fluent and spontaneous with normal comprehension.  Cognition:    The patient is oriented to person, place, and time;     recent and remote memory intact;     language fluent;     normal attention, concentration,     fund of knowledge Cranial Nerves:    The pupils are equal, round, and reactive to light. The fundi are normal and spontaneous venous pulsations are present. Visual fields are full to finger confrontation. Extraocular movements are intact. Trigeminal sensation is intact and the muscles of mastication are normal. The face is symmetric. The palate elevates in the midline. Hearing intact. Voice is normal. Shoulder shrug is normal. The tongue has normal motion without fasciculations.   Coordination:    Normal finger to nose and heel to shin. Normal rapid alternating movements.   Gait:    Heel-toe and tandem are normal. Antalgic without cane.  Motor Observation:   no atrophy, and no involuntary movements noted. Increased diameter of right calf(16 in) as compared to left (15 in) Tone:    Normal muscle tone.    Posture:    Posture is normal. normal erect    Strength:    Strength is V/V in the upper and lower limbs, completely normal strength right lower extre,ity     Sensation: intact to LT, pin prick, cold, proprioception and vibration     Reflex Exam:  DTR's:    Trace AJs, brisk patellars and biceps.  Toes:    The toes are downgoing bilaterally.   Clonus:    Clonus is absent.     Assessment/Plan:  81 y.o. female here as requested for fatty infiltrate of the right  PMHx OA, IBS, hypertension, primary hyperparathyroidism,  hyperlipidemia, hepatic steatosis, GERD, diabetes, depression, chronic kidney disease, anxiety.    Reviewed MRI report, of the right calf, marked fatty infiltration of the proximal and distal medial gastrocnemius muscle, proximal medial soleus muscle, and mid to distal posterior peroneus longus muscle with thick bands of fat in the distal soleus muscle.  Query fat atrophy due to remote injury less likely denervation.  Her exam today is no different than the one I did in 2021. Her Strength is completely intact in the lowers (including lower right leg), can even walk on her heals and toes, her right calf is one inch diameter bigger but I think it is from trauma, she thinks it has been like that for years, she had vein stripping in her 70s and they made multiple incisions in that leg and maybe that is why. But given stable exam over last 3 years and great strength we decided not to do an emg/ncs and just monitor. See her back in 6 months. Will check some labs.  Orders Placed This Encounter  Procedures   B12 and Folate Panel   Methylmalonic acid, serum   Vitamin B1   CK   Vitamin B6   CBC with Differential/Platelets   Comprehensive metabolic panel   TSH Rfx on Abnormal to Free T4   Vitamin D, 25-hydroxy      PRIOR Assessment and plan 2021:  (Addendum 12/26/2019: I spoke to patient today. I reviewed the MRI of her brain and there are old lacunar infarct however they were also there in her 2017 MRI, I also reviewed the images with Carolyn. Leonie Man who is a neuroradiologist and head of stroke and we do not think the cerebellar finding is a stroke but it appears to be a blood vessel. I don;t think there are any new findings on MRI brain. EEG normal, no epileptiform activity. CTA of the head and neck normal. She does have chronic microvascular ischemia and the mentioned lacunar infarct so I advice aspirin '81mg'$  daily. No embolic strokes seen on MRI.   As far a hip surgery, I will sign off from neurology  perspective. However I don't know why she had the syncopal episode and I think she should have the evaluation with Carolyn. Sherren Mocha for the PFO with shunting, she has an appointment with Carolyn. Burt Knack next week and a follow up with me in June.)   Unclear etiology, tia vs seizure vs cardiac event or other. She felt dizzy prior to the event and "faded out" and turned white, not postictal, no tongue biting or urination or abnormal muscle activity. May have been  been vasovagal or cardiac. Still we have to perform thorough stroke and seizure evaluation.But  recommend f/u with pcp for possible holter monitor or other cardiac workup/cards referral as clinically warranted by primary care. No driving for 6 months event free.Recommend pcp check lipids and hgba1c.   Addendum: PFO found on Echo at least oderately sized if we think this may have been a TIA may consider closure or Eliquis however patient has still not completed the workup including MRI brain, CTA of the head and neck, we will call her in the meantime referred to Carolyn. Burt Knack in cardiology.      Cc: Carolyn [name],    Sarina Ill, [name]  [facility] [address] [place], [place] [address]  Phone [phone] Fax [phone]  I spent 60 minutes of face-to-face and non-face-to-face time with patient on the  1. Atrophy of muscle of right thigh   2. Other fatigue    diagnosis.  This included previsit chart review, lab review, study review, order entry, electronic health record documentation, patient education on the different diagnostic and therapeutic options, counseling and coordination of care, risks and benefits of management, compliance, or risk factor reduction

## 2022-07-28 ENCOUNTER — Encounter: Payer: Self-pay | Admitting: Neurology

## 2022-08-02 LAB — CK: Total CK: 133 U/L (ref 26–161)

## 2022-08-02 LAB — CBC WITH DIFFERENTIAL/PLATELET
Basophils Absolute: 0 10*3/uL (ref 0.0–0.2)
Basos: 1 %
EOS (ABSOLUTE): 0.1 10*3/uL (ref 0.0–0.4)
Eos: 2 %
Hematocrit: 36.4 % (ref 34.0–46.6)
Hemoglobin: 11.9 g/dL (ref 11.1–15.9)
Immature Grans (Abs): 0 10*3/uL (ref 0.0–0.1)
Immature Granulocytes: 0 %
Lymphocytes Absolute: 0.7 10*3/uL (ref 0.7–3.1)
Lymphs: 14 %
MCH: 30.6 pg (ref 26.6–33.0)
MCHC: 32.7 g/dL (ref 31.5–35.7)
MCV: 94 fL (ref 79–97)
Monocytes Absolute: 0.4 10*3/uL (ref 0.1–0.9)
Monocytes: 8 %
Neutrophils Absolute: 3.4 10*3/uL (ref 1.4–7.0)
Neutrophils: 75 %
Platelets: 252 10*3/uL (ref 150–450)
RBC: 3.89 x10E6/uL (ref 3.77–5.28)
RDW: 15.1 % (ref 11.7–15.4)
WBC: 4.5 10*3/uL (ref 3.4–10.8)

## 2022-08-02 LAB — COMPREHENSIVE METABOLIC PANEL
ALT: 17 IU/L (ref 0–32)
AST: 22 IU/L (ref 0–40)
Albumin/Globulin Ratio: 2.2 (ref 1.2–2.2)
Albumin: 4.4 g/dL (ref 3.7–4.7)
Alkaline Phosphatase: 80 IU/L (ref 44–121)
BUN/Creatinine Ratio: 15 (ref 12–28)
BUN: 18 mg/dL (ref 8–27)
Bilirubin Total: 0.3 mg/dL (ref 0.0–1.2)
CO2: 27 mmol/L (ref 20–29)
Calcium: 9.8 mg/dL (ref 8.7–10.3)
Chloride: 97 mmol/L (ref 96–106)
Creatinine, Ser: 1.17 mg/dL — ABNORMAL HIGH (ref 0.57–1.00)
Globulin, Total: 2 g/dL (ref 1.5–4.5)
Glucose: 141 mg/dL — ABNORMAL HIGH (ref 70–99)
Potassium: 4.7 mmol/L (ref 3.5–5.2)
Sodium: 137 mmol/L (ref 134–144)
Total Protein: 6.4 g/dL (ref 6.0–8.5)
eGFR: 47 mL/min/{1.73_m2} — ABNORMAL LOW (ref >59)

## 2022-08-02 LAB — B12 AND FOLATE PANEL
Folate: >20 ng/mL (ref >3.0)
Vitamin B-12: 725 pg/mL (ref 232–1245)

## 2022-08-02 LAB — VITAMIN B6: Vitamin B6: 21.9 ug/L (ref 3.4–65.2)

## 2022-08-02 LAB — METHYLMALONIC ACID, SERUM: Methylmalonic Acid: 157 nmol/L (ref 0–378)

## 2022-08-02 LAB — VITAMIN D 25 HYDROXY (VIT D DEFICIENCY, FRACTURES): Vit D, 25-Hydroxy: 77.9 ng/mL (ref 30.0–100.0)

## 2022-08-02 LAB — VITAMIN B1: Thiamine: 104.1 nmol/L (ref 66.5–200.0)

## 2022-08-02 LAB — TSH RFX ON ABNORMAL TO FREE T4: TSH: 1.36 u[IU]/mL (ref 0.450–4.500)

## 2022-08-04 ENCOUNTER — Ambulatory Visit
Admission: RE | Admit: 2022-08-04 | Discharge: 2022-08-04 | Disposition: A | Discharge status: Home / Self Care | Payer: Medicare HMO | Source: Ambulatory Visit | Attending: Family Medicine | Admitting: Family Medicine

## 2022-08-04 DIAGNOSIS — Z1231 Encounter for screening mammogram for malignant neoplasm of breast: Secondary | ICD-10-CM

## 2022-08-18 DIAGNOSIS — H524 Presbyopia: Secondary | ICD-10-CM | POA: Diagnosis not present

## 2022-09-01 DIAGNOSIS — E1122 Type 2 diabetes mellitus with diabetic chronic kidney disease: Secondary | ICD-10-CM | POA: Diagnosis not present

## 2022-09-01 DIAGNOSIS — Z1329 Encounter for screening for other suspected endocrine disorder: Secondary | ICD-10-CM | POA: Diagnosis not present

## 2022-09-01 DIAGNOSIS — E1165 Type 2 diabetes mellitus with hyperglycemia: Secondary | ICD-10-CM | POA: Diagnosis not present

## 2022-09-01 DIAGNOSIS — E7849 Other hyperlipidemia: Secondary | ICD-10-CM | POA: Diagnosis not present

## 2022-09-01 DIAGNOSIS — K2100 Gastro-esophageal reflux disease with esophagitis, without bleeding: Secondary | ICD-10-CM | POA: Diagnosis not present

## 2022-09-01 DIAGNOSIS — N1831 Chronic kidney disease, stage 3a: Secondary | ICD-10-CM | POA: Diagnosis not present

## 2022-09-01 DIAGNOSIS — I10 Essential (primary) hypertension: Secondary | ICD-10-CM | POA: Diagnosis not present

## 2022-09-05 DIAGNOSIS — F331 Major depressive disorder, recurrent, moderate: Secondary | ICD-10-CM | POA: Diagnosis not present

## 2022-09-05 DIAGNOSIS — M1712 Unilateral primary osteoarthritis, left knee: Secondary | ICD-10-CM | POA: Diagnosis not present

## 2022-09-05 DIAGNOSIS — E782 Mixed hyperlipidemia: Secondary | ICD-10-CM | POA: Diagnosis not present

## 2022-09-05 DIAGNOSIS — I10 Essential (primary) hypertension: Secondary | ICD-10-CM | POA: Diagnosis not present

## 2022-09-05 DIAGNOSIS — R2689 Other abnormalities of gait and mobility: Secondary | ICD-10-CM | POA: Diagnosis not present

## 2022-09-05 DIAGNOSIS — M069 Rheumatoid arthritis, unspecified: Secondary | ICD-10-CM | POA: Diagnosis not present

## 2022-09-05 DIAGNOSIS — M1711 Unilateral primary osteoarthritis, right knee: Secondary | ICD-10-CM | POA: Diagnosis not present

## 2022-09-05 DIAGNOSIS — D519 Vitamin B12 deficiency anemia, unspecified: Secondary | ICD-10-CM | POA: Diagnosis not present

## 2022-09-05 DIAGNOSIS — Z1212 Encounter for screening for malignant neoplasm of rectum: Secondary | ICD-10-CM | POA: Diagnosis not present

## 2022-09-05 DIAGNOSIS — I700 Atherosclerosis of aorta: Secondary | ICD-10-CM | POA: Diagnosis not present

## 2022-09-05 DIAGNOSIS — Z23 Encounter for immunization: Secondary | ICD-10-CM | POA: Diagnosis not present

## 2022-09-05 DIAGNOSIS — E7849 Other hyperlipidemia: Secondary | ICD-10-CM | POA: Diagnosis not present

## 2022-09-05 DIAGNOSIS — E1122 Type 2 diabetes mellitus with diabetic chronic kidney disease: Secondary | ICD-10-CM | POA: Diagnosis not present

## 2022-09-05 DIAGNOSIS — M25512 Pain in left shoulder: Secondary | ICD-10-CM | POA: Diagnosis not present

## 2022-09-05 DIAGNOSIS — R69 Illness, unspecified: Secondary | ICD-10-CM | POA: Diagnosis not present

## 2022-09-05 DIAGNOSIS — N1831 Chronic kidney disease, stage 3a: Secondary | ICD-10-CM | POA: Diagnosis not present

## 2022-09-05 DIAGNOSIS — R4582 Worries: Secondary | ICD-10-CM | POA: Diagnosis not present

## 2022-09-05 DIAGNOSIS — K2100 Gastro-esophageal reflux disease with esophagitis, without bleeding: Secondary | ICD-10-CM | POA: Diagnosis not present

## 2022-09-14 DIAGNOSIS — M25561 Pain in right knee: Secondary | ICD-10-CM | POA: Diagnosis not present

## 2022-10-06 DIAGNOSIS — E538 Deficiency of other specified B group vitamins: Secondary | ICD-10-CM | POA: Diagnosis not present

## 2022-10-06 DIAGNOSIS — R030 Elevated blood-pressure reading, without diagnosis of hypertension: Secondary | ICD-10-CM | POA: Diagnosis not present

## 2022-10-11 DIAGNOSIS — N3000 Acute cystitis without hematuria: Secondary | ICD-10-CM | POA: Diagnosis not present

## 2022-10-11 DIAGNOSIS — R109 Unspecified abdominal pain: Secondary | ICD-10-CM | POA: Diagnosis not present

## 2022-10-25 DIAGNOSIS — H43813 Vitreous degeneration, bilateral: Secondary | ICD-10-CM | POA: Diagnosis not present

## 2022-10-25 DIAGNOSIS — H35033 Hypertensive retinopathy, bilateral: Secondary | ICD-10-CM | POA: Diagnosis not present

## 2022-10-25 DIAGNOSIS — H35372 Puckering of macula, left eye: Secondary | ICD-10-CM | POA: Diagnosis not present

## 2022-10-25 DIAGNOSIS — E113293 Type 2 diabetes mellitus with mild nonproliferative diabetic retinopathy without macular edema, bilateral: Secondary | ICD-10-CM | POA: Diagnosis not present

## 2022-10-25 DIAGNOSIS — H359 Unspecified retinal disorder: Secondary | ICD-10-CM | POA: Diagnosis not present

## 2022-10-31 DIAGNOSIS — L570 Actinic keratosis: Secondary | ICD-10-CM | POA: Diagnosis not present

## 2022-10-31 DIAGNOSIS — X32XXXA Exposure to sunlight, initial encounter: Secondary | ICD-10-CM | POA: Diagnosis not present

## 2022-11-03 DIAGNOSIS — E538 Deficiency of other specified B group vitamins: Secondary | ICD-10-CM | POA: Diagnosis not present

## 2022-11-03 DIAGNOSIS — R030 Elevated blood-pressure reading, without diagnosis of hypertension: Secondary | ICD-10-CM | POA: Diagnosis not present

## 2022-11-07 DIAGNOSIS — Z6827 Body mass index (BMI) 27.0-27.9, adult: Secondary | ICD-10-CM | POA: Diagnosis not present

## 2022-11-07 DIAGNOSIS — I10 Essential (primary) hypertension: Secondary | ICD-10-CM | POA: Diagnosis not present

## 2022-11-14 DIAGNOSIS — H2000 Unspecified acute and subacute iridocyclitis: Secondary | ICD-10-CM | POA: Diagnosis not present

## 2022-11-14 DIAGNOSIS — H5713 Ocular pain, bilateral: Secondary | ICD-10-CM | POA: Diagnosis not present

## 2022-11-17 DIAGNOSIS — M1991 Primary osteoarthritis, unspecified site: Secondary | ICD-10-CM | POA: Diagnosis not present

## 2022-11-17 DIAGNOSIS — M1711 Unilateral primary osteoarthritis, right knee: Secondary | ICD-10-CM | POA: Diagnosis not present

## 2022-11-17 DIAGNOSIS — M0609 Rheumatoid arthritis without rheumatoid factor, multiple sites: Secondary | ICD-10-CM | POA: Diagnosis not present

## 2022-11-17 DIAGNOSIS — E663 Overweight: Secondary | ICD-10-CM | POA: Diagnosis not present

## 2022-11-17 DIAGNOSIS — Z6825 Body mass index (BMI) 25.0-25.9, adult: Secondary | ICD-10-CM | POA: Diagnosis not present

## 2022-11-21 DIAGNOSIS — I10 Essential (primary) hypertension: Secondary | ICD-10-CM | POA: Diagnosis not present

## 2022-11-25 DIAGNOSIS — M1711 Unilateral primary osteoarthritis, right knee: Secondary | ICD-10-CM | POA: Diagnosis not present

## 2022-12-01 DIAGNOSIS — M1711 Unilateral primary osteoarthritis, right knee: Secondary | ICD-10-CM | POA: Diagnosis not present

## 2022-12-08 DIAGNOSIS — E538 Deficiency of other specified B group vitamins: Secondary | ICD-10-CM | POA: Diagnosis not present

## 2022-12-08 DIAGNOSIS — R030 Elevated blood-pressure reading, without diagnosis of hypertension: Secondary | ICD-10-CM | POA: Diagnosis not present

## 2022-12-08 DIAGNOSIS — H6121 Impacted cerumen, right ear: Secondary | ICD-10-CM | POA: Diagnosis not present

## 2022-12-08 DIAGNOSIS — H9201 Otalgia, right ear: Secondary | ICD-10-CM | POA: Diagnosis not present

## 2022-12-08 DIAGNOSIS — Z6828 Body mass index (BMI) 28.0-28.9, adult: Secondary | ICD-10-CM | POA: Diagnosis not present

## 2022-12-12 DIAGNOSIS — X32XXXD Exposure to sunlight, subsequent encounter: Secondary | ICD-10-CM | POA: Diagnosis not present

## 2022-12-12 DIAGNOSIS — L570 Actinic keratosis: Secondary | ICD-10-CM | POA: Diagnosis not present

## 2022-12-12 DIAGNOSIS — L821 Other seborrheic keratosis: Secondary | ICD-10-CM | POA: Diagnosis not present

## 2022-12-12 DIAGNOSIS — Z85828 Personal history of other malignant neoplasm of skin: Secondary | ICD-10-CM | POA: Diagnosis not present

## 2022-12-12 DIAGNOSIS — Z08 Encounter for follow-up examination after completed treatment for malignant neoplasm: Secondary | ICD-10-CM | POA: Diagnosis not present

## 2023-01-09 DIAGNOSIS — Z23 Encounter for immunization: Secondary | ICD-10-CM | POA: Diagnosis not present

## 2023-01-09 DIAGNOSIS — E1122 Type 2 diabetes mellitus with diabetic chronic kidney disease: Secondary | ICD-10-CM | POA: Diagnosis not present

## 2023-01-09 DIAGNOSIS — M79672 Pain in left foot: Secondary | ICD-10-CM | POA: Diagnosis not present

## 2023-01-09 DIAGNOSIS — M069 Rheumatoid arthritis, unspecified: Secondary | ICD-10-CM | POA: Diagnosis not present

## 2023-01-09 DIAGNOSIS — E7849 Other hyperlipidemia: Secondary | ICD-10-CM | POA: Diagnosis not present

## 2023-01-09 DIAGNOSIS — I10 Essential (primary) hypertension: Secondary | ICD-10-CM | POA: Diagnosis not present

## 2023-01-09 DIAGNOSIS — Z0001 Encounter for general adult medical examination with abnormal findings: Secondary | ICD-10-CM | POA: Diagnosis not present

## 2023-01-09 DIAGNOSIS — K2100 Gastro-esophageal reflux disease with esophagitis, without bleeding: Secondary | ICD-10-CM | POA: Diagnosis not present

## 2023-01-09 DIAGNOSIS — D519 Vitamin B12 deficiency anemia, unspecified: Secondary | ICD-10-CM | POA: Diagnosis not present

## 2023-01-09 DIAGNOSIS — R2689 Other abnormalities of gait and mobility: Secondary | ICD-10-CM | POA: Diagnosis not present

## 2023-01-09 DIAGNOSIS — I700 Atherosclerosis of aorta: Secondary | ICD-10-CM | POA: Diagnosis not present

## 2023-01-09 DIAGNOSIS — R4582 Worries: Secondary | ICD-10-CM | POA: Diagnosis not present

## 2023-01-13 DIAGNOSIS — Z6827 Body mass index (BMI) 27.0-27.9, adult: Secondary | ICD-10-CM | POA: Diagnosis not present

## 2023-01-13 DIAGNOSIS — M722 Plantar fascial fibromatosis: Secondary | ICD-10-CM | POA: Diagnosis not present

## 2023-01-13 DIAGNOSIS — R030 Elevated blood-pressure reading, without diagnosis of hypertension: Secondary | ICD-10-CM | POA: Diagnosis not present

## 2023-01-17 DIAGNOSIS — M79672 Pain in left foot: Secondary | ICD-10-CM | POA: Diagnosis not present

## 2023-01-25 DIAGNOSIS — M79672 Pain in left foot: Secondary | ICD-10-CM | POA: Diagnosis not present

## 2023-02-01 DIAGNOSIS — M79672 Pain in left foot: Secondary | ICD-10-CM | POA: Diagnosis not present

## 2023-02-03 DIAGNOSIS — E871 Hypo-osmolality and hyponatremia: Secondary | ICD-10-CM | POA: Diagnosis not present

## 2023-02-07 DIAGNOSIS — E538 Deficiency of other specified B group vitamins: Secondary | ICD-10-CM | POA: Diagnosis not present

## 2023-02-07 DIAGNOSIS — R030 Elevated blood-pressure reading, without diagnosis of hypertension: Secondary | ICD-10-CM | POA: Diagnosis not present

## 2023-02-14 DIAGNOSIS — M79672 Pain in left foot: Secondary | ICD-10-CM | POA: Diagnosis not present

## 2023-02-14 DIAGNOSIS — R2689 Other abnormalities of gait and mobility: Secondary | ICD-10-CM | POA: Diagnosis not present

## 2023-02-14 DIAGNOSIS — M25579 Pain in unspecified ankle and joints of unspecified foot: Secondary | ICD-10-CM | POA: Diagnosis not present

## 2023-02-15 ENCOUNTER — Ambulatory Visit: Payer: Medicare HMO | Admitting: Neurology

## 2023-02-20 DIAGNOSIS — E663 Overweight: Secondary | ICD-10-CM | POA: Diagnosis not present

## 2023-02-20 DIAGNOSIS — Z6825 Body mass index (BMI) 25.0-25.9, adult: Secondary | ICD-10-CM | POA: Diagnosis not present

## 2023-02-20 DIAGNOSIS — M1991 Primary osteoarthritis, unspecified site: Secondary | ICD-10-CM | POA: Diagnosis not present

## 2023-02-20 DIAGNOSIS — M0609 Rheumatoid arthritis without rheumatoid factor, multiple sites: Secondary | ICD-10-CM | POA: Diagnosis not present

## 2023-02-22 DIAGNOSIS — R2689 Other abnormalities of gait and mobility: Secondary | ICD-10-CM | POA: Diagnosis not present

## 2023-02-22 DIAGNOSIS — M79672 Pain in left foot: Secondary | ICD-10-CM | POA: Diagnosis not present

## 2023-02-22 DIAGNOSIS — M25579 Pain in unspecified ankle and joints of unspecified foot: Secondary | ICD-10-CM | POA: Diagnosis not present

## 2023-02-24 DIAGNOSIS — M79672 Pain in left foot: Secondary | ICD-10-CM | POA: Diagnosis not present

## 2023-02-24 DIAGNOSIS — R2689 Other abnormalities of gait and mobility: Secondary | ICD-10-CM | POA: Diagnosis not present

## 2023-02-24 DIAGNOSIS — M25579 Pain in unspecified ankle and joints of unspecified foot: Secondary | ICD-10-CM | POA: Diagnosis not present

## 2023-03-01 DIAGNOSIS — R2689 Other abnormalities of gait and mobility: Secondary | ICD-10-CM | POA: Diagnosis not present

## 2023-03-01 DIAGNOSIS — M25572 Pain in left ankle and joints of left foot: Secondary | ICD-10-CM | POA: Diagnosis not present

## 2023-03-01 DIAGNOSIS — M79672 Pain in left foot: Secondary | ICD-10-CM | POA: Diagnosis not present

## 2023-03-09 DIAGNOSIS — M25571 Pain in right ankle and joints of right foot: Secondary | ICD-10-CM | POA: Diagnosis not present

## 2023-03-09 DIAGNOSIS — R2689 Other abnormalities of gait and mobility: Secondary | ICD-10-CM | POA: Diagnosis not present

## 2023-03-09 DIAGNOSIS — M79672 Pain in left foot: Secondary | ICD-10-CM | POA: Diagnosis not present

## 2023-03-10 DIAGNOSIS — R030 Elevated blood-pressure reading, without diagnosis of hypertension: Secondary | ICD-10-CM | POA: Diagnosis not present

## 2023-03-10 DIAGNOSIS — R252 Cramp and spasm: Secondary | ICD-10-CM | POA: Diagnosis not present

## 2023-03-10 DIAGNOSIS — D519 Vitamin B12 deficiency anemia, unspecified: Secondary | ICD-10-CM | POA: Diagnosis not present

## 2023-03-10 DIAGNOSIS — Z6827 Body mass index (BMI) 27.0-27.9, adult: Secondary | ICD-10-CM | POA: Diagnosis not present

## 2023-03-23 DIAGNOSIS — R2689 Other abnormalities of gait and mobility: Secondary | ICD-10-CM | POA: Diagnosis not present

## 2023-03-23 DIAGNOSIS — M25572 Pain in left ankle and joints of left foot: Secondary | ICD-10-CM | POA: Diagnosis not present

## 2023-03-23 DIAGNOSIS — M1711 Unilateral primary osteoarthritis, right knee: Secondary | ICD-10-CM | POA: Diagnosis not present

## 2023-03-23 DIAGNOSIS — M79672 Pain in left foot: Secondary | ICD-10-CM | POA: Diagnosis not present

## 2023-03-30 DIAGNOSIS — M25572 Pain in left ankle and joints of left foot: Secondary | ICD-10-CM | POA: Diagnosis not present

## 2023-03-30 DIAGNOSIS — M79672 Pain in left foot: Secondary | ICD-10-CM | POA: Diagnosis not present

## 2023-04-15 DIAGNOSIS — M79672 Pain in left foot: Secondary | ICD-10-CM | POA: Diagnosis not present

## 2023-04-19 DIAGNOSIS — L089 Local infection of the skin and subcutaneous tissue, unspecified: Secondary | ICD-10-CM | POA: Diagnosis not present

## 2023-04-21 DIAGNOSIS — M722 Plantar fascial fibromatosis: Secondary | ICD-10-CM | POA: Diagnosis not present

## 2023-04-21 DIAGNOSIS — M79672 Pain in left foot: Secondary | ICD-10-CM | POA: Diagnosis not present

## 2023-05-01 DIAGNOSIS — M79672 Pain in left foot: Secondary | ICD-10-CM | POA: Diagnosis not present

## 2023-05-02 DIAGNOSIS — H43813 Vitreous degeneration, bilateral: Secondary | ICD-10-CM | POA: Diagnosis not present

## 2023-05-02 DIAGNOSIS — H35372 Puckering of macula, left eye: Secondary | ICD-10-CM | POA: Diagnosis not present

## 2023-05-02 DIAGNOSIS — E119 Type 2 diabetes mellitus without complications: Secondary | ICD-10-CM | POA: Diagnosis not present

## 2023-05-02 DIAGNOSIS — E113293 Type 2 diabetes mellitus with mild nonproliferative diabetic retinopathy without macular edema, bilateral: Secondary | ICD-10-CM | POA: Diagnosis not present

## 2023-05-02 DIAGNOSIS — H35033 Hypertensive retinopathy, bilateral: Secondary | ICD-10-CM | POA: Diagnosis not present

## 2023-05-11 DIAGNOSIS — K219 Gastro-esophageal reflux disease without esophagitis: Secondary | ICD-10-CM | POA: Diagnosis not present

## 2023-05-11 DIAGNOSIS — E1129 Type 2 diabetes mellitus with other diabetic kidney complication: Secondary | ICD-10-CM | POA: Diagnosis not present

## 2023-05-11 DIAGNOSIS — E782 Mixed hyperlipidemia: Secondary | ICD-10-CM | POA: Diagnosis not present

## 2023-05-11 DIAGNOSIS — N1831 Chronic kidney disease, stage 3a: Secondary | ICD-10-CM | POA: Diagnosis not present

## 2023-05-11 DIAGNOSIS — Z1321 Encounter for screening for nutritional disorder: Secondary | ICD-10-CM | POA: Diagnosis not present

## 2023-05-11 DIAGNOSIS — E1122 Type 2 diabetes mellitus with diabetic chronic kidney disease: Secondary | ICD-10-CM | POA: Diagnosis not present

## 2023-05-12 DIAGNOSIS — S9032XA Contusion of left foot, initial encounter: Secondary | ICD-10-CM | POA: Diagnosis not present

## 2023-05-15 ENCOUNTER — Other Ambulatory Visit: Payer: Self-pay | Admitting: Orthopaedic Surgery

## 2023-05-15 ENCOUNTER — Ambulatory Visit
Admission: RE | Admit: 2023-05-15 | Discharge: 2023-05-15 | Disposition: A | Discharge status: Home / Self Care | Payer: Medicare HMO | Source: Ambulatory Visit | Attending: Orthopaedic Surgery | Admitting: Orthopaedic Surgery

## 2023-05-15 ENCOUNTER — Encounter: Payer: Self-pay | Admitting: Orthopaedic Surgery

## 2023-05-15 DIAGNOSIS — M79672 Pain in left foot: Secondary | ICD-10-CM | POA: Diagnosis not present

## 2023-05-18 DIAGNOSIS — E538 Deficiency of other specified B group vitamins: Secondary | ICD-10-CM | POA: Diagnosis not present

## 2023-05-18 DIAGNOSIS — R2689 Other abnormalities of gait and mobility: Secondary | ICD-10-CM | POA: Diagnosis not present

## 2023-05-18 DIAGNOSIS — E1122 Type 2 diabetes mellitus with diabetic chronic kidney disease: Secondary | ICD-10-CM | POA: Diagnosis not present

## 2023-05-18 DIAGNOSIS — K2100 Gastro-esophageal reflux disease with esophagitis, without bleeding: Secondary | ICD-10-CM | POA: Diagnosis not present

## 2023-05-18 DIAGNOSIS — R4582 Worries: Secondary | ICD-10-CM | POA: Diagnosis not present

## 2023-05-18 DIAGNOSIS — E7849 Other hyperlipidemia: Secondary | ICD-10-CM | POA: Diagnosis not present

## 2023-05-18 DIAGNOSIS — Z23 Encounter for immunization: Secondary | ICD-10-CM | POA: Diagnosis not present

## 2023-05-18 DIAGNOSIS — I10 Essential (primary) hypertension: Secondary | ICD-10-CM | POA: Diagnosis not present

## 2023-05-18 DIAGNOSIS — I700 Atherosclerosis of aorta: Secondary | ICD-10-CM | POA: Diagnosis not present

## 2023-05-18 DIAGNOSIS — M069 Rheumatoid arthritis, unspecified: Secondary | ICD-10-CM | POA: Diagnosis not present

## 2023-05-18 DIAGNOSIS — M79672 Pain in left foot: Secondary | ICD-10-CM | POA: Diagnosis not present

## 2023-05-18 DIAGNOSIS — M25512 Pain in left shoulder: Secondary | ICD-10-CM | POA: Diagnosis not present

## 2023-05-23 DIAGNOSIS — M0609 Rheumatoid arthritis without rheumatoid factor, multiple sites: Secondary | ICD-10-CM | POA: Diagnosis not present

## 2023-05-23 DIAGNOSIS — Z6826 Body mass index (BMI) 26.0-26.9, adult: Secondary | ICD-10-CM | POA: Diagnosis not present

## 2023-05-23 DIAGNOSIS — E663 Overweight: Secondary | ICD-10-CM | POA: Diagnosis not present

## 2023-05-23 DIAGNOSIS — M1991 Primary osteoarthritis, unspecified site: Secondary | ICD-10-CM | POA: Diagnosis not present

## 2023-05-25 DIAGNOSIS — S9032XA Contusion of left foot, initial encounter: Secondary | ICD-10-CM | POA: Diagnosis not present

## 2023-05-25 DIAGNOSIS — M79672 Pain in left foot: Secondary | ICD-10-CM | POA: Diagnosis not present

## 2023-06-02 DIAGNOSIS — S9032XA Contusion of left foot, initial encounter: Secondary | ICD-10-CM | POA: Diagnosis not present

## 2023-06-12 DIAGNOSIS — X32XXXD Exposure to sunlight, subsequent encounter: Secondary | ICD-10-CM | POA: Diagnosis not present

## 2023-06-12 DIAGNOSIS — L570 Actinic keratosis: Secondary | ICD-10-CM | POA: Diagnosis not present

## 2023-06-12 DIAGNOSIS — L820 Inflamed seborrheic keratosis: Secondary | ICD-10-CM | POA: Diagnosis not present

## 2023-06-20 DIAGNOSIS — E538 Deficiency of other specified B group vitamins: Secondary | ICD-10-CM | POA: Diagnosis not present

## 2023-06-20 DIAGNOSIS — R030 Elevated blood-pressure reading, without diagnosis of hypertension: Secondary | ICD-10-CM | POA: Diagnosis not present

## 2023-06-28 DIAGNOSIS — M0609 Rheumatoid arthritis without rheumatoid factor, multiple sites: Secondary | ICD-10-CM | POA: Diagnosis not present

## 2023-06-30 DIAGNOSIS — S9032XA Contusion of left foot, initial encounter: Secondary | ICD-10-CM | POA: Diagnosis not present

## 2023-06-30 DIAGNOSIS — S93492D Sprain of other ligament of left ankle, subsequent encounter: Secondary | ICD-10-CM | POA: Diagnosis not present

## 2023-07-08 DIAGNOSIS — R051 Acute cough: Secondary | ICD-10-CM | POA: Diagnosis not present

## 2023-07-08 DIAGNOSIS — B349 Viral infection, unspecified: Secondary | ICD-10-CM | POA: Diagnosis not present

## 2023-07-12 ENCOUNTER — Other Ambulatory Visit: Payer: Self-pay | Admitting: Family Medicine

## 2023-07-12 DIAGNOSIS — Z1231 Encounter for screening mammogram for malignant neoplasm of breast: Secondary | ICD-10-CM

## 2023-07-13 DIAGNOSIS — M1711 Unilateral primary osteoarthritis, right knee: Secondary | ICD-10-CM | POA: Diagnosis not present

## 2023-07-14 DIAGNOSIS — R059 Cough, unspecified: Secondary | ICD-10-CM | POA: Diagnosis not present

## 2023-07-14 DIAGNOSIS — Z6827 Body mass index (BMI) 27.0-27.9, adult: Secondary | ICD-10-CM | POA: Diagnosis not present

## 2023-07-14 DIAGNOSIS — J0101 Acute recurrent maxillary sinusitis: Secondary | ICD-10-CM | POA: Diagnosis not present

## 2023-07-20 DIAGNOSIS — M1711 Unilateral primary osteoarthritis, right knee: Secondary | ICD-10-CM | POA: Diagnosis not present

## 2023-07-27 DIAGNOSIS — M1711 Unilateral primary osteoarthritis, right knee: Secondary | ICD-10-CM | POA: Diagnosis not present

## 2023-08-08 DIAGNOSIS — M25579 Pain in unspecified ankle and joints of unspecified foot: Secondary | ICD-10-CM | POA: Diagnosis not present

## 2023-08-10 ENCOUNTER — Ambulatory Visit: Payer: Medicare HMO

## 2023-08-11 DIAGNOSIS — E538 Deficiency of other specified B group vitamins: Secondary | ICD-10-CM | POA: Diagnosis not present

## 2023-08-11 DIAGNOSIS — R030 Elevated blood-pressure reading, without diagnosis of hypertension: Secondary | ICD-10-CM | POA: Diagnosis not present

## 2023-08-22 ENCOUNTER — Ambulatory Visit
Admission: RE | Admit: 2023-08-22 | Discharge: 2023-08-22 | Disposition: A | Discharge status: Home / Self Care | Payer: Medicare HMO | Source: Ambulatory Visit | Attending: Family Medicine | Admitting: Family Medicine

## 2023-08-22 DIAGNOSIS — Z1231 Encounter for screening mammogram for malignant neoplasm of breast: Secondary | ICD-10-CM | POA: Diagnosis not present

## 2023-08-23 DIAGNOSIS — M0609 Rheumatoid arthritis without rheumatoid factor, multiple sites: Secondary | ICD-10-CM | POA: Diagnosis not present

## 2023-08-24 DIAGNOSIS — E119 Type 2 diabetes mellitus without complications: Secondary | ICD-10-CM | POA: Diagnosis not present

## 2023-08-24 DIAGNOSIS — Z961 Presence of intraocular lens: Secondary | ICD-10-CM | POA: Diagnosis not present

## 2023-08-24 DIAGNOSIS — H52203 Unspecified astigmatism, bilateral: Secondary | ICD-10-CM | POA: Diagnosis not present

## 2023-09-01 DIAGNOSIS — S93492D Sprain of other ligament of left ankle, subsequent encounter: Secondary | ICD-10-CM | POA: Diagnosis not present

## 2023-09-04 DIAGNOSIS — M19072 Primary osteoarthritis, left ankle and foot: Secondary | ICD-10-CM | POA: Diagnosis not present

## 2023-09-11 DIAGNOSIS — H524 Presbyopia: Secondary | ICD-10-CM | POA: Diagnosis not present

## 2023-09-11 DIAGNOSIS — H52223 Regular astigmatism, bilateral: Secondary | ICD-10-CM | POA: Diagnosis not present

## 2023-09-21 DIAGNOSIS — M069 Rheumatoid arthritis, unspecified: Secondary | ICD-10-CM | POA: Diagnosis not present

## 2023-09-21 DIAGNOSIS — K2100 Gastro-esophageal reflux disease with esophagitis, without bleeding: Secondary | ICD-10-CM | POA: Diagnosis not present

## 2023-09-21 DIAGNOSIS — Z6827 Body mass index (BMI) 27.0-27.9, adult: Secondary | ICD-10-CM | POA: Diagnosis not present

## 2023-09-21 DIAGNOSIS — E782 Mixed hyperlipidemia: Secondary | ICD-10-CM | POA: Diagnosis not present

## 2023-09-21 DIAGNOSIS — N1831 Chronic kidney disease, stage 3a: Secondary | ICD-10-CM | POA: Diagnosis not present

## 2023-10-06 DIAGNOSIS — M25552 Pain in left hip: Secondary | ICD-10-CM | POA: Diagnosis not present

## 2023-10-25 DIAGNOSIS — M25552 Pain in left hip: Secondary | ICD-10-CM | POA: Diagnosis not present

## 2023-10-25 DIAGNOSIS — M1711 Unilateral primary osteoarthritis, right knee: Secondary | ICD-10-CM | POA: Diagnosis not present

## 2023-10-25 DIAGNOSIS — Z96641 Presence of right artificial hip joint: Secondary | ICD-10-CM | POA: Diagnosis not present

## 2023-10-31 DIAGNOSIS — M810 Age-related osteoporosis without current pathological fracture: Secondary | ICD-10-CM | POA: Diagnosis not present

## 2023-11-03 DIAGNOSIS — M19071 Primary osteoarthritis, right ankle and foot: Secondary | ICD-10-CM | POA: Diagnosis not present

## 2023-11-09 DIAGNOSIS — M25552 Pain in left hip: Secondary | ICD-10-CM | POA: Diagnosis not present

## 2023-11-13 DIAGNOSIS — H35033 Hypertensive retinopathy, bilateral: Secondary | ICD-10-CM | POA: Diagnosis not present

## 2023-11-13 DIAGNOSIS — E113293 Type 2 diabetes mellitus with mild nonproliferative diabetic retinopathy without macular edema, bilateral: Secondary | ICD-10-CM | POA: Diagnosis not present

## 2023-11-13 DIAGNOSIS — H3554 Dystrophies primarily involving the retinal pigment epithelium: Secondary | ICD-10-CM | POA: Diagnosis not present

## 2023-11-13 DIAGNOSIS — H43813 Vitreous degeneration, bilateral: Secondary | ICD-10-CM | POA: Diagnosis not present

## 2023-11-13 DIAGNOSIS — H35372 Puckering of macula, left eye: Secondary | ICD-10-CM | POA: Diagnosis not present

## 2023-11-17 DIAGNOSIS — E538 Deficiency of other specified B group vitamins: Secondary | ICD-10-CM | POA: Diagnosis not present

## 2023-11-21 DIAGNOSIS — M1991 Primary osteoarthritis, unspecified site: Secondary | ICD-10-CM | POA: Diagnosis not present

## 2023-11-21 DIAGNOSIS — E663 Overweight: Secondary | ICD-10-CM | POA: Diagnosis not present

## 2023-11-21 DIAGNOSIS — M0609 Rheumatoid arthritis without rheumatoid factor, multiple sites: Secondary | ICD-10-CM | POA: Diagnosis not present

## 2023-11-21 DIAGNOSIS — Z6825 Body mass index (BMI) 25.0-25.9, adult: Secondary | ICD-10-CM | POA: Diagnosis not present

## 2023-11-29 DIAGNOSIS — H9191 Unspecified hearing loss, right ear: Secondary | ICD-10-CM | POA: Diagnosis not present

## 2023-11-29 DIAGNOSIS — H6121 Impacted cerumen, right ear: Secondary | ICD-10-CM | POA: Diagnosis not present

## 2023-11-29 DIAGNOSIS — Z6827 Body mass index (BMI) 27.0-27.9, adult: Secondary | ICD-10-CM | POA: Diagnosis not present

## 2023-11-29 DIAGNOSIS — H65191 Other acute nonsuppurative otitis media, right ear: Secondary | ICD-10-CM | POA: Diagnosis not present

## 2023-12-04 DIAGNOSIS — M7052 Other bursitis of knee, left knee: Secondary | ICD-10-CM | POA: Diagnosis not present

## 2023-12-04 DIAGNOSIS — M25562 Pain in left knee: Secondary | ICD-10-CM | POA: Diagnosis not present

## 2023-12-04 DIAGNOSIS — Z96652 Presence of left artificial knee joint: Secondary | ICD-10-CM | POA: Diagnosis not present

## 2023-12-12 DIAGNOSIS — M25562 Pain in left knee: Secondary | ICD-10-CM | POA: Diagnosis not present

## 2023-12-12 DIAGNOSIS — Z6827 Body mass index (BMI) 27.0-27.9, adult: Secondary | ICD-10-CM | POA: Diagnosis not present

## 2023-12-19 DIAGNOSIS — E538 Deficiency of other specified B group vitamins: Secondary | ICD-10-CM | POA: Diagnosis not present

## 2023-12-22 DIAGNOSIS — M1712 Unilateral primary osteoarthritis, left knee: Secondary | ICD-10-CM | POA: Diagnosis not present

## 2023-12-22 DIAGNOSIS — M7052 Other bursitis of knee, left knee: Secondary | ICD-10-CM | POA: Diagnosis not present

## 2023-12-28 DIAGNOSIS — M1612 Unilateral primary osteoarthritis, left hip: Secondary | ICD-10-CM | POA: Diagnosis not present

## 2023-12-28 DIAGNOSIS — Z96652 Presence of left artificial knee joint: Secondary | ICD-10-CM | POA: Diagnosis not present

## 2024-01-03 DIAGNOSIS — H8112 Benign paroxysmal vertigo, left ear: Secondary | ICD-10-CM | POA: Diagnosis not present

## 2024-01-03 DIAGNOSIS — R531 Weakness: Secondary | ICD-10-CM | POA: Diagnosis not present

## 2024-01-03 DIAGNOSIS — Z6827 Body mass index (BMI) 27.0-27.9, adult: Secondary | ICD-10-CM | POA: Diagnosis not present

## 2024-01-03 DIAGNOSIS — M1711 Unilateral primary osteoarthritis, right knee: Secondary | ICD-10-CM | POA: Diagnosis not present

## 2024-01-09 DIAGNOSIS — E1165 Type 2 diabetes mellitus with hyperglycemia: Secondary | ICD-10-CM | POA: Diagnosis not present

## 2024-01-16 DIAGNOSIS — K2100 Gastro-esophageal reflux disease with esophagitis, without bleeding: Secondary | ICD-10-CM | POA: Diagnosis not present

## 2024-01-16 DIAGNOSIS — D519 Vitamin B12 deficiency anemia, unspecified: Secondary | ICD-10-CM | POA: Diagnosis not present

## 2024-01-16 DIAGNOSIS — M069 Rheumatoid arthritis, unspecified: Secondary | ICD-10-CM | POA: Diagnosis not present

## 2024-01-16 DIAGNOSIS — N1831 Chronic kidney disease, stage 3a: Secondary | ICD-10-CM | POA: Diagnosis not present

## 2024-01-16 DIAGNOSIS — E782 Mixed hyperlipidemia: Secondary | ICD-10-CM | POA: Diagnosis not present

## 2024-01-16 DIAGNOSIS — Z6827 Body mass index (BMI) 27.0-27.9, adult: Secondary | ICD-10-CM | POA: Diagnosis not present

## 2024-01-21 ENCOUNTER — Emergency Department (HOSPITAL_COMMUNITY)
Admission: EM | Admit: 2024-01-21 | Discharge: 2024-01-21 | Disposition: A | Discharge status: Home / Self Care | Attending: Emergency Medicine | Admitting: Emergency Medicine

## 2024-01-21 ENCOUNTER — Other Ambulatory Visit: Payer: Self-pay

## 2024-01-21 DIAGNOSIS — E119 Type 2 diabetes mellitus without complications: Secondary | ICD-10-CM | POA: Insufficient documentation

## 2024-01-21 DIAGNOSIS — Z7984 Long term (current) use of oral hypoglycemic drugs: Secondary | ICD-10-CM | POA: Diagnosis not present

## 2024-01-21 DIAGNOSIS — I10 Essential (primary) hypertension: Secondary | ICD-10-CM | POA: Diagnosis not present

## 2024-01-21 DIAGNOSIS — Z79899 Other long term (current) drug therapy: Secondary | ICD-10-CM | POA: Insufficient documentation

## 2024-01-21 DIAGNOSIS — M1612 Unilateral primary osteoarthritis, left hip: Secondary | ICD-10-CM | POA: Diagnosis not present

## 2024-01-21 DIAGNOSIS — M25552 Pain in left hip: Secondary | ICD-10-CM

## 2024-01-21 MED ORDER — OXYCODONE-ACETAMINOPHEN 5-325 MG PO TABS: 1.0000 | ORAL_TABLET | Freq: Four times a day (QID) | ORAL | 0 refills | Status: AC | PRN

## 2024-01-21 MED ORDER — DEXAMETHASONE SODIUM PHOSPHATE 10 MG/ML IJ SOLN
10.0000 mg | Freq: Once | INTRAMUSCULAR | Status: AC
  Administered 2024-01-21: 10 mg via INTRAMUSCULAR
  Filled 2024-01-21: qty 1

## 2024-01-21 MED ORDER — OXYCODONE-ACETAMINOPHEN 5-325 MG PO TABS
1.0000 | ORAL_TABLET | Freq: Once | ORAL | Status: AC
  Administered 2024-01-21: 1 via ORAL
  Filled 2024-01-21: qty 1

## 2024-01-21 NOTE — ED Triage Notes (Signed)
 Pt POV from home. Patient states she's having left side hip pain for months and has progressively worsened. Patient falls. Patient states mobility has worsened and she can hear popping and a catching noise as she walks. Patient states she takes tramadol  without relief. Patient to have hip replacement on 02/07/2024 at [facility] with DR. Lucio.

## 2024-01-21 NOTE — ED Provider Notes (Signed)
 [place] [facility]MENT AT [facility] Provider Note   CSN: [id number] Arrival date & time: 01/21/24  1057     Patient presents with: Hip Pain   Carolyn [name] is a 83 y.o. female with a history of osteoarthritis, hypertension, diabetes mellitus presents the ED today for hip pain.  Patient reports chronic left hip pain for which she is following up with orthopedics for this.  She is post to have her hip replaced on 7/2 with Dr. France Ina.  States that her pain is not being controlled by tramadol  anymore.  She woke up multiple times last night secondary to the pain.  States that she hears clicking now with ambulation. Denies fevers, warmth to touch, or redness of the joint space. She maintains range of motion and is still able to ambulate. Patient is requesting medication for the pain at this time.  No additional complaints or concerns at this time.    Prior to Admission medications   Medication Sig Start Date End Date Taking? Authorizing Provider  oxyCODONE -acetaminophen  (PERCOCET/ROXICET) 5-325 MG tablet Take 1 tablet by mouth every 6 (six) hours as needed for up to 5 days for severe pain (pain score 7-10). 01/21/24 01/26/24 Yes Sonnie Dusky, PA-C  Cholecalciferol (VITAMIN D -3) 125 MCG (5000 UT) TABS Take 5,000 Units by mouth daily.    [provider]  citalopram  (CELEXA ) 20 MG tablet Take 20 mg by mouth daily with breakfast.  01/05/12   [provider]  folic acid  (FOLVITE ) 1 MG tablet Take 1 mg by mouth daily.    [provider]  glucose blood (ONETOUCH VERIO) test strip OneTouch Verio test strips    [provider]  hydrocortisone  2.5 % cream Apply topically. 04/19/21   [provider]  imiquimod  (ALDARA ) 5 % cream Apply topically at bedtime. Monday Wednesday friday 12/16/21   Devon Fogo, MD  levocetirizine (XYZAL) 5 MG tablet Take 5 mg by mouth daily. 04/14/21   [provider]  Liniments (SALONPAS PAIN RELIEF PATCH EX)  Place 1 patch onto the skin as needed (pain).    [provider]  losartan -hydrochlorothiazide  (HYZAAR) 100-12.5 MG per tablet Take 1 tablet by mouth daily with breakfast.  12/16/11   [provider]  metFORMIN (GLUCOPHAGE-XR) 500 MG 24 hr tablet Take 1,000 mg by mouth every evening.  11/23/11   [provider]  methocarbamol  (ROBAXIN ) 500 MG tablet Take 1 tablet (500 mg total) by mouth every 6 (six) hours as needed for muscle spasms. Patient taking differently: Take 500 mg by mouth as needed for muscle spasms. 01/23/20   Edmisten, Kristie L, PA  methotrexate 2.5 MG tablet Take by mouth. 06/28/21   [provider]  mupirocin ointment (BACTROBAN) 2 % SMARTSIG:1 Application Topical 2-3 Times Daily 02/15/21   [provider]  omeprazole (PRILOSEC) 20 MG capsule Take 20 mg by mouth daily before breakfast.  11/12/11   [provider]  traMADol  (ULTRAM ) 50 MG tablet Take 1 tablet (50 mg total) by mouth every 8 (eight) hours as needed for severe pain. Patient taking differently: Take 50 mg by mouth every 8 (eight) hours as needed for severe pain (for pain.). 11/10/19   Tonya Fredrickson, MD  traZODone  (DESYREL ) 50 MG tablet Take 100 mg by mouth at bedtime.    [provider]  Zinc 50 MG TABS Take 50 mg by mouth as needed.    [provider]    Allergies: Ace inhibitors, Cephalexin, Statins, Zetia [ezetimibe], and  Sulfa antibiotics    Review of Systems  Musculoskeletal:  Positive for arthralgias.  All other systems reviewed and are negative.   Updated Vital Signs BP (!) 141/93 (BP Location: Right Arm)   Pulse 86   Temp 98 F (36.7 C) (Oral)   Resp 18   SpO2 97%   Physical Exam Vitals and nursing note reviewed.  Constitutional:      General: She is not in acute distress.    Appearance: Normal appearance.  HENT:     Head: Normocephalic and atraumatic.     Mouth/Throat:     Mouth: Mucous membranes are moist.   Eyes:      Conjunctiva/sclera: Conjunctivae normal.     Pupils: Pupils are equal, round, and reactive to light.    Cardiovascular:     Rate and Rhythm: Normal rate and regular rhythm.     Pulses: Normal pulses.     Heart sounds: Normal heart sounds.  Pulmonary:     Effort: Pulmonary effort is normal.     Breath sounds: Normal breath sounds.  Abdominal:     Palpations: Abdomen is soft.     Tenderness: There is no abdominal tenderness.   Musculoskeletal:        General: Tenderness present. Normal range of motion.     Cervical back: Normal range of motion.     Comments: Tenderness to palpation of left anterior hip without warmth to touch, swelling, or erythema. No posterior hip tenderness. ROM of extremities remain intact.   Skin:    General: Skin is warm and dry.     Findings: No rash.   Neurological:     General: No focal deficit present.     Mental Status: She is alert.     Sensory: No sensory deficit.     Motor: No weakness.   Psychiatric:        Mood and Affect: Mood normal.        Behavior: Behavior normal.    (all labs ordered are listed, but only abnormal results are displayed) Labs Reviewed - No data to display  EKG: None  Radiology: No results found.   Procedures   Medications Ordered in the ED  dexamethasone  (DECADRON ) injection 10 mg (10 mg Intramuscular Given 01/21/24 1139)  oxyCODONE -acetaminophen  (PERCOCET/ROXICET) 5-325 MG per tablet 1 tablet (1 tablet Oral Given 01/21/24 1139)                                    Medical Decision Making  This patient presents to the ED for concern of left hip pain, this involves an extensive number of treatment options, and is a complaint that carries with it a high risk of complications and morbidity.   Differential diagnosis includes: chronic hip pain, septic arthritis,   Comorbidities  See HPI above   Additional History  Additional history obtained from prior orthopedic records   Problem List / ED Course /  Critical Interventions / Medication Management  Patient presents to the ED today for chronic left hip pain.  She is supposed to have her hip replaced on 7/2.  States that tramadol  is not helping her pain anymore.  She denies any recent falls or injury to the hip, since last being seen by orthopedics last month.  She is still able to ambulate on her leg.  States that there is a new cracking sound when she ambulates. Pain is at the anterior  left hip without swelling of the joint pain, redness, warmth to touch.  No fevers. ROM maintained despite pain. Low suspicion for septic arthritis.  Patient does not want more imaging of the hip but a different medication to help with the pain until her surgery. I ordered medications including: Decadron  and Percocet for pain relief  Medications given prior to discharge.  Discussed side effects of medications with patient prior to administration.  Patient's family was at bedside who was driving.  Prescription sent to the pharmacy.  Advised orthopedic follow-up for reevaluation.   Social Determinants of Health  Physical activity   Test / Admission - Considered  Patient is stable and safe for discharge home. Return precautions given.    Final diagnoses:  Left hip pain  Osteoarthritis of left hip, unspecified osteoarthritis type    ED Discharge Orders          Ordered    oxyCODONE -acetaminophen  (PERCOCET/ROXICET) 5-325 MG tablet  Every 6 hours PRN        01/21/24 1130               Sonnie Dusky, PA-C 01/21/24 1148    Burnette Carte, MD 01/21/24 1305

## 2024-01-21 NOTE — Discharge Instructions (Addendum)
 Take Percocet every 6 hours as needed for pain.  This medication can cause drowsiness, do not drive while taking this medication.  You can also cause constipation, you can add Dulcolax to your medication regimen if you are not having normal bowel movements.  Steroids can increase your blood sugar, monitor your blood sugar at home to make sure it doesn't get too high.  Follow-up with your orthopedic provider if pain continues to worsen in the interim.  Get help right away if: You fall. You have a sudden increase in pain and swelling in your hip. Your hip is red or swollen or very tender to touch.

## 2024-01-25 DIAGNOSIS — Z96641 Presence of right artificial hip joint: Secondary | ICD-10-CM | POA: Diagnosis not present

## 2024-01-25 DIAGNOSIS — M1612 Unilateral primary osteoarthritis, left hip: Secondary | ICD-10-CM | POA: Diagnosis not present

## 2024-01-26 NOTE — Patient Instructions (Signed)
 SURGICAL WAITING ROOM VISITATION  Patients having surgery or a procedure may have no more than 2 support people in the waiting area - these visitors may rotate.    Children under the age of 34 must have an adult with them who is not the patient.  Visitors with respiratory illnesses are discouraged from visiting and should remain at home.  If the patient needs to stay at the hospital during part of their recovery, the visitor guidelines for inpatient rooms apply. Pre-op nurse will coordinate an appropriate time for 1 support person to accompany patient in pre-op.  This support person may not rotate.    Please refer to the [facility] website for the visitor guidelines for Inpatients (after your surgery is over and you are in a regular room).       Your procedure is scheduled on: 02/07/24   Report to [facility] Main Entrance    Report to admitting at : 10:10 AM   Call this number if you have problems the morning of surgery [phone]   Do not eat food :After Midnight.   After Midnight you may have the following liquids until : 9:40 AM DAY OF SURGERY  Water  Non-Citrus Juices (without pulp, NO RED-Apple, White grape, White cranberry) Black Coffee (NO MILK/CREAM OR CREAMERS, sugar ok)  Clear Tea (NO MILK/CREAM OR CREAMERS, sugar ok) regular and decaf                             Plain Jell-O (NO RED)                                           Fruit ices (not with fruit pulp, NO RED)                                     Popsicles (NO RED)                                                               Sports drinks like Gatorade (NO RED)   The day of surgery:  Drink ONE (1) Pre-Surgery Clear G2 at: 9:40 AM the morning of surgery. Drink in one sitting. Do not sip.  This drink was given to you during your hospital  pre-op appointment visit. Nothing else to drink after completing the  Pre-Surgery Clear Ensure or G2.          If you have questions, please contact your surgeon's  office.  FOLLOW ANY ADDITIONAL PRE OP INSTRUCTIONS YOU RECEIVED FROM YOUR SURGEON'S OFFICE!!!   Oral Hygiene is also important to reduce your risk of infection.                                    Remember - BRUSH YOUR TEETH THE MORNING OF SURGERY WITH YOUR REGULAR TOOTHPASTE  DENTURES WILL BE REMOVED PRIOR TO SURGERY PLEASE DO NOT APPLY Poly grip OR ADHESIVES!!!   Do NOT smoke after Midnight   Stop all vitamins  and herbal supplements 7 days before surgery.   Take these medicines the morning of surgery with A SIP OF WATER : citalopram   DO NOT TAKE ANY ORAL DIABETIC MEDICATIONS DAY OF YOUR SURGERY                              You may not have any metal on your body including hair pins, jewelry, and body piercing             Do not wear make-up, lotions, powders, perfumes/cologne, or deodorant  Do not wear nail polish including gel and S&S, artificial/acrylic nails, or any other type of covering on natural nails including finger and toenails. If you have artificial nails, gel coating, etc. that needs to be removed by a nail salon please have this removed prior to surgery or surgery may need to be canceled/ delayed if the surgeon/ anesthesia feels like they are unable to be safely monitored.   Do not shave  48 hours prior to surgery.    Do not bring valuables to the hospital. [facility] IS NOT             RESPONSIBLE   FOR VALUABLES.   Contacts, glasses, dentures or bridgework may not be worn into surgery.   Bring small overnight bag day of surgery.   DO NOT BRING YOUR HOME MEDICATIONS TO THE HOSPITAL. PHARMACY WILL DISPENSE MEDICATIONS LISTED ON YOUR MEDICATION LIST TO YOU DURING YOUR ADMISSION IN THE HOSPITAL!    Patients discharged on the day of surgery will not be allowed to drive home.  Someone NEEDS to stay with you for the first 24 hours after anesthesia.   Special Instructions: Bring a copy of your healthcare power of attorney and living will documents the day of surgery if  you haven't scanned them before.              Please read over the following fact sheets you were given: IF YOU HAVE QUESTIONS ABOUT YOUR PRE-OP INSTRUCTIONS PLEASE CALL [phone]   If you received a COVID test during your pre-op visit  it is requested that you wear a mask when out in public, stay away from anyone that may not be feeling well and notify your surgeon if you develop symptoms. If you test positive for Covid or have been in contact with anyone that has tested positive in the last 10 days please notify you surgeon.      Pre-operative 5 CHG Bath Instructions   You can play a key role in reducing the risk of infection after surgery. Your skin needs to be as free of germs as possible. You can reduce the number of germs on your skin by washing with CHG (chlorhexidine  gluconate) soap before surgery. CHG is an antiseptic soap that kills germs and continues to kill germs even after washing.   DO NOT use if you have an allergy to chlorhexidine /CHG or antibacterial soaps. If your skin becomes reddened or irritated, stop using the CHG and notify one of our RNs at [phone].   Please shower with the CHG soap starting 4 days before surgery using the following schedule:     Please keep in mind the following:  DO NOT shave, including legs and underarms, starting the day of your first shower.   You may shave your face at any point before/day of surgery.  Place clean sheets on your bed the day you start using CHG soap. Use  a clean washcloth (not used since being washed) for each shower. DO NOT sleep with pets once you start using the CHG.   CHG Shower Instructions:  If you choose to wash your hair and private area, wash first with your normal shampoo/soap.  After you use shampoo/soap, rinse your hair and body thoroughly to remove shampoo/soap residue.  Turn the water  OFF and apply about 3 tablespoons (45 ml) of CHG soap to a CLEAN washcloth.  Apply CHG soap ONLY FROM YOUR NECK DOWN TO  YOUR TOES (washing for 3-5 minutes)  DO NOT use CHG soap on face, private areas, open wounds, or sores.  Pay special attention to the area where your surgery is being performed.  If you are having back surgery, having someone wash your back for you may be helpful. Wait 2 minutes after CHG soap is applied, then you may rinse off the CHG soap.  Pat dry with a clean towel  Put on clean clothes/pajamas   If you choose to wear lotion, please use ONLY the CHG-compatible lotions on the back of this paper.     Additional instructions for the day of surgery: DO NOT APPLY any lotions, deodorants, cologne, or perfumes.   Put on clean/comfortable clothes.  Brush your teeth.  Ask your nurse before applying any prescription medications to the skin.   CHG Compatible Lotions   Aveeno Moisturizing lotion  Cetaphil Moisturizing Cream  Cetaphil Moisturizing Lotion  Clairol Herbal Essence Moisturizing Lotion, Dry Skin  Clairol Herbal Essence Moisturizing Lotion, Extra Dry Skin  Clairol Herbal Essence Moisturizing Lotion, Normal Skin  Curel Age Defying Therapeutic Moisturizing Lotion with Alpha Hydroxy  Curel Extreme Care Body Lotion  Curel Soothing Hands Moisturizing Hand Lotion  Curel Therapeutic Moisturizing Cream, Fragrance-Free  Curel Therapeutic Moisturizing Lotion, Fragrance-Free  Curel Therapeutic Moisturizing Lotion, Original Formula  Eucerin Daily Replenishing Lotion  Eucerin Dry Skin Therapy Plus Alpha Hydroxy Crme  Eucerin Dry Skin Therapy Plus Alpha Hydroxy Lotion  Eucerin Original Crme  Eucerin Original Lotion  Eucerin Plus Crme Eucerin Plus Lotion  Eucerin TriLipid Replenishing Lotion  Keri Anti-Bacterial Hand Lotion  Keri Deep Conditioning Original Lotion Dry Skin Formula Softly Scented  Keri Deep Conditioning Original Lotion, Fragrance Free Sensitive Skin Formula  Keri Lotion Fast Absorbing Fragrance Free Sensitive Skin Formula  Keri Lotion Fast Absorbing Softly Scented Dry  Skin Formula  Keri Original Lotion  Keri Skin Renewal Lotion Keri Silky Smooth Lotion  Keri Silky Smooth Sensitive Skin Lotion  Nivea Body Creamy Conditioning Oil  Nivea Body Extra Enriched Lotion  Nivea Body Original Lotion  Nivea Body Sheer Moisturizing Lotion Nivea Crme  Nivea Skin Firming Lotion  NutraDerm 30 Skin Lotion  NutraDerm Skin Lotion  NutraDerm Therapeutic Skin Cream  NutraDerm Therapeutic Skin Lotion  ProShield Protective Hand Cream  Provon moisturizing lotion   Incentive Spirometer  An incentive spirometer is a tool that can help keep your lungs clear and active. This tool measures how well you are filling your lungs with each breath. Taking long deep breaths may help reverse or decrease the chance of developing breathing (pulmonary) problems (especially infection) following: A long period of time when you are unable to move or be active. BEFORE THE PROCEDURE  If the spirometer includes an indicator to show your best effort, your nurse or respiratory therapist will set it to a desired goal. If possible, sit up straight or lean slightly forward. Try not to slouch. Hold the incentive spirometer in an upright position. INSTRUCTIONS FOR USE  Sit on the edge of your bed if possible, or sit up as far as you can in bed or on a chair. Hold the incentive spirometer in an upright position. Breathe out normally. Place the mouthpiece in your mouth and seal your lips tightly around it. Breathe in slowly and as deeply as possible, raising the piston or the ball toward the top of the column. Hold your breath for 3-5 seconds or for as long as possible. Allow the piston or ball to fall to the bottom of the column. Remove the mouthpiece from your mouth and breathe out normally. Rest for a few seconds and repeat Steps 1 through 7 at least 10 times every 1-2 hours when you are awake. Take your time and take a few normal breaths between deep breaths. The spirometer may include an  indicator to show your best effort. Use the indicator as a goal to work toward during each repetition. After each set of 10 deep breaths, practice coughing to be sure your lungs are clear. If you have an incision (the cut made at the time of surgery), support your incision when coughing by placing a pillow or rolled up towels firmly against it. Once you are able to get out of bed, walk around indoors and cough well. You may stop using the incentive spirometer when instructed by your caregiver.  RISKS AND COMPLICATIONS Take your time so you do not get dizzy or light-headed. If you are in pain, you may need to take or ask for pain medication before doing incentive spirometry. It is harder to take a deep breath if you are having pain. AFTER USE Rest and breathe slowly and easily. It can be helpful to keep track of a log of your progress. Your caregiver can provide you with a simple table to help with this. If you are using the spirometer at home, follow these instructions: SEEK MEDICAL CARE IF:  You are having difficultly using the spirometer. You have trouble using the spirometer as often as instructed. Your pain medication is not giving enough relief while using the spirometer. You develop fever of 100.5 F (38.1 C) or higher. SEEK IMMEDIATE MEDICAL CARE IF:  You cough up bloody sputum that had not been present before. You develop fever of 102 F (38.9 C) or greater. You develop worsening pain at or near the incision site. MAKE SURE YOU:  Understand these instructions. Will watch your condition. Will get help right away if you are not doing well or get worse. Document Released: 12/05/2006 Document Revised: 10/17/2011 Document Reviewed: 02/05/2007 [facility] Patient Information 2014 [place], [place].   ________________________________________________________________________

## 2024-01-29 ENCOUNTER — Encounter (HOSPITAL_COMMUNITY)
Admission: RE | Admit: 2024-01-29 | Discharge: 2024-01-29 | Disposition: A | Discharge status: Home / Self Care | Source: Ambulatory Visit | Attending: Anesthesiology | Admitting: Anesthesiology

## 2024-01-29 DIAGNOSIS — I10 Essential (primary) hypertension: Secondary | ICD-10-CM

## 2024-01-29 DIAGNOSIS — E119 Type 2 diabetes mellitus without complications: Secondary | ICD-10-CM

## 2024-01-29 DIAGNOSIS — Z01818 Encounter for other preprocedural examination: Secondary | ICD-10-CM

## 2024-01-29 NOTE — Progress Notes (Signed)
 Pt. Did not come for PST appointment today,she will be reschedule.

## 2024-01-29 NOTE — H&P (Signed)
 TOTAL HIP ADMISSION H&P  Patient is admitted for left total hip arthroplasty.  Subjective:  Chief Complaint: Left hip pain  HPI: Carolyn [name], 83 y.o. female, has a history of pain and functional disability in the left hip due to arthritis and patient has failed non-surgical conservative treatments for greater than 12 weeks to include NSAID's and/or analgesics, use of assistive devices, and activity modification. Onset of symptoms was abrupt, starting < 1 year ago with rapidlly worsening course since that time. The patient noted no past surgery on the left hip. Patient currently rates pain in the left hip at 9 out of 10 with activity. Patient has night pain, worsening of pain with activity and weight bearing, and trendelenberg gait. Patient has evidence of bone-on-bone arthritis in the left hip by imaging studies. This condition presents safety issues increasing the risk of falls. There is no current active infection.  Patient Active Problem List   Diagnosis Date Noted   OA (osteoarthritis) of hip 01/22/2020   Primary osteoarthritis of right hip 01/22/2020   Syncope and collapse 11/26/2019   Abdominal bloating 08/22/2019   Lower abdominal pain 08/22/2019   OA (osteoarthritis) of knee 05/07/2018   Hyperparathyroidism, primary (HCC) 04/25/2017   Falls 07/19/2016   Precordial chest pain 12/23/2014   Hypercalcemia 12/23/2014   Diabetes (HCC) 10/23/2013   HTN (hypertension) 10/23/2013    Past Medical History:  Diagnosis Date   Anxiety    Arthritis    RA   CKD (chronic kidney disease)    pt. denies   Complication of anesthesia     my heart rate gets low   Depression    Depression    Diabetes mellitus (HCC)    type 2   GERD (gastroesophageal reflux disease)    Hepatic steatosis    Hyperlipidemia    Hyperparathyroidism, primary (HCC)    Hypertension    IBS (irritable bowel syndrome)    Internal hemorrhoid    OA (osteoarthritis)    Syncope    Tubular adenoma of colon 1999    colonoscopy   Wears dentures    Wears glasses     Past Surgical History:  Procedure Laterality Date   ANKLE FRACTURE SURGERY Left yrs ago   BACK SURGERY  04/2011   lower fpr spinal stenosis and ruptured disc   COLONOSCOPY W/ BIOPSIES AND POLYPECTOMY     LAMINECTOMY  2009   Dr. Heide   LUMBAR DISC SURGERY  04/13/2011   L3, L4 Dr. Runell   PARATHYROIDECTOMY Right 04/27/2017   Procedure: RIGHT PARATHYROIDECTOMY;  Surgeon: Eletha Boas, MD;  Location: [facility] OR;  Service: General;  Laterality: Right;   TONSILLECTOMY  as child   and adenoids   TOTAL HIP ARTHROPLASTY Right 01/22/2020   Procedure: TOTAL HIP ARTHROPLASTY ANTERIOR APPROACH;  Surgeon: Melodi Lerner, MD;  Location: [facility];  Service: Orthopedics;  Laterality: Right;    TOTAL KNEE ARTHROPLASTY Left 05/07/2018   Procedure: LEFT TOTAL KNEE ARTHROPLASTY;  Surgeon: Melodi Lerner, MD;  Location: [facility];  Service: Orthopedics;  Laterality: Left;  50 mins   VAGINAL HYSTERECTOMY  1964   partial   vein stripping R leg      Prior to Admission medications   Medication Sig Start Date End Date Taking? Authorizing Provider  citalopram  (CELEXA ) 20 MG tablet Take 20 mg by mouth daily with breakfast.  01/05/12  Yes [provider]  folic acid  (FOLVITE ) 1 MG tablet Take 1 mg by mouth in the morning.  Yes [provider]  hydrocortisone  2.5 % cream Apply 1 Application topically 3 (three) times daily as needed (irritation.). 04/19/21  Yes [provider]  Liniments (SALONPAS PAIN RELIEF PATCH EX) Place 1 patch onto the skin as needed (pain).   Yes [provider]  losartan -hydrochlorothiazide  (HYZAAR) 100-12.5 MG per tablet Take 1 tablet by mouth daily with breakfast.  12/16/11  Yes [provider]  metFORMIN (GLUCOPHAGE-XR) 500 MG 24 hr tablet Take 1,000 mg by mouth in the morning. 11/23/11  Yes [provider]  methotrexate 2.5 MG tablet Take 25 mg by mouth every Monday. 06/28/21  Yes  [provider]  mupirocin ointment (BACTROBAN) 2 % Apply 1 Application topically 3 (three) times daily as needed (skin infection.). 02/15/21  Yes [provider]  omeprazole (PRILOSEC) 20 MG capsule Take 20 mg by mouth daily before breakfast.  11/12/11  Yes [provider]  traMADol  (ULTRAM ) 50 MG tablet Take 1 tablet (50 mg total) by mouth every 8 (eight) hours as needed for severe pain. Patient taking differently: Take 50 mg by mouth every 8 (eight) hours as needed for severe pain (pain score 7-10) (for pain.). 11/10/19  Yes Towana Ozell BROCKS, MD  traZODone  (DESYREL ) 50 MG tablet Take 25 mg by mouth at bedtime.   Yes [provider]  glucose blood (ONETOUCH VERIO) test strip OneTouch Verio test strips    [provider]    Allergies  Allergen Reactions   Ace Inhibitors     cough   Cephalexin     Unknown reaction   Prednisone Other (See Comments)    insomnia   Statins Other (See Comments)    Muscle pain/myalgias   Zetia [Ezetimibe]     headache   Sulfa Antibiotics Rash    Social History   Socioeconomic History   Marital status: Widowed    Spouse name: Not on file   Number of children: 3   Years of education: 10   Highest education level: Not on file  Occupational History   Occupation: retired  Tobacco Use   Smoking status: Never   Smokeless tobacco: Never  Vaping Use   Vaping status: Never Used  Substance and Sexual Activity   Alcohol  use: Never   Drug use: Never   Sexual activity: Not Currently  Other Topics Concern   Not on file  Social History Narrative   Lives alone   Drinks caffeine free diet soda daily   Left-handed   Social Drivers of Health   Financial Resource Strain: Not on file  Food Insecurity: Not on file  Transportation Needs: Not on file  Physical Activity: Not on file  Stress: Not on file  Social Connections: Unknown (12/21/2021)   [facility]   Social Network    Social Network: Not on  file  Intimate Partner Violence: Unknown (11/12/2021)   Received from [facility]   HITS    Physically Hurt: Not on file    Insult or Talk Down To: Not on file    Threaten Physical Harm: Not on file    Scream or Curse: Not on file    Tobacco Use: Low Risk  (08/22/2023)   Patient History    Smoking Tobacco Use: Never    Smokeless Tobacco Use: Never    Passive Exposure: Not on file   Social History   Substance and Sexual Activity  Alcohol  Use Never    Family History  Problem Relation Age of Onset   Alzheimer's  disease Mother    Suicidality Father    Stomach cancer Sister    Diabetes Sister    Colon polyps Sister        hyperplastic   High blood pressure Sister    Neuropathy Daughter    Colon cancer Neg Hx    Breast cancer Neg Hx     ROS   Objective:  Physical Exam: Well nourished and well developed.  General: Alert and oriented x3, cooperative and pleasant, no acute distress.  Head: normocephalic, atraumatic, neck supple.  Eyes: EOMI.  Abdomen: non-tender to palpation and soft, normoactive bowel sounds. Musculoskeletal: - Evaluation of the left hip demonstrates flexion at 90 degrees with no internal rotation, approximately 10 degrees of external rotation, and 20 degrees of abduction. - Crepitus noted on the range of motion of the left hip - Gait pattern significantly antalgic on he left with a walker Calves soft and nontender. Motor function intact in LE. Strength 5/5 LE bilaterally. Neuro: Distal pulses 2+. Sensation to light touch intact in LE.  Vital signs in last 24 hours:    Imaging Review Plain radiographs demonstrate severe degenerative joint disease of the left hip. The bone quality appears to be adequate for age and reported activity level.  Assessment/Plan:  End stage arthritis, left hip  The patient history, physical examination, clinical judgement of the provider and imaging studies are consistent with end stage degenerative joint disease of  the left hip and total hip arthroplasty is deemed medically necessary. The treatment options including medical management, injection therapy, arthroscopy and arthroplasty were discussed at length. The risks and benefits of total hip arthroplasty were presented and reviewed. The risks due to aseptic loosening, infection, stiffness, dislocation/subluxation, thromboembolic complications and other imponderables were discussed. The patient acknowledged the explanation, agreed to proceed with the plan and consent was signed. Patient is being admitted for inpatient treatment for surgery, pain control, PT, OT, prophylactic antibiotics, VTE prophylaxis, progressive ambulation and ADLs and discharge planning.The patient is planning to be discharged home.  Therapy Plans: HEP  Disposition: Home with Daughter Planned DVT Prophylaxis: Aspirin  81 mg BID DME Needed: None PCP: Jerel Sieving, MD (clearance received)  TXA: IV Allergies: ACE inhibitors (cough), cephalexin (rash), prednisone (insomnia), statins (myalgias), sulfa (rash) Anesthesia Concerns: None  BMI: 27 Last HgbA1c: 6.3% - 09/2023  Pharmacy: [facility] [place], [place])  Other: -Currently takes tramadol  1 tablet TID  - Patient was instructed on what medications to stop prior to surgery. - Follow-up visit in 2 weeks with Dr. Melodi - Begin physical therapy following surgery - Pre-operative lab work as pre-surgical testing - Prescriptions will be provided in hospital at time of discharge  R. Zelda Kobs, PA-C Orthopedic Surgery [facility] [place]

## 2024-01-29 NOTE — Progress Notes (Signed)
 COVID Vaccine received:  []  No [x]  Yes Date of any COVID positive Test in last 90 days:  none  PCP - Jerel MATSU. Toribio MD   medical clearance scanned to [facility] 01-08-24 Cardiologist - Ozell Fell, MD  (LOV 01-01-2020) Rheumatology- [address] [place], [place]   [phone]   Chest x-ray - 11-10-2019  1v  Epic EKG -  11-11-2019    Will repeat Stress Test - 01-15-2015  Epic ECHO - 12-18-2019  Epic     Cardiac Cath -  CT Coronary Calcium score:   Pacemaker / ICD device [x]  No []  Yes   Spinal Cord Stimulator:[x]  No []  Yes       History of Sleep Apnea? [x]  No []  Yes   CPAP used?- [x]  No []  Yes    Does the patient monitor blood sugar?   []  N/A   []  No [x]  Yes  Patient has: []  NO Hx DM   []  Pre-DM   []  DM1  [x]   DM2 Last A1c was: 6.3 on   09-15-2023     Does patient have a [employer] or Dexcom? [x]  No []  Yes   Fasting Blood Sugar Ranges- 100-160 Checks Blood Sugar -2 times a week Metformin 1000mg  q am -  Hold DOS  Blood Thinner / Instructions:  none Aspirin  Instructions:   none  ERAS Protocol Ordered: []  No  [x]  Yes PRE-SURGERY []  ENSURE  [x]  G2   Patient is to be NPO after: 0930  Dental hx: [x]  Dentures: Upper plate, nothing on bottom  []  N/A      []  Bridge or Partial:                  []  Loose or Damaged teeth:   Comments: Patient was given the 5 CHG shower / bath instructions for THA surgery along with 2 bottles of the CHG soap. Patient will start this on:    02-03-24          Activity level: Able to walk up 2 flights of stairs without becoming significantly short of breath or having chest pain?  []  No   [x]    Yes,  but would have severe leg pain  Anesthesia review:  PFO - +bubble study (CT 2021) DM2, syncope, GERD, HTN, RA, fatty liver,   Per, Harlene Ward, PA- pt does not need a cardiology clearance. She has known PFO but at 83 yo, she is not a candidate for surgery. Patient denies shortness of breath, fever, cough and chest pain at PAT appointment.  Patient verbalized  understanding and agreement to the Pre-Surgical Instructions that were given to them at this PAT appointment. Patient was also educated of the need to review these PAT instructions again prior to her surgery.I reviewed the appropriate phone numbers to call if they have any and questions or concerns.

## 2024-01-29 NOTE — Patient Instructions (Addendum)
 SURGICAL WAITING ROOM VISITATION Patients having surgery or a procedure may have no more than 2 support people in the waiting area - these visitors may rotate in the visitor waiting room.   If the patient needs to stay at the hospital during part of their recovery, the visitor guidelines for inpatient rooms apply.  PRE-OP VISITATION  Pre-op nurse will coordinate an appropriate time for 1 support person to accompany the patient in pre-op.  This support person may not rotate.  This visitor will be contacted when the time is appropriate for the visitor to come back in the pre-op area.  Please refer to the [facility] website for the visitor guidelines for Inpatients (after your surgery is over and you are in a regular room).  You are not required to quarantine at this time prior to your surgery. However, you must do this: Hand Hygiene often Do NOT share personal items Notify your provider if you are in close contact with someone who has COVID or you develop fever 100.4 or greater, new onset of sneezing, cough, sore throat, shortness of breath or body aches.  If you test positive for Covid or have been in contact with anyone that has tested positive in the last 10 days please notify you surgeon.    Your procedure is scheduled on:  [facility]  February 07, 2024  Report to [facility] Main Entrance: [address] entrance where the [employer] is available.   Report to admitting at: 10:00  AM  Call this number if you have any questions or problems the morning of surgery [phone]  Do not eat food after Midnight the night prior to your surgery/procedure.  After Midnight you may have the following liquids until  09:30 AM DAY OF SURGERY  Clear Liquid Diet Water  Black Coffee (sugar ok, NO MILK/CREAM OR CREAMERS)  Tea (sugar ok, NO MILK/CREAM OR CREAMERS) regular and decaf                             Plain Jell-O  with no fruit (NO RED)                                           Fruit ices  (not with fruit pulp, NO RED)                                     Popsicles (NO RED)                                                                  Juice: NO CITRUS JUICES: only apple, WHITE grape, WHITE cranberry Sports drinks like Gatorade or Powerade (NO RED)                   The day of surgery:  Drink ONE (1) Pre-Surgery G2 at 09:30 AM the morning of surgery. Drink in one sitting. Do not sip.  This drink was given to you during your hospital pre-op appointment visit. Nothing else to drink after completing the Pre-Surgery G2 :  No candy, chewing gum or throat lozenges.    FOLLOW ANY ADDITIONAL PRE OP INSTRUCTIONS YOU RECEIVED FROM YOUR SURGEON'S OFFICE!!!   Oral Hygiene is also important to reduce your risk of infection.        Remember - BRUSH YOUR TEETH THE MORNING OF SURGERY WITH YOUR REGULAR TOOTHPASTE  Do NOT smoke after Midnight the night before surgery.  STOP TAKING all Vitamins, Herbs and supplements 1 week before your surgery.   METHOTREXATE- continue taking except DO NOT TAKE the morning of your surgery.   Take ONLY these medicines the morning of surgery with A SIP OF WATER : Citalopram , Omeprazole and Tramadol  if needed for pain.                    You may not have any metal on your body including hair pins, jewelry, and body piercing  Do not wear make-up, lotions, powders, perfumes, or deodorant  Do not wear nail polish including gel and S&S, artificial / acrylic nails, or any other type of covering on natural nails including finger and toenails. If you have artificial nails, gel coating, etc., that needs to be removed by a nail salon, Please have this removed prior to surgery. Not doing so may mean that your surgery could be cancelled or delayed if the Surgeon or anesthesia staff feels like they are unable to monitor you safely.   Do not shave 48 hours prior to surgery to avoid nicks in your skin which may contribute to postoperative infections.    Contacts,  Hearing Aids, dentures or bridgework may not be worn into surgery. DENTURES WILL BE REMOVED PRIOR TO SURGERY PLEASE DO NOT APPLY Poly grip OR ADHESIVES!!!  You may bring a small overnight bag with you on the day of surgery, only pack items that are not valuable. [facility] IS NOT RESPONSIBLE   FOR VALUABLES THAT ARE LOST OR STOLEN.   Do not bring your home medications to the hospital. The Pharmacy will dispense medications listed on your medication list to you during your admission in the Hospital.  Special Instructions: Bring a copy of your healthcare power of attorney and living will documents the day of surgery, if you wish to have them scanned into your [facility] Records- EPIC  Please read over the following fact sheets you were given: IF YOU HAVE QUESTIONS ABOUT YOUR PRE-OP INSTRUCTIONS, PLEASE CALL [phone].     Pre-operative 5 CHG Bath Instructions   You can play a key role in reducing the risk of infection after surgery. Your skin needs to be as free of germs as possible. You can reduce the number of germs on your skin by washing with CHG (chlorhexidine  gluconate) soap before surgery. CHG is an antiseptic soap that kills germs and continues to kill germs even after washing.   DO NOT use if you have an allergy to chlorhexidine /CHG or antibacterial soaps. If your skin becomes reddened or irritated, stop using the CHG and notify one of our RNs at [phone]  Please shower with the CHG soap starting 4 days before surgery using the following schedule: START SHOWERS ON   [facility]  JUNE 28,2025  Please keep in mind the following:  DO NOT shave, including legs and underarms, starting the day of your first shower.   You may shave your face at any point before/day of surgery.   Place clean sheets on your  bed the day you start using CHG soap. Use a clean washcloth (not used since being washed) for each shower. DO NOT sleep with pets once you start using the CHG.   CHG Shower Instructions:  If you choose to wash your hair and private area, wash first with your normal shampoo/soap.  After you use shampoo/soap, rinse your hair and body thoroughly to remove shampoo/soap residue.  Turn the water  OFF and apply about 3 tablespoons (45 ml) of CHG soap to a CLEAN washcloth.  Apply CHG soap ONLY FROM YOUR NECK DOWN TO YOUR TOES (washing for 3-5 minutes)  DO NOT use CHG soap on face, private areas, open wounds, or sores.  Pay special attention to the area where your surgery is being performed.  If you are having back surgery, having someone wash your back for you may be helpful.  Wait 2 minutes after CHG soap is applied, then you may rinse off the CHG soap.  Pat dry with a clean towel  Put on clean clothes/pajamas   If you choose to wear lotion, please use ONLY the CHG-compatible lotions on the back of this paper.     Additional instructions for the day of surgery: DO NOT APPLY any lotions, deodorants, cologne, or perfumes.   Put on clean/comfortable clothes.  Brush your teeth.  Ask your nurse before applying any prescription medications to the skin.      CHG Compatible Lotions   Aveeno Moisturizing lotion  Cetaphil Moisturizing Cream  Cetaphil Moisturizing Lotion  Clairol Herbal Essence Moisturizing Lotion, Dry Skin  Clairol Herbal Essence Moisturizing Lotion, Extra Dry Skin  Clairol Herbal Essence Moisturizing Lotion, Normal Skin  Curel Age Defying Therapeutic Moisturizing Lotion with Alpha Hydroxy  Curel Extreme Care Body Lotion  Curel Soothing Hands Moisturizing Hand Lotion  Curel Therapeutic Moisturizing Cream, Fragrance-Free  Curel Therapeutic Moisturizing Lotion, Fragrance-Free  Curel Therapeutic Moisturizing Lotion, Original Formula  Eucerin Daily Replenishing Lotion  Eucerin  Dry Skin Therapy Plus Alpha Hydroxy Crme  Eucerin Dry Skin Therapy Plus Alpha Hydroxy Lotion  Eucerin Original Crme  Eucerin Original Lotion  Eucerin Plus Crme Eucerin Plus Lotion  Eucerin TriLipid Replenishing Lotion  Keri Anti-Bacterial Hand Lotion  Keri Deep Conditioning Original Lotion Dry Skin Formula Softly Scented  Keri Deep Conditioning Original Lotion, Fragrance Free Sensitive Skin Formula  Keri Lotion Fast Absorbing Fragrance Free Sensitive Skin Formula  Keri Lotion Fast Absorbing Softly Scented Dry Skin Formula  Keri Original Lotion  Keri Skin Renewal Lotion Keri Silky Smooth Lotion  Keri Silky Smooth Sensitive Skin Lotion  Nivea Body Creamy Conditioning Oil  Nivea Body Extra Enriched Lotion  Nivea Body Original Lotion  Nivea Body Sheer Moisturizing Lotion Nivea Crme  Nivea Skin Firming Lotion  NutraDerm 30 Skin Lotion  NutraDerm Skin Lotion  NutraDerm Therapeutic Skin Cream  NutraDerm Therapeutic Skin Lotion  ProShield Protective Hand Cream  Provon moisturizing lotion   FAILURE TO FOLLOW THESE INSTRUCTIONS MAY RESULT IN THE CANCELLATION OF YOUR SURGERY  PATIENT SIGNATURE_________________________________  NURSE SIGNATURE__________________________________  ________________________________________________________________________          Carolyn [name]    An incentive spirometer is a tool that can help keep your lungs clear and active. This tool measures how well you are filling your lungs with  each breath. Taking long deep breaths may help reverse or decrease the chance of developing breathing (pulmonary) problems (especially infection) following: A long period of time when you are unable to move or be active. BEFORE THE PROCEDURE  If the spirometer includes an indicator to show your best effort, your nurse or respiratory therapist will set it to a desired goal. If possible, sit up straight or lean slightly forward. Try not to slouch. Hold the  incentive spirometer in an upright position. INSTRUCTIONS FOR USE  Sit on the edge of your bed if possible, or sit up as far as you can in bed or on a chair. Hold the incentive spirometer in an upright position. Breathe out normally. Place the mouthpiece in your mouth and seal your lips tightly around it. Breathe in slowly and as deeply as possible, raising the piston or the ball toward the top of the column. Hold your breath for 3-5 seconds or for as long as possible. Allow the piston or ball to fall to the bottom of the column. Remove the mouthpiece from your mouth and breathe out normally. Rest for a few seconds and repeat Steps 1 through 7 at least 10 times every 1-2 hours when you are awake. Take your time and take a few normal breaths between deep breaths. The spirometer may include an indicator to show your best effort. Use the indicator as a goal to work toward during each repetition. After each set of 10 deep breaths, practice coughing to be sure your lungs are clear. If you have an incision (the cut made at the time of surgery), support your incision when coughing by placing a pillow or rolled up towels firmly against it. Once you are able to get out of bed, walk around indoors and cough well. You may stop using the incentive spirometer when instructed by your caregiver.  RISKS AND COMPLICATIONS Take your time so you do not get dizzy or light-headed. If you are in pain, you may need to take or ask for pain medication before doing incentive spirometry. It is harder to take a deep breath if you are having pain. AFTER USE Rest and breathe slowly and easily. It can be helpful to keep track of a log of your progress. Your caregiver can provide you with a simple table to help with this. If you are using the spirometer at home, follow these instructions: SEEK MEDICAL CARE IF:  You are having difficultly using the spirometer. You have trouble using the spirometer as often as instructed. Your  pain medication is not giving enough relief while using the spirometer. You develop fever of 100.5 F (38.1 C) or higher.                                                                                                    SEEK IMMEDIATE MEDICAL CARE IF:  You cough up bloody sputum that had not been present before. You develop fever of 102 F (38.9 C) or greater. You develop worsening pain at or near the incision site. MAKE SURE YOU:  Understand these instructions. Will  watch your condition. Will get help right away if you are not doing well or get worse. Document Released: 12/05/2006 Document Revised: 10/17/2011 Document Reviewed: 02/05/2007 [facility] Patient Information 2014 [place], [place].       If you would like to see a video about joint replacement:   [email or web address]

## 2024-01-30 ENCOUNTER — Encounter (HOSPITAL_COMMUNITY): Payer: Self-pay

## 2024-01-30 ENCOUNTER — Encounter (HOSPITAL_COMMUNITY)
Admission: RE | Admit: 2024-01-30 | Discharge: 2024-01-30 | Disposition: A | Discharge status: Home / Self Care | Source: Ambulatory Visit | Attending: Orthopedic Surgery | Admitting: Orthopedic Surgery

## 2024-01-30 ENCOUNTER — Other Ambulatory Visit: Payer: Self-pay

## 2024-01-30 VITALS — BP 152/86 | HR 86 | Temp 98.0°F | Resp 16 | Ht 65.0 in | Wt 162.0 lb

## 2024-01-30 DIAGNOSIS — E119 Type 2 diabetes mellitus without complications: Secondary | ICD-10-CM | POA: Diagnosis not present

## 2024-01-30 DIAGNOSIS — Z01818 Encounter for other preprocedural examination: Secondary | ICD-10-CM | POA: Insufficient documentation

## 2024-01-30 DIAGNOSIS — Z79631 Long term (current) use of antimetabolite agent: Secondary | ICD-10-CM | POA: Diagnosis not present

## 2024-01-30 DIAGNOSIS — I10 Essential (primary) hypertension: Secondary | ICD-10-CM | POA: Insufficient documentation

## 2024-01-30 DIAGNOSIS — Z79899 Other long term (current) drug therapy: Secondary | ICD-10-CM | POA: Insufficient documentation

## 2024-01-30 LAB — CBC
HCT: 38.7 % (ref 36.0–46.0)
Hemoglobin: 12.8 g/dL (ref 12.0–15.0)
MCH: 32.2 pg (ref 26.0–34.0)
MCHC: 33.1 g/dL (ref 30.0–36.0)
MCV: 97.2 fL (ref 80.0–100.0)
Platelets: 326 10*3/uL (ref 150–400)
RBC: 3.98 MIL/uL (ref 3.87–5.11)
RDW: 12.9 % (ref 11.5–15.5)
WBC: 6.1 10*3/uL (ref 4.0–10.5)
nRBC: 0 % (ref 0.0–0.2)

## 2024-01-30 LAB — SURGICAL PCR SCREEN
MRSA, PCR: NEGATIVE
Staphylococcus aureus: NEGATIVE

## 2024-01-30 LAB — COMPREHENSIVE METABOLIC PANEL WITH GFR
ALT: 14 U/L (ref 0–44)
AST: 19 U/L (ref 15–41)
Albumin: 4 g/dL (ref 3.5–5.0)
Alkaline Phosphatase: 98 U/L (ref 38–126)
Anion gap: 11 (ref 5–15)
BUN: 13 mg/dL (ref 8–23)
CO2: 28 mmol/L (ref 22–32)
Calcium: 9.9 mg/dL (ref 8.9–10.3)
Chloride: 92 mmol/L — ABNORMAL LOW (ref 98–111)
Creatinine, Ser: 0.89 mg/dL (ref 0.44–1.00)
GFR, Estimated: >60 mL/min (ref >60)
Glucose, Bld: 110 mg/dL — ABNORMAL HIGH (ref 70–99)
Potassium: 4 mmol/L (ref 3.5–5.1)
Sodium: 131 mmol/L — ABNORMAL LOW (ref 135–145)
Total Bilirubin: 0.9 mg/dL (ref 0.0–1.2)
Total Protein: 7.1 g/dL (ref 6.5–8.1)

## 2024-01-30 LAB — GLUCOSE, CAPILLARY: Glucose-Capillary: 115 mg/dL — ABNORMAL HIGH (ref 70–99)

## 2024-01-30 LAB — HEMOGLOBIN A1C
Hgb A1c MFr Bld: 6 % — ABNORMAL HIGH (ref 4.8–5.6)
Mean Plasma Glucose: 125.5 mg/dL

## 2024-01-30 LAB — TYPE AND SCREEN
ABO/RH(D): O POS
Antibody Screen: NEGATIVE

## 2024-02-02 NOTE — Anesthesia Preprocedure Evaluation (Signed)
 Anesthesia Evaluation  Patient identified by MRN, date of birth, ID band Patient awake    Reviewed: Allergy & Precautions, NPO status , Patient's Chart, lab work & pertinent test results  Airway Mallampati: II  TM Distance: >3 FB Neck ROM: Full    Dental  (+) Edentulous Upper, Upper Dentures, Dental Advisory Given   Pulmonary neg pulmonary ROS   Pulmonary exam normal breath sounds clear to auscultation       Cardiovascular hypertension, Pt. on medications Normal cardiovascular exam+ Valvular Problems/Murmurs (PFO)  Rhythm:Regular Rate:Normal     Neuro/Psych  PSYCHIATRIC DISORDERS Anxiety Depression    negative neurological ROS     GI/Hepatic Neg liver ROS,GERD  ,,Hepatic steatosis   Endo/Other  diabetes, Type 2, Oral Hypoglycemic Agents  hyperparathyroidism  Renal/GU Renal disease  negative genitourinary   Musculoskeletal  (+) Arthritis , Rheumatoid disorders,    Abdominal   Peds  Hematology negative hematology ROS (+)   Anesthesia Other Findings Echo 12/18/2019 1. Left ventricular ejection fraction, by estimation, is 60 to 65%. The  left ventricle has normal function. The left ventricle has no regional  wall motion abnormalities. There is moderate left ventricular hypertrophy.  Left ventricular diastolic  parameters are consistent with Grade I diastolic dysfunction (impaired  relaxation).   2. Right ventricular systolic function is normal. The right ventricular  size is normal. There is normal pulmonary artery systolic pressure.   3. The mitral valve is grossly normal. Trivial mitral valve  regurgitation.   4. The aortic valve is tricuspid. Aortic valve regurgitation is not  visualized.   5. The inferior vena cava is normal in size with greater than 50%  respiratory variability, suggesting right atrial pressure of 3 mmHg.   6. Evidence of atrial level shunting detected by color flow Doppler.  Agitated saline  contrast bubble study was positive with shunting observed  within 3-6 cardiac cycles suggestive of interatrial shunt. There is a  moderately sized patent foramen ovale  with predominantly right to left shunting across the atrial septum. The  atrial septum is aneurysmal with significant excursion.    Reproductive/Obstetrics                              Anesthesia Physical Anesthesia Plan  ASA: 3  Anesthesia Plan: Spinal   Post-op Pain Management: Ofirmev  IV (intra-op)*   Induction:   PONV Risk Score and Plan: 2 and Treatment may vary due to age or medical condition, Propofol  infusion, Ondansetron  and Dexamethasone   Airway Management Planned: Natural Airway  Additional Equipment:   Intra-op Plan:   Post-operative Plan:   Informed Consent: I have reviewed the patients History and Physical, chart, labs and discussed the procedure including the risks, benefits and alternatives for the proposed anesthesia with the patient or authorized representative who has indicated his/her understanding and acceptance.     Dental advisory given  Plan Discussed with: CRNA  Anesthesia Plan Comments: (See PAT note 01/30/2024)        Anesthesia Quick Evaluation

## 2024-02-02 NOTE — Progress Notes (Signed)
 Anesthesia Chart Review   Case: [id number] Date/Time: 02/07/24 1225   Procedure: ARTHROPLASTY, HIP, TOTAL, ANTERIOR APPROACH (Left: Hip)   Anesthesia type: Choice   Pre-op diagnosis: Left hip osteoarthritis   Location: [facility] ROOM 09 / [facility]   Surgeons: Melodi Lerner, MD       DISCUSSION:82 y.o. never smoker with h/o HTN, hyperparathyroidism, DM II, CKD, RA, left hip OA scheduled for above procedure 02/07/2024 with Dr. Lerner Melodi.   Pt cleared by PCP, clearance under media tab, which states pt is cleared and is low risk for procedure.   She was evaluated in the past by cardiology for PFO in 2021.  Per OV note, I reviewed the patient's echocardiogram and this demonstrates normal LV and RV function, no significant valvular disease, and presence of an atrial septal aneurysm with positive bubble study typical of PFO with associated atrial septal aneurysm.  In reviewing notes from neurology with extensive documentation regarding MRI findings, it appears the patient has not had any evidence of an embolic stroke.  She does have small vessel disease and evidence of lacunar infarcts which appear to be chronic.  Her EKG shows sinus rhythm with no conduction disease.  Considering her advanced age, I think she is unlikely to benefit from transcatheter PFO closure especially in the absence of any history of embolic stroke.  I have recommended that she start aspirin  81 mg daily once she recovers from hip surgery and is off of oral anticoagulation for DVT prophylaxis.  She should have aggressive preventive measures for DVT prophylaxis with hip surgery in the setting of her PFO.  It is notable that she has had multiple surgeries in the past with no complications.  At this time she was cleared for a total hip and advised to follow up with cardiology prn. No anesthesia complications noted with total hip in 2021.   Pt reports she is able to climb a flight of stairs without chest pain or shortness of breath. EKG  with no acute findings.  VS: BP (!) 152/86 Comment: right arm sitting  Pulse 86   Temp 36.7 C (Oral)   Resp 16   Ht 5' 5 (1.651 m)   Wt 73.5 kg   SpO2 95%   BMI 26.96 kg/m   PROVIDERS: Toribio Jerel MATSU, MD is PCP    LABS: Labs reviewed: Acceptable for surgery. (all labs ordered are listed, but only abnormal results are displayed)  Labs Reviewed  COMPREHENSIVE METABOLIC PANEL WITH GFR - Abnormal; Notable for the following components:      Result Value   Sodium 131 (*)    Chloride 92 (*)    Glucose, Bld 110 (*)    All other components within normal limits  HEMOGLOBIN A1C - Abnormal; Notable for the following components:   Hgb A1c MFr Bld 6.0 (*)    All other components within normal limits  GLUCOSE, CAPILLARY - Abnormal; Notable for the following components:   Glucose-Capillary 115 (*)    All other components within normal limits  SURGICAL PCR SCREEN  CBC  TYPE AND SCREEN     IMAGES:   EKG:   CV: Echo 12/18/2019 1. Left ventricular ejection fraction, by estimation, is 60 to 65%. The  left ventricle has normal function. The left ventricle has no regional  wall motion abnormalities. There is moderate left ventricular hypertrophy.  Left ventricular diastolic  parameters are consistent with Grade I diastolic dysfunction (impaired  relaxation).   2. Right ventricular systolic function is normal.  The right ventricular  size is normal. There is normal pulmonary artery systolic pressure.   3. The mitral valve is grossly normal. Trivial mitral valve  regurgitation.   4. The aortic valve is tricuspid. Aortic valve regurgitation is not  visualized.   5. The inferior vena cava is normal in size with greater than 50%  respiratory variability, suggesting right atrial pressure of 3 mmHg.   6. Evidence of atrial level shunting detected by color flow Doppler.  Agitated saline contrast bubble study was positive with shunting observed  within 3-6 cardiac cycles suggestive of  interatrial shunt. There is a  moderately sized patent foramen ovale  with predominantly right to left shunting across the atrial septum. The  atrial septum is aneurysmal with significant excursion.   Past Medical History:  Diagnosis Date   Anxiety    Arthritis    RA   CKD (chronic kidney disease)    pt. denies   Complication of anesthesia     my heart rate gets low   Depression    Depression    Diabetes mellitus (HCC)    type 2   GERD (gastroesophageal reflux disease)    Hepatic steatosis    Hyperlipidemia    Hyperparathyroidism, primary (HCC)    Hypertension    IBS (irritable bowel syndrome)    Internal hemorrhoid    OA (osteoarthritis)    Syncope    Tubular adenoma of colon 1999   colonoscopy   Wears dentures    Wears glasses     Past Surgical History:  Procedure Laterality Date   ANKLE FRACTURE SURGERY Left yrs ago   BACK SURGERY  04/2011   lower fpr spinal stenosis and ruptured disc   COLONOSCOPY W/ BIOPSIES AND POLYPECTOMY     LAMINECTOMY  2009   Dr. Heide   LUMBAR DISC SURGERY  04/13/2011   L3, L4 Dr. Runell   PARATHYROIDECTOMY Right 04/27/2017   Procedure: RIGHT PARATHYROIDECTOMY;  Surgeon: Eletha Boas, MD;  Location: [facility];  Service: General;  Laterality: Right;   TONSILLECTOMY  as child   and adenoids   TOTAL HIP ARTHROPLASTY Right 01/22/2020   Procedure: TOTAL HIP ARTHROPLASTY ANTERIOR APPROACH;  Surgeon: Melodi Lerner, MD;  Location: [facility];  Service: Orthopedics;  Laterality: Right;    TOTAL KNEE ARTHROPLASTY Left 05/07/2018   Procedure: LEFT TOTAL KNEE ARTHROPLASTY;  Surgeon: Melodi Lerner, MD;  Location: [facility];  Service: Orthopedics;  Laterality: Left;  50 mins   VAGINAL HYSTERECTOMY  1964   partial   vein stripping R leg      MEDICATIONS:  citalopram  (CELEXA ) 20 MG tablet   folic acid  (FOLVITE ) 1 MG tablet   glucose blood (ONETOUCH VERIO) test strip   hydrocortisone  2.5 % cream   Liniments (SALONPAS PAIN RELIEF PATCH EX)    losartan -hydrochlorothiazide  (HYZAAR) 100-12.5 MG per tablet   metFORMIN (GLUCOPHAGE-XR) 500 MG 24 hr tablet   methotrexate 2.5 MG tablet   mupirocin ointment (BACTROBAN) 2 %   omeprazole (PRILOSEC) 20 MG capsule   traMADol  (ULTRAM ) 50 MG tablet   traZODone  (DESYREL ) 50 MG tablet   No current facility-administered medications for this encounter.    sodium chloride  flush (NS) 0.9 % injection 16 mL    Deavion Strider Ward, PA-C [facility] [phone]

## 2024-02-07 ENCOUNTER — Observation Stay (HOSPITAL_COMMUNITY)
Admission: RE | Admit: 2024-02-07 | Discharge: 2024-02-08 | Disposition: A | Discharge status: Home / Self Care | Attending: Orthopedic Surgery | Admitting: Orthopedic Surgery

## 2024-02-07 ENCOUNTER — Observation Stay (HOSPITAL_COMMUNITY)

## 2024-02-07 ENCOUNTER — Other Ambulatory Visit: Payer: Self-pay

## 2024-02-07 ENCOUNTER — Encounter (HOSPITAL_COMMUNITY): Payer: Self-pay | Admitting: Orthopedic Surgery

## 2024-02-07 ENCOUNTER — Ambulatory Visit (HOSPITAL_COMMUNITY)

## 2024-02-07 ENCOUNTER — Ambulatory Visit (HOSPITAL_COMMUNITY): Admitting: Anesthesiology

## 2024-02-07 ENCOUNTER — Ambulatory Visit (HOSPITAL_COMMUNITY): Payer: Self-pay | Admitting: Physician Assistant

## 2024-02-07 ENCOUNTER — Encounter (HOSPITAL_COMMUNITY)
Admission: RE | Disposition: A | Discharge status: Home / Self Care | Payer: Self-pay | Source: Home / Self Care | Attending: Orthopedic Surgery

## 2024-02-07 DIAGNOSIS — I10 Essential (primary) hypertension: Secondary | ICD-10-CM | POA: Diagnosis not present

## 2024-02-07 DIAGNOSIS — I129 Hypertensive chronic kidney disease with stage 1 through stage 4 chronic kidney disease, or unspecified chronic kidney disease: Secondary | ICD-10-CM | POA: Diagnosis not present

## 2024-02-07 DIAGNOSIS — M1612 Unilateral primary osteoarthritis, left hip: Secondary | ICD-10-CM | POA: Diagnosis not present

## 2024-02-07 DIAGNOSIS — N189 Chronic kidney disease, unspecified: Secondary | ICD-10-CM

## 2024-02-07 DIAGNOSIS — E119 Type 2 diabetes mellitus without complications: Secondary | ICD-10-CM | POA: Diagnosis not present

## 2024-02-07 DIAGNOSIS — F418 Other specified anxiety disorders: Secondary | ICD-10-CM | POA: Diagnosis not present

## 2024-02-07 DIAGNOSIS — Z7982 Long term (current) use of aspirin: Secondary | ICD-10-CM | POA: Insufficient documentation

## 2024-02-07 DIAGNOSIS — Z96641 Presence of right artificial hip joint: Secondary | ICD-10-CM | POA: Diagnosis not present

## 2024-02-07 DIAGNOSIS — M169 Osteoarthritis of hip, unspecified: Principal | ICD-10-CM | POA: Diagnosis present

## 2024-02-07 HISTORY — PX: TOTAL HIP ARTHROPLASTY: SHX124

## 2024-02-07 LAB — GLUCOSE, CAPILLARY: Glucose-Capillary: 117 mg/dL — ABNORMAL HIGH (ref 70–99)

## 2024-02-07 LAB — TYPE AND SCREEN

## 2024-02-07 SURGERY — ARTHROPLASTY, HIP, TOTAL, ANTERIOR APPROACH
Anesthesia: Spinal | Site: Hip | Laterality: Left

## 2024-02-07 MED ORDER — CHLORHEXIDINE GLUCONATE 0.12 % MT SOLN: 15.0000 mL | Freq: Once | OROMUCOSAL | Status: DC

## 2024-02-07 MED ORDER — LOSARTAN POTASSIUM-HCTZ 100-12.5 MG PO TABS: 1.0000 | ORAL_TABLET | Freq: Every day | ORAL | Status: DC

## 2024-02-07 MED ORDER — ACETAMINOPHEN 325 MG PO TABS: 325.0000 mg | ORAL_TABLET | Freq: Four times a day (QID) | ORAL | Status: DC | PRN

## 2024-02-07 MED ORDER — TRANEXAMIC ACID-NACL 1000-0.7 MG/100ML-% IV SOLN
1000.0000 mg | INTRAVENOUS | Status: AC
  Administered 2024-02-07: 1000 mg via INTRAVENOUS
  Filled 2024-02-07: qty 100

## 2024-02-07 MED ORDER — HYDROCHLOROTHIAZIDE 12.5 MG PO TABS
12.5000 mg | ORAL_TABLET | Freq: Every day | ORAL | Status: DC
  Administered 2024-02-08: 12.5 mg via ORAL
  Filled 2024-02-07: qty 1

## 2024-02-07 MED ORDER — METHOCARBAMOL 1000 MG/10ML IJ SOLN: 500.0000 mg | Freq: Four times a day (QID) | INTRAMUSCULAR | Status: DC | PRN

## 2024-02-07 MED ORDER — POLYETHYLENE GLYCOL 3350 17 G PO PACK: 17.0000 g | PACK | Freq: Every day | ORAL | Status: DC | PRN

## 2024-02-07 MED ORDER — PROPOFOL 500 MG/50ML IV EMUL
INTRAVENOUS | Status: DC | PRN
  Administered 2024-02-07: 85 ug/kg/min via INTRAVENOUS

## 2024-02-07 MED ORDER — TRAMADOL HCL 50 MG PO TABS
ORAL_TABLET | ORAL | Status: AC
  Filled 2024-02-07: qty 1

## 2024-02-07 MED ORDER — POVIDONE-IODINE 10 % EX SWAB
2.0000 | Freq: Once | CUTANEOUS | Status: AC
  Administered 2024-02-07: 2 via TOPICAL

## 2024-02-07 MED ORDER — PANTOPRAZOLE SODIUM 40 MG PO TBEC
40.0000 mg | DELAYED_RELEASE_TABLET | Freq: Every day | ORAL | Status: DC
  Administered 2024-02-08: 40 mg via ORAL
  Filled 2024-02-07: qty 1

## 2024-02-07 MED ORDER — DOCUSATE SODIUM 100 MG PO CAPS
100.0000 mg | ORAL_CAPSULE | Freq: Two times a day (BID) | ORAL | Status: DC
Start: 2024-02-07 — End: 2024-02-08
  Administered 2024-02-07 – 2024-02-08 (×2): 100 mg via ORAL
  Filled 2024-02-07 (×2): qty 1

## 2024-02-07 MED ORDER — MAGNESIUM CITRATE PO SOLN: 1.0000 | Freq: Once | ORAL | Status: DC | PRN

## 2024-02-07 MED ORDER — BUPIVACAINE-EPINEPHRINE (PF) 0.25% -1:200000 IJ SOLN
INTRAMUSCULAR | Status: DC | PRN
  Administered 2024-02-07: 30 mL

## 2024-02-07 MED ORDER — OXYCODONE HCL 5 MG/5ML PO SOLN: 5.0000 mg | Freq: Once | ORAL | Status: DC | PRN

## 2024-02-07 MED ORDER — HYDROCODONE-ACETAMINOPHEN 5-325 MG PO TABS
1.0000 | ORAL_TABLET | ORAL | Status: DC | PRN
  Administered 2024-02-08: 2 via ORAL
  Filled 2024-02-07 (×2): qty 2

## 2024-02-07 MED ORDER — ONDANSETRON HCL 4 MG/2ML IJ SOLN: 4.0000 mg | Freq: Four times a day (QID) | INTRAMUSCULAR | Status: DC | PRN

## 2024-02-07 MED ORDER — ACETAMINOPHEN 10 MG/ML IV SOLN
1000.0000 mg | Freq: Once | INTRAVENOUS | Status: AC
  Administered 2024-02-07: 1000 mg via INTRAVENOUS
  Filled 2024-02-07: qty 100

## 2024-02-07 MED ORDER — METOCLOPRAMIDE HCL 5 MG PO TABS: 5.0000 mg | ORAL_TABLET | Freq: Three times a day (TID) | ORAL | Status: DC | PRN

## 2024-02-07 MED ORDER — METHOCARBAMOL 500 MG PO TABS
500.0000 mg | ORAL_TABLET | Freq: Four times a day (QID) | ORAL | Status: DC | PRN
  Administered 2024-02-07 – 2024-02-08 (×4): 500 mg via ORAL
  Filled 2024-02-07 (×3): qty 1

## 2024-02-07 MED ORDER — ONDANSETRON HCL 4 MG PO TABS: 4.0000 mg | ORAL_TABLET | Freq: Four times a day (QID) | ORAL | Status: DC | PRN

## 2024-02-07 MED ORDER — BISACODYL 10 MG RE SUPP: 10.0000 mg | Freq: Every day | RECTAL | Status: DC | PRN

## 2024-02-07 MED ORDER — BUPIVACAINE-EPINEPHRINE (PF) 0.25% -1:200000 IJ SOLN
INTRAMUSCULAR | Status: AC
  Filled 2024-02-07: qty 30

## 2024-02-07 MED ORDER — PROPOFOL 10 MG/ML IV BOLUS
INTRAVENOUS | Status: DC | PRN
  Administered 2024-02-07: 40 mg via INTRAVENOUS

## 2024-02-07 MED ORDER — PHENOL 1.4 % MT LIQD: 1.0000 | OROMUCOSAL | Status: DC | PRN

## 2024-02-07 MED ORDER — TRAMADOL HCL 50 MG PO TABS
50.0000 mg | ORAL_TABLET | Freq: Four times a day (QID) | ORAL | Status: DC | PRN
  Administered 2024-02-07 – 2024-02-08 (×4): 50 mg via ORAL
  Filled 2024-02-07 (×3): qty 1

## 2024-02-07 MED ORDER — PROPOFOL 1000 MG/100ML IV EMUL
INTRAVENOUS | Status: AC
  Filled 2024-02-07: qty 100

## 2024-02-07 MED ORDER — 0.9 % SODIUM CHLORIDE (POUR BTL) OPTIME
TOPICAL | Status: DC | PRN
  Administered 2024-02-07: 1000 mL

## 2024-02-07 MED ORDER — METHOCARBAMOL 500 MG PO TABS
ORAL_TABLET | ORAL | Status: AC
  Filled 2024-02-07: qty 1

## 2024-02-07 MED ORDER — LOSARTAN POTASSIUM 50 MG PO TABS
100.0000 mg | ORAL_TABLET | Freq: Every day | ORAL | Status: DC
  Administered 2024-02-08: 100 mg via ORAL
  Filled 2024-02-07: qty 2

## 2024-02-07 MED ORDER — PHENYLEPHRINE HCL-NACL 20-0.9 MG/250ML-% IV SOLN
INTRAVENOUS | Status: DC | PRN
  Administered 2024-02-07: 40 ug/min via INTRAVENOUS

## 2024-02-07 MED ORDER — DEXAMETHASONE SODIUM PHOSPHATE 10 MG/ML IJ SOLN: 8.0000 mg | Freq: Once | INTRAMUSCULAR | Status: DC

## 2024-02-07 MED ORDER — METOCLOPRAMIDE HCL 5 MG/ML IJ SOLN: 5.0000 mg | Freq: Three times a day (TID) | INTRAMUSCULAR | Status: DC | PRN

## 2024-02-07 MED ORDER — LACTATED RINGERS IV SOLN
INTRAVENOUS | Status: DC
Start: 2024-02-07 — End: 2024-02-07

## 2024-02-07 MED ORDER — OXYCODONE HCL 5 MG PO TABS: 5.0000 mg | ORAL_TABLET | Freq: Once | ORAL | Status: DC | PRN

## 2024-02-07 MED ORDER — ONDANSETRON HCL 4 MG/2ML IJ SOLN
INTRAMUSCULAR | Status: DC | PRN
Start: 2024-02-07 — End: 2024-02-07
  Administered 2024-02-07: 4 mg via INTRAVENOUS

## 2024-02-07 MED ORDER — FENTANYL CITRATE PF 50 MCG/ML IJ SOSY: 25.0000 ug | PREFILLED_SYRINGE | INTRAMUSCULAR | Status: DC | PRN

## 2024-02-07 MED ORDER — ORAL CARE MOUTH RINSE: 15.0000 mL | OROMUCOSAL | Status: DC | PRN

## 2024-02-07 MED ORDER — MENTHOL 3 MG MT LOZG: 1.0000 | LOZENGE | OROMUCOSAL | Status: DC | PRN

## 2024-02-07 MED ORDER — ACETAMINOPHEN 10 MG/ML IV SOLN: 1000.0000 mg | Freq: Four times a day (QID) | INTRAVENOUS | Status: DC

## 2024-02-07 MED ORDER — INSULIN ASPART 100 UNIT/ML IJ SOLN: 0.0000 [IU] | INTRAMUSCULAR | Status: DC | PRN

## 2024-02-07 MED ORDER — DEXAMETHASONE SODIUM PHOSPHATE 10 MG/ML IJ SOLN
10.0000 mg | Freq: Once | INTRAMUSCULAR | Status: AC
  Administered 2024-02-08: 10 mg via INTRAVENOUS
  Filled 2024-02-07: qty 1

## 2024-02-07 MED ORDER — ORAL CARE MOUTH RINSE: 15.0000 mL | Freq: Once | OROMUCOSAL | Status: DC

## 2024-02-07 MED ORDER — DEXAMETHASONE SODIUM PHOSPHATE 10 MG/ML IJ SOLN
INTRAMUSCULAR | Status: DC | PRN
  Administered 2024-02-07: 10 mg via INTRAVENOUS

## 2024-02-07 MED ORDER — TRAZODONE HCL 50 MG PO TABS
25.0000 mg | ORAL_TABLET | Freq: Every day | ORAL | Status: DC
  Administered 2024-02-07: 25 mg via ORAL
  Filled 2024-02-07: qty 1

## 2024-02-07 MED ORDER — CEFAZOLIN SODIUM-DEXTROSE 2-4 GM/100ML-% IV SOLN
2.0000 g | Freq: Four times a day (QID) | INTRAVENOUS | Status: AC
  Administered 2024-02-07 (×2): 2 g via INTRAVENOUS
  Filled 2024-02-07 (×2): qty 100

## 2024-02-07 MED ORDER — CEFAZOLIN SODIUM-DEXTROSE 2-4 GM/100ML-% IV SOLN
2.0000 g | INTRAVENOUS | Status: AC
  Administered 2024-02-07: 2 g via INTRAVENOUS
  Filled 2024-02-07: qty 100

## 2024-02-07 MED ORDER — MORPHINE SULFATE (PF) 2 MG/ML IV SOLN
0.5000 mg | INTRAVENOUS | Status: DC | PRN
  Administered 2024-02-07 – 2024-02-08 (×3): 1 mg via INTRAVENOUS
  Filled 2024-02-07 (×4): qty 1

## 2024-02-07 MED ORDER — AMISULPRIDE (ANTIEMETIC) 5 MG/2ML IV SOLN: 10.0000 mg | Freq: Once | INTRAVENOUS | Status: DC | PRN

## 2024-02-07 MED ORDER — LACTATED RINGERS IV SOLN: INTRAVENOUS | Status: DC

## 2024-02-07 MED ORDER — CITALOPRAM HYDROBROMIDE 20 MG PO TABS
20.0000 mg | ORAL_TABLET | Freq: Every day | ORAL | Status: DC
  Administered 2024-02-08: 20 mg via ORAL
  Filled 2024-02-07: qty 1

## 2024-02-07 MED ORDER — SODIUM CHLORIDE 0.9 % IV SOLN
INTRAVENOUS | Status: DC
Start: 2024-02-07 — End: 2024-02-08

## 2024-02-07 MED ORDER — ASPIRIN 81 MG PO CHEW
81.0000 mg | CHEWABLE_TABLET | Freq: Two times a day (BID) | ORAL | Status: DC
  Administered 2024-02-08: 81 mg via ORAL
  Filled 2024-02-07: qty 1

## 2024-02-07 SURGICAL SUPPLY — 36 items
BAG COUNTER SPONGE SURGICOUNT (BAG) IMPLANT
BAG ZIPLOCK 12X15 (MISCELLANEOUS) IMPLANT
BLADE SAG 18X100X1.27 (BLADE) ×2 IMPLANT
COVER PERINEAL POST (MISCELLANEOUS) ×2 IMPLANT
COVER SURGICAL LIGHT HANDLE (MISCELLANEOUS) ×2 IMPLANT
CUP ACET PINNACLE SECTR 50MM (Hips) IMPLANT
DERMABOND ADVANCED .7 DNX12 (GAUZE/BANDAGES/DRESSINGS) ×2 IMPLANT
DRAPE FOOT SWITCH (DRAPES) ×2 IMPLANT
DRAPE STERI IOBAN 125X83 (DRAPES) ×2 IMPLANT
DRAPE U-SHAPE 47X51 STRL (DRAPES) ×4 IMPLANT
DRSG AQUACEL AG ADV 3.5X10 (GAUZE/BANDAGES/DRESSINGS) ×2 IMPLANT
DURAPREP 26ML APPLICATOR (WOUND CARE) ×2 IMPLANT
ELECT REM PT RETURN 15FT ADLT (MISCELLANEOUS) ×2 IMPLANT
GLOVE BIO SURGEON STRL SZ 6.5 (GLOVE) IMPLANT
GLOVE BIO SURGEON STRL SZ7 (GLOVE) IMPLANT
GLOVE BIO SURGEON STRL SZ8 (GLOVE) ×2 IMPLANT
GLOVE BIOGEL PI IND STRL 7.0 (GLOVE) IMPLANT
GLOVE BIOGEL PI IND STRL 8 (GLOVE) ×2 IMPLANT
GOWN STRL REUS W/ TWL LRG LVL3 (GOWN DISPOSABLE) ×4 IMPLANT
HEAD FEM STD 32X+5 STRL (Hips) IMPLANT
HOLDER FOLEY CATH W/STRAP (MISCELLANEOUS) ×2 IMPLANT
KIT TURNOVER KIT A (KITS) ×2 IMPLANT
LINER ACET PNNCL PLUS4 NEUTRAL (Hips) IMPLANT
MANIFOLD NEPTUNE II (INSTRUMENTS) ×2 IMPLANT
PACK ANTERIOR HIP CUSTOM (KITS) ×2 IMPLANT
PENCIL SMOKE EVACUATOR COATED (MISCELLANEOUS) ×2 IMPLANT
SPIKE FLUID TRANSFER (MISCELLANEOUS) ×2 IMPLANT
STEM FEMORAL SZ5 HIGH ACTIS (Stem) IMPLANT
SUT ETHIBOND NAB CT1 #1 30IN (SUTURE) ×2 IMPLANT
SUT MNCRL AB 4-0 PS2 18 (SUTURE) ×2 IMPLANT
SUT VIC AB 2-0 CT1 TAPERPNT 27 (SUTURE) ×4 IMPLANT
SUTURE STRATFX 0 PDS 27 VIOLET (SUTURE) ×2 IMPLANT
TOWEL GREEN STERILE FF (TOWEL DISPOSABLE) ×2 IMPLANT
TRAY FOLEY MTR SLVR 14FR STAT (SET/KITS/TRAYS/PACK) IMPLANT
TRAY FOLEY MTR SLVR 16FR STAT (SET/KITS/TRAYS/PACK) ×2 IMPLANT
TUBE SUCTION HIGH CAP CLEAR NV (SUCTIONS) ×2 IMPLANT

## 2024-02-07 NOTE — Plan of Care (Signed)
  Problem: Education: Goal: Knowledge of General Education information will improve Description: Including pain rating scale, medication(s)/side effects and non-pharmacologic comfort measures Outcome: Progressing   Problem: Health Behavior/Discharge Planning: Goal: Ability to manage health-related needs will improve Outcome: Progressing   Problem: Clinical Measurements: Goal: Ability to maintain clinical measurements within normal limits will improve Outcome: Progressing Goal: Will remain free from infection Outcome: Progressing Goal: Diagnostic test results will improve Outcome: Progressing Goal: Respiratory complications will improve Outcome: Progressing Goal: Cardiovascular complication will be avoided Outcome: Progressing   Problem: Activity: Goal: Risk for activity intolerance will decrease Outcome: Progressing   Problem: Nutrition: Goal: Adequate nutrition will be maintained Outcome: Completed/Met   Problem: Coping: Goal: Level of anxiety will decrease Outcome: Progressing   Problem: Elimination: Goal: Will not experience complications related to bowel motility Outcome: Progressing Goal: Will not experience complications related to urinary retention Outcome: Progressing   Problem: Pain Managment: Goal: General experience of comfort will improve and/or be controlled Outcome: Progressing   Problem: Safety: Goal: Ability to remain free from injury will improve Outcome: Progressing   Problem: Skin Integrity: Goal: Risk for impaired skin integrity will decrease Outcome: Progressing   Problem: Education: Goal: Knowledge of the prescribed therapeutic regimen will improve Outcome: Progressing Goal: Understanding of discharge needs will improve Outcome: Progressing Goal: Individualized Educational Video(s) Outcome: Completed/Met   Problem: Activity: Goal: Ability to avoid complications of mobility impairment will improve Outcome: Progressing Goal: Ability to  tolerate increased activity will improve Outcome: Adequate for Discharge   Problem: Clinical Measurements: Goal: Postoperative complications will be avoided or minimized Outcome: Progressing   Problem: Pain Management: Goal: Pain level will decrease with appropriate interventions Outcome: Progressing   Problem: Skin Integrity: Goal: Will show signs of wound healing Outcome: Progressing

## 2024-02-07 NOTE — Care Plan (Signed)
 Ortho Bundle Case Management Note  Patient Details  Name: Carolyn [name] MRN: [id number] Date of Birth: 28-Aug-1940  L THA on 02/07/24  DCP: Home with daughter and daughter in law DME: No needs. Has RW and cane PT: HEP                   DME Arranged:  N/A DME Agency:  NA  HH Arranged:    HH Agency:     Additional Comments: Please contact me with any questions of if this plan should need to change.  Lyle Pepper, [place] [facility] [phone]   02/07/2024, 10:39 AM

## 2024-02-07 NOTE — Op Note (Signed)
 OPERATIVE REPORT- TOTAL HIP ARTHROPLASTY   PREOPERATIVE DIAGNOSIS: Osteoarthritis of the Left hip.   POSTOPERATIVE DIAGNOSIS: Osteoarthritis of the Left  hip.   PROCEDURE: Left total hip arthroplasty, anterior approach.   SURGEON: Dempsey Moan, MD   ASSISTANT: Zelda Kobs, PA-C  ANESTHESIA:  Spinal  ESTIMATED BLOOD LOSS:-450 mL    DRAINS: None  COMPLICATIONS: None   CONDITION: PACU - hemodynamically stable.   BRIEF CLINICAL NOTE: Carolyn [name] is a 83 y.o. female who has advanced end-  stage arthritis of their Left  hip with progressively worsening pain and  dysfunction.The patient has failed nonoperative management and presents for  total hip arthroplasty.   PROCEDURE IN DETAIL: After successful administration of spinal  anesthetic, the traction boots for the [facility] bed were placed on both  feet and the patient was placed onto the [facility] bed, boots placed into the leg  holders. The Left hip was then isolated from the perineum with plastic  drapes and prepped and draped in the usual sterile fashion. ASIS and  greater trochanter were marked and a oblique incision was made, starting  at about 1 cm lateral and 2 cm distal to the ASIS and coursing towards  the anterior cortex of the femur. The skin was cut with a 10 blade  through subcutaneous tissue to the level of the fascia overlying the  tensor fascia lata muscle. The fascia was then incised in line with the  incision at the junction of the anterior third and posterior 2/3rd. The  muscle was teased off the fascia and then the interval between the TFL  and the rectus was developed. The Hohmann retractor was then placed at  the top of the femoral neck over the capsule. The vessels overlying the  capsule were cauterized and the fat on top of the capsule was removed.  A Hohmann retractor was then placed anterior underneath the rectus  femoris to give exposure to the entire anterior capsule. A T-shaped  capsulotomy was  performed. The edges were tagged and the femoral head  was identified.       Osteophytes are removed off the superior acetabulum.  The femoral neck was then cut in situ with an oscillating saw. Traction  was then applied to the left lower extremity utilizing the [facility]  traction. The femoral head was then removed. Retractors were placed  around the acetabulum and then circumferential removal of the labrum was  performed. Osteophytes were also removed. Reaming starts at 47 mm to  medialize and  Increased in 2 mm increments to 49 mm. We reamed in  approximately 40 degrees of abduction, 20 degrees anteversion. A 50 mm  pinnacle acetabular shell was then impacted in anatomic position under  fluoroscopic guidance with excellent purchase. We did not need to place  any additional dome screws. A 32 mm neutral + 4 marathon liner was then  placed into the acetabular shell.       The femoral lift was then placed along the lateral aspect of the femur  just distal to the vastus ridge. The leg was  externally rotated and capsule  was stripped off the inferior aspect of the femoral neck down to the  level of the lesser trochanter, this was done with electrocautery. The femur was lifted after this was performed. The  leg was then placed in an extended and adducted position essentially delivering the femur. We also removed the capsule superiorly and the piriformis from the piriformis fossa to  gain excellent exposure of the  proximal femur. Rongeur was used to remove some cancellous bone to get  into the lateral portion of the proximal femur for placement of the  initial starter reamer. The starter broaches was placed  the starter broach  and was shown to go down the center of the canal. Broaching  with the Actis system was then performed starting at size 0  coursing  Up to size 5. A size 5 had excellent torsional and rotational  and axial stability. The trial high offset neck was then placed  with a 32 + 5  trial head. The hip was then reduced. We confirmed that  the stem was in the canal both on AP and lateral x-rays. It also has excellent sizing. The hip was reduced with outstanding stability through full extension and full external rotation.. AP pelvis was taken and the leg lengths were measured and found to be equal. Hip was then dislocated again and the femoral head and neck removed. The  femoral broach was removed. Size 5 Actis stem with a high offset  neck was then impacted into the femur following native anteversion. Has  excellent purchase in the canal. Excellent torsional and rotational and  axial stability. It is confirmed to be in the canal on AP and lateral  fluoroscopic views. The 32 + 5 metal head was placed and the hip  reduced with outstanding stability. Again AP pelvis was taken and it  confirmed that the leg lengths were equal. The wound was then copiously  irrigated with saline solution and the capsule reattached and repaired  with Ethibond suture. 30 ml of .25% Bupivicaine was  injected into the capsule and into the edge of the tensor fascia lata as well as subcutaneous tissue. The fascia overlying the tensor fascia lata was then closed with a running #1 V-Loc. Subcu was closed with interrupted 2-0 Vicryl and subcuticular running 4-0 Monocryl. Incision was cleaned  and dried. Steri-Strips and a bulky sterile dressing applied. The patient was awakened and transported to  recovery in stable condition.        Please note that a surgical assistant was a medical necessity for this procedure to perform it in a safe and expeditious manner. Assistant was necessary to provide appropriate retraction of vital neurovascular structures and to prevent femoral fracture and allow for anatomic placement of the prosthesis.  Dempsey Moan, M.D.

## 2024-02-07 NOTE — Discharge Instructions (Addendum)
Carolyn Aluisio, MD Total Joint Specialist [facility] [address] [place], [place] [address] [phone]  ANTERIOR APPROACH TOTAL HIP REPLACEMENT POSTOPERATIVE DIRECTIONS     Hip Rehabilitation, Guidelines Following Surgery  The results of a hip operation are greatly improved after range of motion and muscle strengthening exercises. Follow all safety measures which are given to protect your hip. If any of these exercises cause increased pain or swelling in your joint, decrease the amount until you are comfortable again. Then slowly increase the exercises. Call your caregiver if you have problems or questions.   BLOOD CLOT PREVENTION Take an 81 mg Aspirin two times a day for three weeks following surgery. Then take an 81 mg Aspirin once a day for three weeks. Then discontinue Aspirin. You may resume your vitamins/supplements upon discharge from the hospital. Do not take any NSAIDs (Advil, Aleve, Ibuprofen, Meloxicam, etc.) until you are 3 weeks out from surgery  HOME CARE INSTRUCTIONS  Remove items at home which could result in a fall. This includes throw rugs or furniture in walking pathways.  ICE to the affected hip as frequently as 20-30 minutes an hour and then as needed for pain and swelling. Continue to use ice on the hip for pain and swelling from surgery. You may notice swelling that will progress down to the foot and ankle. This is normal after surgery. Elevate the leg when you are not up walking on it.   Continue to use the breathing machine which will help keep your temperature down.  It is common for your temperature to cycle up and down following surgery, especially at night when you are not up moving around and exerting yourself.  The breathing machine keeps your lungs expanded and your temperature down.  DIET You may resume your previous home diet once your are discharged from the hospital.  DRESSING / WOUND CARE / SHOWERING You have an  adhesive waterproof bandage over the incision. Leave this in place until your first follow-up appointment. Once you remove this you will not need to place another bandage.  You may begin showering 3 days following surgery, but do not submerge the incision under water.  ACTIVITY For the first 3-5 days, it is important to rest and keep the operative leg elevated. You should, as a general rule, rest for 50 minutes and walk/stretch for 10 minutes per hour. After 5 days, you may slowly increase activity as tolerated.  Perform the exercises you were provided twice a day for about 15-20 minutes each session. Begin these 2 days following surgery. Walk with your walker as instructed. Use the walker until you are comfortable transitioning to a cane. Walk with the cane in the opposite hand of the operative leg. You may discontinue the cane once you are comfortable and walking steadily. Avoid periods of inactivity such as sitting longer than an hour when not asleep. This helps prevent blood clots.  Do not drive a car for 6 weeks or until released by your surgeon.  Do not drive while taking narcotics.  TED HOSE STOCKINGS Wear the elastic stockings on both legs for three weeks following surgery during the day. You may remove them at night while sleeping.  WEIGHT BEARING Weight bearing as tolerated with assist device (walker, cane, etc) as directed, use it as long as suggested by your surgeon or therapist, typically at least 4-6 weeks.  POSTOPERATIVE CONSTIPATION PROTOCOL Constipation - defined medically as fewer than three stools per week and severe constipation as   less than one stool per week.  One of the most common issues patients have following surgery is constipation.  Even if you have a regular bowel pattern at home, your normal regimen is likely to be disrupted due to multiple reasons following surgery.  Combination of anesthesia, postoperative narcotics, change in appetite and fluid intake all can  affect your bowels.  In order to avoid complications following surgery, here are some recommendations in order to help you during your recovery period.  Colace (docusate) - Pick up an over-the-counter form of Colace or another stool softener and take twice a day as long as you are requiring postoperative pain medications.  Take with a full glass of water daily.  If you experience loose stools or diarrhea, hold the colace until you stool forms back up.  If your symptoms do not get better within 1 week or if they get worse, check with your doctor. Dulcolax (bisacodyl) - Pick up over-the-counter and take as directed by the product packaging as needed to assist with the movement of your bowels.  Take with a full glass of water.  Use this product as needed if not relieved by Colace only.  MiraLax (polyethylene glycol) - Pick up over-the-counter to have on hand.  MiraLax is a solution that will increase the amount of water in your bowels to assist with bowel movements.  Take as directed and can mix with a glass of water, juice, soda, coffee, or tea.  Take if you go more than two days without a movement.Do not use MiraLax more than once per day. Call your doctor if you are still constipated or irregular after using this medication for 7 days in a row.  If you continue to have problems with postoperative constipation, please contact the office for further assistance and recommendations.  If you experience "the worst abdominal pain ever" or develop nausea or vomiting, please contact the office immediatly for further recommendations for treatment.  ITCHING  If you experience itching with your medications, try taking only a single pain pill, or even half a pain pill at a time.  You can also use Benadryl over the counter for itching or also to help with sleep.   MEDICATIONS See your medication summary on the "After Visit Summary" that the nursing staff will review with you prior to discharge.  You may have some home  medications which will be placed on hold until you complete the course of blood thinner medication.  It is important for you to complete the blood thinner medication as prescribed by your surgeon.  Continue your approved medications as instructed at time of discharge.  PRECAUTIONS If you experience chest pain or shortness of breath - call 911 immediately for transfer to the hospital emergency department.  If you develop a fever greater that 101 F, purulent drainage from wound, increased redness or drainage from wound, foul odor from the wound/dressing, or calf pain - CONTACT YOUR SURGEON.                                                   FOLLOW-UP APPOINTMENTS Make sure you keep all of your appointments after your operation with your surgeon and caregivers. You should call the office at the above phone number and make an appointment for approximately two weeks after the date of your surgery or on the   date instructed by your surgeon outlined in the "After Visit Summary".  RANGE OF MOTION AND STRENGTHENING EXERCISES  These exercises are designed to help you keep full movement of your hip joint. Follow your caregiver's or physical therapist's instructions. Perform all exercises about fifteen times, three times per day or as directed. Exercise both hips, even if you have had only one joint replacement. These exercises can be done on a training (exercise) mat, on the floor, on a table or on a bed. Use whatever works the best and is most comfortable for you. Use music or television while you are exercising so that the exercises are a pleasant break in your day. This will make your life better with the exercises acting as a break in routine you can look forward to.  Lying on your back, slowly slide your foot toward your buttocks, raising your knee up off the floor. Then slowly slide your foot back down until your leg is straight again.  Lying on your back spread your legs as far apart as you can without causing  discomfort.  Lying on your side, raise your upper leg and foot straight up from the floor as far as is comfortable. Slowly lower the leg and repeat.  Lying on your back, tighten up the muscle in the front of your thigh (quadriceps muscles). You can do this by keeping your leg straight and trying to raise your heel off the floor. This helps strengthen the largest muscle supporting your knee.  Lying on your back, tighten up the muscles of your buttocks both with the legs straight and with the knee bent at a comfortable angle while keeping your heel on the floor.   POST-OPERATIVE OPIOID TAPER INSTRUCTIONS: It is important to wean off of your opioid medication as soon as possible. If you do not need pain medication after your surgery it is ok to stop day one. Opioids include: Codeine, Hydrocodone(Norco, Vicodin), Oxycodone(Percocet, oxycontin) and hydromorphone amongst others.  Long term and even short term use of opiods can cause: Increased pain response Dependence Constipation Depression Respiratory depression And more.  Withdrawal symptoms can include Flu like symptoms Nausea, vomiting And more Techniques to manage these symptoms Hydrate well Eat regular healthy meals Stay active Use relaxation techniques(deep breathing, meditating, yoga) Do Not substitute Alcohol to help with tapering If you have been on opioids for less than two weeks and do not have pain than it is ok to stop all together.  Plan to wean off of opioids This plan should start within one week post op of your joint replacement. Maintain the same interval or time between taking each dose and first decrease the dose.  Cut the total daily intake of opioids by one tablet each day Next start to increase the time between doses. The last dose that should be eliminated is the evening dose.   IF YOU ARE TRANSFERRED TO A SKILLED REHAB FACILITY If the patient is transferred to a skilled rehab facility following release from the  hospital, a list of the current medications will be sent to the facility for the patient to continue.  When discharged from the skilled rehab facility, please have the facility set up the patient's Home Health Physical Therapy prior to being released. Also, the skilled facility will be responsible for providing the patient with their medications at time of release from the facility to include their pain medication, the muscle relaxants, and their blood thinner medication. If the patient is still at the rehab facility   at time of the two week follow up appointment, the skilled rehab facility will also need to assist the patient in arranging follow up appointment in our office and any transportation needs.  MAKE SURE YOU:  Understand these instructions.  Get help right away if you are not doing well or get worse.    DENTAL ANTIBIOTICS:  In most cases prophylactic antibiotics for Dental procdeures after total joint surgery are not necessary.  Exceptions are as follows:  1. History of prior total joint infection  2. Severely immunocompromised (Organ Transplant, cancer chemotherapy, Rheumatoid biologic meds such as Humera)  3. Poorly controlled diabetes (A1C &gt; 8.0, blood glucose over 200)  If you have one of these conditions, contact your surgeon for an antibiotic prescription, prior to your dental procedure.    Pick up stool softner and laxative for home use following surgery while on pain medications. Do not submerge incision under water. Please use good hand washing techniques while changing dressing each day. May shower starting three days after surgery. Please use a clean towel to pat the incision dry following showers. Continue to use ice for pain and swelling after surgery. Do not use any lotions or creams on the incision until instructed by your surgeon.  

## 2024-02-07 NOTE — Transfer of Care (Signed)
 Immediate Anesthesia Transfer of Care Note  Patient: Carolyn [name]  Procedure(s) Performed: ARTHROPLASTY, HIP, TOTAL, ANTERIOR APPROACH (Left: Hip)  Patient Location: PACU  Anesthesia Type:Spinal  Level of Consciousness: awake, alert , and oriented  Airway & Oxygen Therapy: Patient Spontanous Breathing  Post-op Assessment: Report given to RN and Post -op Vital signs reviewed and stable  Post vital signs: Reviewed and stable  Last Vitals:  Vitals Value Taken Time  BP    Temp    Pulse 73 02/07/24 14:00  Resp    SpO2 96 % 02/07/24 14:00  Vitals shown include unfiled device data.  Last Pain:  Vitals:   02/07/24 1038  TempSrc:   PainSc: 0-No pain         Complications: No notable events documented.

## 2024-02-07 NOTE — Interval H&P Note (Signed)
 History and Physical Interval Note:  02/07/2024 11:27 AM  Carolyn [name]  has presented today for surgery, with the diagnosis of Left hip osteoarthritis.  The various methods of treatment have been discussed with the patient and family. After consideration of risks, benefits and other options for treatment, the patient has consented to  Procedure(s): ARTHROPLASTY, HIP, TOTAL, ANTERIOR APPROACH (Left) as a surgical intervention.  The patient's history has been reviewed, patient examined, no change in status, stable for surgery.  I have reviewed the patient's chart and labs.  Questions were answered to the patient's satisfaction.     Dempsey Blakely Maranan

## 2024-02-07 NOTE — Anesthesia Postprocedure Evaluation (Signed)
 Anesthesia Post Note  Patient: Ronal JINNY Lunger  Procedure(s) Performed: ARTHROPLASTY, HIP, TOTAL, ANTERIOR APPROACH (Left: Hip)     Patient location during evaluation: PACU Anesthesia Type: Spinal Level of consciousness: oriented and awake and alert Pain management: pain level controlled Vital Signs Assessment: post-procedure vital signs reviewed and stable Respiratory status: spontaneous breathing, respiratory function stable and patient connected to nasal cannula oxygen Cardiovascular status: blood pressure returned to baseline and stable Postop Assessment: no headache, no backache and no apparent nausea or vomiting Anesthetic complications: no   No notable events documented.  Last Vitals:  Vitals:   02/07/24 1500 02/07/24 1607  BP: (!) 142/74 (!) 146/79  Pulse: 72 75  Resp: 11 14  Temp: (!) 36.4 C 36.6 C  SpO2: 94% 93%    Last Pain:  Vitals:   02/07/24 1607  TempSrc: Oral  PainSc:                  Marwah Disbro L Leomar Westberg

## 2024-02-07 NOTE — Evaluation (Signed)
 Physical Therapy Evaluation Patient Details Name: Carolyn [name] MRN: [id number] DOB: [birth year] Today's Date: 02/07/2024  History of Present Illness  83 yo female presents to therapy s/p L THA, anterior approach on 02/07/2024 due to failure of conservative measures. Pt PMH includes but is not limited to: OA, syncope, falls, hyperparathyroidism, DM II, HTN, anxiety, CKD, depression, HLD, IBS, lumbar surgery, R THA (2021), and L TKA (2019).  Clinical Impression  Carolyn [name] is a 83 y.o. female POD 0 s/p L THA. Patient reports mod i with mobility at baseline. Patient is now limited by functional impairments (see PT problem list below) and requires CGA for bed mobility and CGA and cues for transfers. Patient was able to ambulate 30 feet with RW and CGA level of assist. Patient instructed in exercise to facilitate ROM and circulation to manage edema.  Patient will benefit from continued skilled PT interventions to address impairments and progress towards PLOF. Acute PT will follow to progress mobility and stair training in preparation for safe discharge home with family support and HEP.       If plan is discharge home, recommend the following: A little help with walking and/or transfers;A little help with bathing/dressing/bathroom;Assistance with cooking/housework;Help with stairs or ramp for entrance;Assist for transportation   Can travel by private vehicle        Equipment Recommendations None recommended by PT  Recommendations for Other Services       Functional Status Assessment Patient has had a recent decline in their functional status and demonstrates the ability to make significant improvements in function in a reasonable and predictable amount of time.     Precautions / Restrictions Precautions Precautions: Fall Restrictions Weight Bearing Restrictions Per Provider Order: No      Mobility  Bed Mobility Overal bed mobility: Needs Assistance Bed Mobility: Supine to Sit      Supine to sit: Contact guard, HOB elevated, Used rails     General bed mobility comments: min cues    Transfers Overall transfer level: Needs assistance Equipment used: Rolling walker (2 wheels) Transfers: Sit to/from Stand Sit to Stand: Contact guard assist           General transfer comment: min cues, pt reports difficulty with eccentric control to sitting surfaces especially commode PLOF    Ambulation/Gait Ambulation/Gait assistance: Contact guard assist Gait Distance (Feet): 30 Feet Assistive device: Rolling walker (2 wheels) Gait Pattern/deviations: Step-to pattern, Antalgic, Decreased stance time - left, Trunk flexed Gait velocity: decreased     General Gait Details: slight trunk flexion with B UE support at RW to offload L LE in stance phase, min cues for safety posture and sequening  Stairs            Wheelchair Mobility     Tilt Bed    Modified Rankin (Stroke Patients Only)       Balance Overall balance assessment: Needs assistance Sitting-balance support: Feet supported Sitting balance-Leahy Scale: Good     Standing balance support: Bilateral upper extremity supported, During functional activity, Reliant on assistive device for balance Standing balance-Leahy Scale: Poor                               Pertinent Vitals/Pain Pain Assessment Pain Assessment: 0-10 Pain Score: 8  Pain Location: L LE and hip Pain Descriptors / Indicators: Aching, Discomfort, Grimacing, Operative site guarding, Heaviness Pain Intervention(s): Limited activity within patient's tolerance, Monitored during session, Repositioned, RN  gave pain meds during session, Ice applied    Home Living Family/patient expects to be discharged to:: Private residence Living Arrangements: Alone Available Help at Discharge: Family Type of Home: House Home Access: Stairs to enter Entrance Stairs-Rails: Right;Left;Can reach both Entrance Stairs-Number of Steps: 4    Home Layout: One level Home Equipment: Agricultural consultant (2 wheels);Cane - single point;Lift chair;Shower seat      Prior Function Prior Level of Function : Needs assist             Mobility Comments: pt mod I for household mobility with RW, pt states limiting community navigation and has family present, pt is mod I for most ADLs and self care tasks ADLs Comments: pt states family assists with IADLs and S for showers     Extremity/Trunk Assessment        Lower Extremity Assessment Lower Extremity Assessment: LLE deficits/detail LLE Deficits / Details: ankle DF 4/5 and PF 3+/5 LLE Sensation: WNL    Cervical / Trunk Assessment Cervical / Trunk Assessment: Back Surgery  Communication   Communication Communication: No apparent difficulties    Cognition Arousal: Alert Behavior During Therapy: WFL for tasks assessed/performed   PT - Cognitive impairments: No apparent impairments                         Following commands: Intact       Cueing       General Comments      Exercises Total Joint Exercises Ankle Circles/Pumps: AROM, Both, 10 reps   Assessment/Plan    PT Assessment Patient needs continued PT services  PT Problem List Decreased strength;Decreased range of motion;Decreased activity tolerance;Decreased balance;Decreased mobility;Decreased coordination;Pain       PT Treatment Interventions DME instruction;Gait training;Stair training;Functional mobility training;Therapeutic activities;Therapeutic exercise;Balance training;Neuromuscular re-education;Patient/family education;Modalities    PT Goals (Current goals can be found in the Care Plan section)  Acute Rehab PT Goals Patient Stated Goal: to be able to sleep in the bed again PT Goal Formulation: With patient Time For Goal Achievement: 02/21/24 Potential to Achieve Goals: Good    Frequency 7X/week     Co-evaluation               AM-PAC PT 6 Clicks Mobility  Outcome Measure  Help needed turning from your back to your side while in a flat bed without using bedrails?: A Little Help needed moving from lying on your back to sitting on the side of a flat bed without using bedrails?: A Little Help needed moving to and from a bed to a chair (including a wheelchair)?: A Little Help needed standing up from a chair using your arms (e.g., wheelchair or bedside chair)?: A Little Help needed to walk in hospital room?: A Little Help needed climbing 3-5 steps with a railing? : Total 6 Click Score: 16    End of Session Equipment Utilized During Treatment: Gait belt Activity Tolerance: Patient tolerated treatment well;No increased pain Patient left: in chair;with call bell/phone within reach Nurse Communication: Mobility status PT Visit Diagnosis: Unsteadiness on feet (R26.81);Other abnormalities of gait and mobility (R26.89);Muscle weakness (generalized) (M62.81);Difficulty in walking, not elsewhere classified (R26.2);Pain Pain - Right/Left: Left Pain - part of body: Hip;Leg    Time: 8165-8142 PT Time Calculation (min) (ACUTE ONLY): 23 min   Charges:   PT Evaluation $PT Eval Low Complexity: 1 Low PT Treatments $Gait Training: 8-22 mins PT General Charges $$ ACUTE PT VISIT: 1 Visit  [place], PT Acute Rehab   [place] VEAR Drone 02/07/2024, 7:07 PM

## 2024-02-08 ENCOUNTER — Encounter (HOSPITAL_COMMUNITY): Payer: Self-pay | Admitting: Orthopedic Surgery

## 2024-02-08 DIAGNOSIS — M1612 Unilateral primary osteoarthritis, left hip: Secondary | ICD-10-CM | POA: Diagnosis not present

## 2024-02-08 LAB — BASIC METABOLIC PANEL WITH GFR
Anion gap: 8 (ref 5–15)
BUN: 14 mg/dL (ref 8–23)
CO2: 28 mmol/L (ref 22–32)
Calcium: 9.4 mg/dL (ref 8.9–10.3)
Chloride: 95 mmol/L — ABNORMAL LOW (ref 98–111)
Creatinine, Ser: 1.07 mg/dL — ABNORMAL HIGH (ref 0.44–1.00)
GFR, Estimated: 52 mL/min — ABNORMAL LOW (ref >60)
Glucose, Bld: 184 mg/dL — ABNORMAL HIGH (ref 70–99)
Potassium: 4 mmol/L (ref 3.5–5.1)
Sodium: 131 mmol/L — ABNORMAL LOW (ref 135–145)

## 2024-02-08 LAB — CBC
HCT: 33.1 % — ABNORMAL LOW (ref 36.0–46.0)
Hemoglobin: 11 g/dL — ABNORMAL LOW (ref 12.0–15.0)
MCH: 31.9 pg (ref 26.0–34.0)
MCHC: 33.2 g/dL (ref 30.0–36.0)
MCV: 95.9 fL (ref 80.0–100.0)
Platelets: 254 10*3/uL (ref 150–400)
RBC: 3.45 MIL/uL — ABNORMAL LOW (ref 3.87–5.11)
RDW: 12.5 % (ref 11.5–15.5)
WBC: 9.6 10*3/uL (ref 4.0–10.5)
nRBC: 0 % (ref 0.0–0.2)

## 2024-02-08 MED ORDER — TRAMADOL HCL 50 MG PO TABS: 50.0000 mg | ORAL_TABLET | Freq: Four times a day (QID) | ORAL | 0 refills | Status: AC | PRN

## 2024-02-08 MED ORDER — ONDANSETRON HCL 4 MG PO TABS: 4.0000 mg | ORAL_TABLET | Freq: Four times a day (QID) | ORAL | 0 refills | Status: AC | PRN

## 2024-02-08 MED ORDER — ASPIRIN 81 MG PO CHEW: 81.0000 mg | CHEWABLE_TABLET | Freq: Two times a day (BID) | ORAL | 0 refills | Status: AC

## 2024-02-08 MED ORDER — HYDROCODONE-ACETAMINOPHEN 5-325 MG PO TABS: 1.0000 | ORAL_TABLET | Freq: Four times a day (QID) | ORAL | 0 refills | Status: AC | PRN

## 2024-02-08 MED ORDER — METHOCARBAMOL 500 MG PO TABS: 500.0000 mg | ORAL_TABLET | Freq: Four times a day (QID) | ORAL | 0 refills | Status: AC | PRN

## 2024-02-08 NOTE — Plan of Care (Signed)

## 2024-02-08 NOTE — Plan of Care (Signed)
  Problem: Education: Goal: Knowledge of General Education information will improve Description: Including pain rating scale, medication(s)/side effects and non-pharmacologic comfort measures 02/08/2024 0802 by Alaina Dozier PARAS, RN Outcome: Adequate for Discharge 02/08/2024 0802 by Alaina Dozier PARAS, RN Outcome: Progressing   Problem: Health Behavior/Discharge Planning: Goal: Ability to manage health-related needs will improve 02/08/2024 0802 by Alaina Dozier PARAS, RN Outcome: Adequate for Discharge 02/08/2024 0802 by Alaina Dozier PARAS, RN Outcome: Progressing   Problem: Clinical Measurements: Goal: Ability to maintain clinical measurements within normal limits will improve 02/08/2024 0802 by Alaina Dozier PARAS, RN Outcome: Adequate for Discharge 02/08/2024 0802 by Alaina Dozier PARAS, RN Outcome: Progressing Goal: Will remain free from infection 02/08/2024 0802 by Alaina Dozier PARAS, RN Outcome: Adequate for Discharge 02/08/2024 0802 by Alaina Dozier PARAS, RN Outcome: Progressing Goal: Diagnostic test results will improve 02/08/2024 0802 by Alaina Dozier PARAS, RN Outcome: Adequate for Discharge 02/08/2024 0802 by Alaina Dozier PARAS, RN Outcome: Progressing Goal: Respiratory complications will improve Outcome: Adequate for Discharge Goal: Cardiovascular complication will be avoided Outcome: Adequate for Discharge   Problem: Activity: Goal: Risk for activity intolerance will decrease Outcome: Adequate for Discharge   Problem: Coping: Goal: Level of anxiety will decrease Outcome: Adequate for Discharge   Problem: Elimination: Goal: Will not experience complications related to bowel motility Outcome: Adequate for Discharge Goal: Will not experience complications related to urinary retention Outcome: Adequate for Discharge   Problem: Pain Managment: Goal: General experience of comfort will improve and/or be controlled Outcome: Adequate for Discharge    Problem: Safety: Goal: Ability to remain free from injury will improve Outcome: Adequate for Discharge   Problem: Skin Integrity: Goal: Risk for impaired skin integrity will decrease Outcome: Adequate for Discharge   Problem: Education: Goal: Knowledge of the prescribed therapeutic regimen will improve Outcome: Adequate for Discharge Goal: Understanding of discharge needs will improve Outcome: Adequate for Discharge   Problem: Activity: Goal: Ability to avoid complications of mobility impairment will improve Outcome: Adequate for Discharge Goal: Ability to tolerate increased activity will improve Outcome: Adequate for Discharge   Problem: Clinical Measurements: Goal: Postoperative complications will be avoided or minimized Outcome: Adequate for Discharge   Problem: Pain Management: Goal: Pain level will decrease with appropriate interventions Outcome: Adequate for Discharge   Problem: Skin Integrity: Goal: Will show signs of wound healing Outcome: Adequate for Discharge

## 2024-02-08 NOTE — TOC Transition Note (Signed)
 Transition of Care [facility]) - Discharge Note   Patient Details  Name: Carolyn [name] MRN: [id number] Date of Birth: 1940/12/23  Transition of Care [facility] [place]) CM/SW Contact:  NORMAN ASPEN, LCSW Phone Number: 02/08/2024, 2:20 PM   Clinical Narrative:     Met with pt who confirms she has needed DME in the home.  Plan for HEP.  No further TOC needs.   Final next level of care: Home/Self Care Barriers to Discharge: No Barriers Identified   Patient Goals and CMS Choice Patient states their goals for this hospitalization and ongoing recovery are:: return home          Discharge Placement                       Discharge Plan and Services Additional resources added to the After Visit Summary for                  DME Arranged: N/A DME Agency: NA                  Social Drivers of Health (SDOH) Interventions SDOH Screenings   Food Insecurity: No Food Insecurity (02/07/2024)  Housing: Low Risk  (02/07/2024)  Transportation Needs: No Transportation Needs (02/07/2024)  Utilities: Not At Risk (02/07/2024)  Social Connections: Unknown (02/07/2024)  Recent Concern: Social Connections - Moderately Isolated (02/07/2024)  Tobacco Use: Low Risk  (02/07/2024)     Readmission Risk Interventions     No data to display

## 2024-02-08 NOTE — Progress Notes (Signed)
 Physical Therapy Treatment Patient Details Name: Carolyn [name] MRN: [id number] DOB: 16-Jan-1941 Today's Date: 02/08/2024   History of Present Illness 83 yo female presents to therapy s/p L THA, anterior approach on 02/07/2024 due to failure of conservative measures. Pt PMH includes but is not limited to: OA, syncope, falls, hyperparathyroidism, DM II, HTN, anxiety, CKD, depression, HLD, IBS, lumbar surgery, R THA (2021), and L TKA (2019).    PT Comments  POD # 1 am session Pt AxO x 3 pleasant.  Pt has had 2 prior joint replacements. Daughter present and also assist Pt with prior surgeries.  Pt amb a functional distance in hallway.  Practiced stairs.  Then returned to room to perform some TE's following HEP handout.  Instructed on proper tech, freq as well as use of ICE.   Addressed all mobility questions, discussed appropriate activity, educated on use of ICE.  Pt ready for D/C to home.    If plan is discharge home, recommend the following: A little help with walking and/or transfers;A little help with bathing/dressing/bathroom;Assistance with cooking/housework;Help with stairs or ramp for entrance;Assist for transportation   Can travel by private vehicle        Equipment Recommendations  None recommended by PT    Recommendations for Other Services       Precautions / Restrictions Precautions Precautions: Fall     Mobility  Bed Mobility               General bed mobility comments: OOB in recliner    Transfers Overall transfer level: Needs assistance Equipment used: Rolling walker (2 wheels) Transfers: Sit to/from Stand Sit to Stand: Supervision, Contact guard assist           General transfer comment: 25% VC's on proper hand placement, esp with stand to sit.    Ambulation/Gait Ambulation/Gait assistance: Supervision, Contact guard assist Gait Distance (Feet): 75 Feet Assistive device: Rolling walker (2 wheels) Gait Pattern/deviations: Step-to pattern, Antalgic,  Decreased stance time - left, Trunk flexed Gait velocity: decreased     General Gait Details: tolerated an increased distance. No overt LOB.  Minimal hip pain.  Daughter present and observered.   Stairs Stairs: Yes Stairs assistance: Supervision, Contact guard assist Stair Management: Forwards, Two rails, Step to pattern Number of Stairs: 4 General stair comments: with Daughter hands on using safety belt, Pt safely navigated 4 steps using B rails but did need 50 VC's on proper sequerncing.   Wheelchair Mobility     Tilt Bed    Modified Rankin (Stroke Patients Only)       Balance                                            Communication Communication Communication: No apparent difficulties  Cognition Arousal: Alert Behavior During Therapy: WFL for tasks assessed/performed   PT - Cognitive impairments: No apparent impairments                       PT - Cognition Comments: AxO x 3 pleasant Lady who lives home alone but will have combined Family to stay with her. Following commands: Intact      Cueing Cueing Techniques: Verbal cues  Exercises  Total Hip Replacement TE's following HEP Handout 10 reps ankle pumps 05 reps knee presses 05 reps heel slides 05 reps SAQ's 05 reps ABD Instructed how  to use a belt loop to assist  Followed by ICE     General Comments        Pertinent Vitals/Pain Pain Assessment Pain Assessment: Faces Faces Pain Scale: Hurts a little bit Pain Location: L LE and hip Pain Descriptors / Indicators: Discomfort, Grimacing, Operative site guarding Pain Intervention(s): Monitored during session, Repositioned, Ice applied    Home Living                          Prior Function            PT Goals (current goals can now be found in the care plan section) Progress towards PT goals: Progressing toward goals    Frequency    7X/week      PT Plan      Co-evaluation               AM-PAC PT 6 Clicks Mobility   Outcome Measure  Help needed turning from your back to your side while in a flat bed without using bedrails?: None Help needed moving from lying on your back to sitting on the side of a flat bed without using bedrails?: None Help needed moving to and from a bed to a chair (including a wheelchair)?: None Help needed standing up from a chair using your arms (e.g., wheelchair or bedside chair)?: None Help needed to walk in hospital room?: A Little Help needed climbing 3-5 steps with a railing? : A Little 6 Click Score: 22    End of Session Equipment Utilized During Treatment: Gait belt Activity Tolerance: Patient tolerated treatment well Patient left: in chair;with call bell/phone within reach Nurse Communication: Mobility status PT Visit Diagnosis: Unsteadiness on feet (R26.81);Other abnormalities of gait and mobility (R26.89);Muscle weakness (generalized) (M62.81);Difficulty in walking, not elsewhere classified (R26.2);Pain Pain - Right/Left: Left Pain - part of body: Hip;Leg     Time: 8879-8850 PT Time Calculation (min) (ACUTE ONLY): 29 min  Charges:    $Gait Training: 8-22 mins $Therapeutic Exercise: 8-22 mins PT General Charges $$ ACUTE PT VISIT: 1 Visit                     {Gayla Benn  PTA Acute  Rehabilitation Services Office M-F          [phone]

## 2024-02-08 NOTE — Progress Notes (Signed)
 AVS reviewed w/ patient and her daughter Carolyn [name]- both verbalized an understanding. Pt dressed for d/c to home. Pt verbalized an understanding that ASA would be  2 times a day starting tonight for 21 days - updated in pen on AVS- PIV removed as noted

## 2024-02-08 NOTE — Progress Notes (Signed)
   Subjective: 1 Day Post-Op Procedure(s) (LRB): ARTHROPLASTY, HIP, TOTAL, ANTERIOR APPROACH (Left) Patient reports pain as mild.   Patient seen in rounds by Dr. Melodi. Patient is doing well, reports soreness in the hip. Denies chest pain or SOB. No issues overnight. Foley catheter to be removed this AM. We will continue therapy today, ambulated 30' yesterday.   Objective: Vital signs in last 24 hours: Temp:  [97.5 F (36.4 C)-98.1 F (36.7 C)] 98.1 F (36.7 C) (07/03 0650) Pulse Rate:  [72-98] 95 (07/03 0650) Resp:  [10-17] 16 (07/03 0650) BP: (96-155)/(58-94) 155/77 (07/03 0650) SpO2:  [93 %-97 %] 94 % (07/03 0650) Weight:  [73.5 kg] 73.5 kg (07/02 1038)  Intake/Output from previous day:  Intake/Output Summary (Last 24 hours) at 02/08/2024 0736 Last data filed at 02/08/2024 0600 Gross per 24 hour  Intake 2468.71 ml  Output 1750 ml  Net 718.71 ml     Intake/Output this shift: No intake/output data recorded.  Labs: Recent Labs    02/08/24 0336  HGB 11.0*   Recent Labs    02/08/24 0336  WBC 9.6  RBC 3.45*  HCT 33.1*  PLT 254   Recent Labs    02/08/24 0336  NA 131*  K 4.0  CL 95*  CO2 28  BUN 14  CREATININE 1.07*  GLUCOSE 184*  CALCIUM 9.4   No results for input(s): LABPT, INR in the last 72 hours.  Exam: General - Patient is Alert and Oriented Extremity - Neurologically intact Neurovascular intact Sensation intact distally Dorsiflexion/Plantar flexion intact Dressing - dressing C/D/I Motor Function - intact, moving foot and toes well on exam.   Past Medical History:  Diagnosis Date   Anxiety    Arthritis    RA   CKD (chronic kidney disease)    pt. denies   Complication of anesthesia     my heart rate gets low   Depression    Depression    Diabetes mellitus (HCC)    type 2   GERD (gastroesophageal reflux disease)    Hepatic steatosis    Hyperlipidemia    Hyperparathyroidism, primary (HCC)    Hypertension    IBS (irritable  bowel syndrome)    Internal hemorrhoid    OA (osteoarthritis)    Syncope    Tubular adenoma of colon 1999   colonoscopy   Wears dentures    Wears glasses     Assessment/Plan: 1 Day Post-Op Procedure(s) (LRB): ARTHROPLASTY, HIP, TOTAL, ANTERIOR APPROACH (Left) Principal Problem:   OA (osteoarthritis) of hip Active Problems:   Primary osteoarthritis of left hip  Estimated body mass index is 26.96 kg/m as calculated from the following:   Height as of this encounter: 5' 5 (1.651 m).   Weight as of this encounter: 73.5 kg. Advance diet Up with therapy D/C IV fluids  DVT Prophylaxis - Aspirin  Weight bearing as tolerated. Continue therapy.  Plan is to go Home after hospital stay. Plan for discharge with HEP later today if progresses with therapy and meeting goals. Follow-up in the office in 2 weeks.  The PDMP database was reviewed today prior to any opioid medications being prescribed to this patient.  Roxie Mess, PA-C Orthopedic Surgery [phone] 02/08/2024, 7:36 AM

## 2024-02-20 NOTE — Discharge Summary (Signed)
 Patient ID: Carolyn [name] MRN: [id number] DOB/AGE: [birth year] 83 y.o.  Admit date: 02/07/2024 Discharge date: 02/08/2024  Admission Diagnoses:  Principal Problem:   OA (osteoarthritis) of hip Active Problems:   Primary osteoarthritis of left hip   Discharge Diagnoses:  Same  Past Medical History:  Diagnosis Date   Anxiety    Arthritis    RA   CKD (chronic kidney disease)    pt. denies   Complication of anesthesia     my heart rate gets low   Depression    Depression    Diabetes mellitus (HCC)    type 2   GERD (gastroesophageal reflux disease)    Hepatic steatosis    Hyperlipidemia    Hyperparathyroidism, primary (HCC)    Hypertension    IBS (irritable bowel syndrome)    Internal hemorrhoid    OA (osteoarthritis)    Syncope    Tubular adenoma of colon 1999   colonoscopy   Wears dentures    Wears glasses     Surgeries: Procedure(s): ARTHROPLASTY, HIP, TOTAL, ANTERIOR APPROACH on 02/07/2024   Consultants:   Discharged Condition: Improved  Hospital Course: Carolyn [name] is an 83 y.o. female who was admitted 02/07/2024 for operative treatment ofOA (osteoarthritis) of hip. Patient has severe unremitting pain that affects sleep, daily activities, and work/hobbies. After pre-op clearance the patient was taken to the operating room on 02/07/2024 and underwent  Procedure(s): ARTHROPLASTY, HIP, TOTAL, ANTERIOR APPROACH.    Patient was given perioperative antibiotics:  Anti-infectives (From admission, onward)    Start     Dose/Rate Route Frequency Ordered Stop   02/07/24 1800  ceFAZolin  (ANCEF ) IVPB 2g/100 mL premix        2 g 200 mL/hr over 30 Minutes Intravenous Every 6 hours 02/07/24 1505 02/08/24 0100   02/07/24 1030  ceFAZolin  (ANCEF ) IVPB 2g/100 mL premix        2 g 200 mL/hr over 30 Minutes Intravenous On call to O.R. 02/07/24 1015 02/07/24 1246        Patient was given sequential compression devices, early ambulation, and chemoprophylaxis to prevent  DVT.  Patient benefited maximally from hospital stay and there were no complications.    Recent vital signs: No data found.   Recent laboratory studies: No results for input(s): WBC, HGB, HCT, PLT, NA, K, CL, CO2, BUN, CREATININE, GLUCOSE, INR, CALCIUM in the last 72 hours.  Invalid input(s): PT, 2   Discharge Medications:   Allergies as of 02/08/2024       Reactions   Ace Inhibitors    cough   Cephalexin    Unknown reaction   Prednisone Other (See Comments)   insomnia   Statins Other (See Comments)   Muscle pain/myalgias   Zetia [ezetimibe]    headache   Sulfa Antibiotics Rash        Medication List     TAKE these medications    aspirin  81 MG chewable tablet Chew 1 tablet (81 mg total) by mouth 2 (two) times daily for 21 days. Then take one 81 mg aspirin  once a day for three weeks. Then discontinue aspirin .   citalopram  20 MG tablet Commonly known as: CELEXA  Take 20 mg by mouth daily with breakfast.   folic acid  1 MG tablet Commonly known as: FOLVITE  Take 1 mg by mouth in the morning.   HYDROcodone -acetaminophen  5-325 MG tablet Commonly known as: NORCO/VICODIN Take 1-2 tablets by mouth every 6 (six) hours as needed for severe pain (pain score 7-10).  hydrocortisone  2.5 % cream Apply 1 Application topically 3 (three) times daily as needed (irritation.).   losartan -hydrochlorothiazide  100-12.5 MG tablet Commonly known as: HYZAAR Take 1 tablet by mouth daily with breakfast.   metFORMIN 500 MG 24 hr tablet Commonly known as: GLUCOPHAGE-XR Take 1,000 mg by mouth in the morning.   methocarbamol  500 MG tablet Commonly known as: ROBAXIN  Take 1 tablet (500 mg total) by mouth every 6 (six) hours as needed for muscle spasms.   methotrexate 2.5 MG tablet Commonly known as: RHEUMATREX Take 25 mg by mouth every Monday.   mupirocin ointment 2 % Commonly known as: BACTROBAN Apply 1 Application topically 3 (three) times daily as  needed (skin infection.).   omeprazole 20 MG capsule Commonly known as: PRILOSEC Take 20 mg by mouth daily before breakfast.   ondansetron  4 MG tablet Commonly known as: ZOFRAN  Take 1 tablet (4 mg total) by mouth every 6 (six) hours as needed for nausea.   OneTouch Verio test strip Generic drug: glucose blood OneTouch Verio test strips   SALONPAS PAIN RELIEF PATCH EX Place 1 patch onto the skin as needed (pain).   traMADol  50 MG tablet Commonly known as: ULTRAM  Take 1 tablet (50 mg total) by mouth every 6 (six) hours as needed for moderate pain (pain score 4-6). What changed:  when to take this reasons to take this   traZODone  50 MG tablet Commonly known as: DESYREL  Take 25 mg by mouth at bedtime.               Discharge Care Instructions  (From admission, onward)           Start     Ordered   02/08/24 0000  Weight bearing as tolerated        02/08/24 0738   02/08/24 0000  Change dressing       Comments: You have an adhesive waterproof bandage over the incision. Leave this in place until your first follow-up appointment. Once you remove this you will not need to place another bandage.   02/08/24 0738            Diagnostic Studies: DG Pelvis Portable Result Date: 02/07/2024 CLINICAL DATA:  Status post hip replacement. EXAM: PORTABLE PELVIS 1-2 VIEWS COMPARISON:  None Available. FINDINGS: Left hip arthroplasty in expected alignment. No periprosthetic lucency or fracture. Recent postsurgical change includes air and edema in the soft tissues. Previous right hip arthroplasty. IMPRESSION: Left hip arthroplasty without immediate postoperative complication. Electronically Signed   By: Andrea Gasman M.D.   On: 02/07/2024 14:37   DG HIP UNILAT WITH PELVIS 1V LEFT Result Date: 02/07/2024 CLINICAL DATA:  Elective surgery. EXAM: DG HIP (WITH OR WITHOUT PELVIS) 1V*L* COMPARISON:  None Available. FINDINGS: Two fluoroscopic spot views of the pelvis and left hip obtained  in the operating room. Images during hip arthroplasty. Fluoroscopy time 6 seconds. Dose 0.7 mGy. Previous right hip arthroplasty. IMPRESSION: Intraoperative fluoroscopy during left hip arthroplasty. Electronically Signed   By: Andrea Gasman M.D.   On: 02/07/2024 14:10   DG C-Arm 1-60 Min-No Report Result Date: 02/07/2024 Fluoroscopy was utilized by the requesting physician.  No radiographic interpretation.   DG C-Arm 1-60 Min-No Report Result Date: 02/07/2024 Fluoroscopy was utilized by the requesting physician.  No radiographic interpretation.    Disposition: Discharge disposition: 01-Home or Self Care       Discharge Instructions     Call MD / Call 911   Complete by: As directed    If  you experience chest pain or shortness of breath, CALL 911 and be transported to the hospital emergency room.  If you develope a fever above 101 F, pus (white drainage) or increased drainage or redness at the wound, or calf pain, call your surgeon's office.   Change dressing   Complete by: As directed    You have an adhesive waterproof bandage over the incision. Leave this in place until your first follow-up appointment. Once you remove this you will not need to place another bandage.   Constipation Prevention   Complete by: As directed    Drink plenty of fluids.  Prune juice may be helpful.  You may use a stool softener, such as Colace (over the counter) 100 mg twice a day.  Use MiraLax  (over the counter) for constipation as needed.   Diet - low sodium heart healthy   Complete by: As directed    Do not sit on low chairs, stoools or toilet seats, as it may be difficult to get up from low surfaces   Complete by: As directed    Driving restrictions   Complete by: As directed    No driving for two weeks   Post-operative opioid taper instructions:   Complete by: As directed    POST-OPERATIVE OPIOID TAPER INSTRUCTIONS: It is important to wean off of your opioid medication as soon as possible. If you  do not need pain medication after your surgery it is ok to stop day one. Opioids include: Codeine, Hydrocodone (Norco, Vicodin), Oxycodone (Percocet, oxycontin ) and hydromorphone  amongst others.  Long term and even short term use of opiods can cause: Increased pain response Dependence Constipation Depression Respiratory depression And more.  Withdrawal symptoms can include Flu like symptoms Nausea, vomiting And more Techniques to manage these symptoms Hydrate well Eat regular healthy meals Stay active Use relaxation techniques(deep breathing, meditating, yoga) Do Not substitute Alcohol  to help with tapering If you have been on opioids for less than two weeks and do not have pain than it is ok to stop all together.  Plan to wean off of opioids This plan should start within one week post op of your joint replacement. Maintain the same interval or time between taking each dose and first decrease the dose.  Cut the total daily intake of opioids by one tablet each day Next start to increase the time between doses. The last dose that should be eliminated is the evening dose.      TED hose   Complete by: As directed    Use stockings (TED hose) for three weeks on both leg(s).  You may remove them at night for sleeping.   Weight bearing as tolerated   Complete by: As directed         Follow-up Information     Kristian Stabs, [place]. Go on 02/22/2024.   Specialty: Orthopedic Surgery Why: You are scheduled for a post op appointment on Thursday 02/22/24 at 11:00am Contact information: [address] [place] [address] [phone]                  Signed: Roxie Mess 02/20/2024, 12:33 PM

## 2024-03-14 DIAGNOSIS — Z5189 Encounter for other specified aftercare: Secondary | ICD-10-CM | POA: Diagnosis not present

## 2024-04-15 DIAGNOSIS — M25561 Pain in right knee: Secondary | ICD-10-CM | POA: Diagnosis not present

## 2024-04-17 DIAGNOSIS — L820 Inflamed seborrheic keratosis: Secondary | ICD-10-CM | POA: Diagnosis not present

## 2024-04-22 DIAGNOSIS — E538 Deficiency of other specified B group vitamins: Secondary | ICD-10-CM | POA: Diagnosis not present

## 2024-05-01 DIAGNOSIS — E663 Overweight: Secondary | ICD-10-CM | POA: Diagnosis not present

## 2024-05-01 DIAGNOSIS — M0609 Rheumatoid arthritis without rheumatoid factor, multiple sites: Secondary | ICD-10-CM | POA: Diagnosis not present

## 2024-05-01 DIAGNOSIS — M1991 Primary osteoarthritis, unspecified site: Secondary | ICD-10-CM | POA: Diagnosis not present

## 2024-05-01 DIAGNOSIS — Z6825 Body mass index (BMI) 25.0-25.9, adult: Secondary | ICD-10-CM | POA: Diagnosis not present

## 2024-06-05 ENCOUNTER — Other Ambulatory Visit: Payer: Self-pay | Admitting: Family Medicine

## 2024-06-05 DIAGNOSIS — Z1231 Encounter for screening mammogram for malignant neoplasm of breast: Secondary | ICD-10-CM

## 2024-06-11 ENCOUNTER — Other Ambulatory Visit: Payer: Self-pay | Admitting: Family Medicine

## 2024-06-11 DIAGNOSIS — N644 Mastodynia: Secondary | ICD-10-CM

## 2024-06-13 ENCOUNTER — Ambulatory Visit
Admission: RE | Admit: 2024-06-13 | Discharge: 2024-06-13 | Disposition: A | Discharge status: Home / Self Care | Source: Ambulatory Visit | Attending: Family Medicine | Admitting: Family Medicine

## 2024-06-13 ENCOUNTER — Other Ambulatory Visit

## 2024-06-13 ENCOUNTER — Other Ambulatory Visit: Payer: Self-pay | Admitting: Family Medicine

## 2024-06-13 DIAGNOSIS — N644 Mastodynia: Secondary | ICD-10-CM

## 2024-06-13 DIAGNOSIS — R928 Other abnormal and inconclusive findings on diagnostic imaging of breast: Secondary | ICD-10-CM | POA: Diagnosis not present

## 2024-06-17 DIAGNOSIS — H35372 Puckering of macula, left eye: Secondary | ICD-10-CM | POA: Diagnosis not present

## 2024-06-17 DIAGNOSIS — E113293 Type 2 diabetes mellitus with mild nonproliferative diabetic retinopathy without macular edema, bilateral: Secondary | ICD-10-CM | POA: Diagnosis not present

## 2024-06-17 DIAGNOSIS — H3554 Dystrophies primarily involving the retinal pigment epithelium: Secondary | ICD-10-CM | POA: Diagnosis not present

## 2024-06-17 DIAGNOSIS — H43813 Vitreous degeneration, bilateral: Secondary | ICD-10-CM | POA: Diagnosis not present

## 2024-06-17 DIAGNOSIS — H35033 Hypertensive retinopathy, bilateral: Secondary | ICD-10-CM | POA: Diagnosis not present

## 2024-06-20 DIAGNOSIS — E538 Deficiency of other specified B group vitamins: Secondary | ICD-10-CM | POA: Diagnosis not present

## 2024-07-11 DIAGNOSIS — M25561 Pain in right knee: Secondary | ICD-10-CM | POA: Diagnosis not present

## 2024-07-25 DIAGNOSIS — D519 Vitamin B12 deficiency anemia, unspecified: Secondary | ICD-10-CM | POA: Diagnosis not present

## 2024-08-05 ENCOUNTER — Encounter: Payer: Self-pay | Admitting: Gastroenterology

## 2024-09-03 ENCOUNTER — Ambulatory Visit: Admitting: Gastroenterology

## 2024-09-24 ENCOUNTER — Ambulatory Visit: Admitting: Gastroenterology

## 2024-10-21 ENCOUNTER — Ambulatory Visit (HOSPITAL_COMMUNITY): Admit: 2024-10-21 | Admitting: Orthopedic Surgery
